# Patient Record
Sex: Male | Born: 1935
Health system: Southern US, Community
[De-identification: ages and names within clinical notes are randomized; demographics above are authoritative.]

## PROBLEM LIST (undated history)

## (undated) DIAGNOSIS — I8390 Asymptomatic varicose veins of unspecified lower extremity: Secondary | ICD-10-CM

## (undated) DIAGNOSIS — K6389 Other specified diseases of intestine: Secondary | ICD-10-CM

## (undated) DIAGNOSIS — I10 Essential (primary) hypertension: Secondary | ICD-10-CM

## (undated) DIAGNOSIS — I2584 Coronary atherosclerosis due to calcified coronary lesion: Secondary | ICD-10-CM

## (undated) DIAGNOSIS — C8590 Non-Hodgkin lymphoma, unspecified, unspecified site: Secondary | ICD-10-CM

## (undated) DIAGNOSIS — H9190 Unspecified hearing loss, unspecified ear: Secondary | ICD-10-CM

## (undated) DIAGNOSIS — E785 Hyperlipidemia, unspecified: Secondary | ICD-10-CM

## (undated) DIAGNOSIS — K219 Gastro-esophageal reflux disease without esophagitis: Secondary | ICD-10-CM

## (undated) DIAGNOSIS — I447 Left bundle-branch block, unspecified: Principal | ICD-10-CM

## (undated) DIAGNOSIS — I2510 Atherosclerotic heart disease of native coronary artery without angina pectoris: Secondary | ICD-10-CM

## (undated) DIAGNOSIS — J189 Pneumonia, unspecified organism: Secondary | ICD-10-CM

## (undated) HISTORY — DX: Hyperlipidemia, unspecified: E78.5

## (undated) HISTORY — DX: Non-Hodgkin lymphoma, unspecified, unspecified site: C85.90

## (undated) HISTORY — DX: Coronary atherosclerosis due to calcified coronary lesion: I25.84

## (undated) HISTORY — DX: Asymptomatic varicose veins of unspecified lower extremity: I83.90

## (undated) HISTORY — DX: Left bundle-branch block, unspecified: I44.7

## (undated) HISTORY — DX: Essential (primary) hypertension: I10

## (undated) HISTORY — DX: Atherosclerotic heart disease of native coronary artery without angina pectoris: I25.10

---

## 2006-08-16 HISTORY — PX: HERNIA REPAIR: SHX51

## 2006-12-06 ENCOUNTER — Observation Stay (HOSPITAL_COMMUNITY): Admission: EM | Admit: 2006-12-06 | Discharge: 2006-12-07 | Payer: Self-pay | Admitting: Emergency Medicine

## 2006-12-06 ENCOUNTER — Encounter (INDEPENDENT_AMBULATORY_CARE_PROVIDER_SITE_OTHER): Payer: Self-pay | Admitting: *Deleted

## 2007-03-14 ENCOUNTER — Ambulatory Visit (HOSPITAL_COMMUNITY): Admission: RE | Admit: 2007-03-14 | Discharge: 2007-03-15 | Payer: Self-pay | Admitting: General Surgery

## 2007-03-14 ENCOUNTER — Encounter (INDEPENDENT_AMBULATORY_CARE_PROVIDER_SITE_OTHER): Payer: Self-pay | Admitting: General Surgery

## 2010-12-29 NOTE — H&P (Signed)
NAME:  [name], [name] NO.:  [id number]   MEDICAL RECORD NO.:  [id number]          PATIENT TYPE:  OIB   LOCATION:  [place]                         FACILITY:  [facility]   PHYSICIAN:  Orson Ape. Weatherly, M.D.DATE OF BIRTH:  19-Dec-1931   DATE OF ADMISSION:  03/14/2007  DATE OF DISCHARGE:                              HISTORY & PHYSICAL   CHIEF COMPLAINT:  Large left inguinal hernia, I am sure it is a slider.  History of hypertension.   HISTORY:  Henry Nichols is a 77 year old Caucasian male, widowed, who  presented to the emergency room with a bowel obstruction secondary to a  giant right inguinal hernia on December 06, 2006.  The patient had a  football-sized hernia that was very tense and uncomfortable on the  right.  He has a similar size hernia that was not incarcerated on the  left, and I was able to reduce the hernia in the emergency room with IV  morphine and then took him to the operating room later that day and  repaired the right inguinal hernia, which had cecum, small bowel, etc,  all into a very large football-size hernia sac area.  The floor was  completely obstruction, of course.  He has had these, he said, for most  all of his life.  We recommended that he have the left side repaired  electively at a later time.   He has done satisfactorily.  He is back caring for himself.  He is  widowed.  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   His chronic medications are unchanged.  He is on atenolol plus, a half  tablet a day for mild blood pressure.  Crestor for cholesterol, and a  baby aspirin.   He has had no previous hospitalization with the exception of the right  inguinal hernia that was repaired.   FAMILY HISTORY:  Father is deceased.  His mother has had bypass surgery,  and she is still living.  He has one sister living.   ALLERGIES:  He denies any.   PAST MEDICAL HISTORY:  Please refer to the old chart.  Nothing has  changed.   PHYSICAL EXAMINATION:   VITAL SIGNS:  Unremarkable.  He is an elderly male who appears his stated age but in no acute  distress.  He appears adequately hydrated.  LUNGS:  Clear.  CARDIAC:  Normal sinus rhythm.  ABDOMEN:  He has a lax abdominal wall.  It is soft.  There is no  evidence of an umbilical hernia.  He has a well-healed right inguinal  hernia incision.  I do not feel any evidence of a right inguinal hernia.  Skin on the scrotum is still large on the right.  On the left is an  obviously football-sized hernia that is all down into the scrotum.  I  could not reduce it in the preop area.  He can manually reduce it if he  pushes on it too hard, but we will reduce it in the operating room.  RECTAL:  Not done today.   IMPRESSION:  1. Large left inguinal hernia, probably a slider.  2. Mild hypertension.   PLAN:  We will repair the left inguinal hernia with general anesthesia.  I am going to put a Foley catheter.  His potassium is 2.9.  He had a  bottle of mag citrate yesterday and cleansing out his colon.  This  possibly contributed to that.  He is not a potassium supplement.  He has  also got a very slightly elevated bilirubin.  His other liver tests are  normal, and he has not had any imaging of his liver.           ______________________________  Orson Ape. [name], M.D.     WJW/MEDQ  D:  03/14/2007  T:  03/14/2007  Job:  [id number]

## 2010-12-29 NOTE — Op Note (Signed)
NAME:  Henry Nichols, Henry Nichols NO.:  [id number]   MEDICAL RECORD NO.:  [id number]          PATIENT TYPE:  OIB   LOCATION:  [place]                         FACILITY:  [facility]   PHYSICIAN:  Orson Ape. Weatherly, M.D.DATE OF BIRTH:  1931/12/07   DATE OF PROCEDURE:  03/14/2007  DATE OF DISCHARGE:                               OPERATIVE REPORT   PREOPERATIVE DIAGNOSIS:  Large left inguinal hernia, probably a slider.   POSTOPERATIVE DIAGNOSIS:  Large left inguinal hernia, a slider.   OPERATION:  Repair, left inguinal hernia.   General anesthesia.   SURGEON:  Orson Ape. [name], M.D., assisted by the nurse.   HISTORY:  Henry Nichols is a 77 year old male that has had bilateral  inguinal hernias most of his life.  He presented to the emergency room  in April with an incarceration of the right inguinal hernia that was  reduced and then repaired electively.  I elected to wait on the left  inguinal hernia until an elective time and he is here now for that  planned procedure.  He has had no guaiac-positive stools.  I do not  think he has had a colonoscopy, and he had a bottle of magnesium citrate  yesterday in preparation for the hernia repair.   Preoperatively was given a gram of Ancef, and plan on placing a Foley.  He was taken back to the operating room, induction of general anesthesia  and endotracheal tube.  The hernia, I have tried to reduce it with him  just asleep but it still not reducible, and we prepped the area widely  with Betadine solution and put a Foley catheter in the bladder.  The  area where the incision we made was clipped and it was then prepped and  draped in a sterile manner.  I used about 10 mL of Marcaine with  adrenalin and anesthetized the ilioinguinal nerve initially and then  made the inguinal incision.  With the large mass it is somewhat hard to  identify the actual symphysis pubis and certainly cannot feel the  inguinal ligament.  Sharp dissection  down through the skin and  subcutaneous tissue, everything is all distorted with this large hernia.  There were two superficial veins that we identified, clamped, ligated  with fine Vicryl, and then trying to get around the cord structures, the  inguinal ligament was identified and the external oblique.  I opened  this.  Still you could reduce the area and I went and dissected on down,  identifying the hernia sac, and then opened it and then there was  probably 2 feet of colon down into the hernia.  This is a definite  slider.  I then could get a Penrose drain around the cord structures and  then first we kind of separated the colon and the posterior hernia sac  so I could flip that intra-abdominally and then extraperitonealized the  vascular structures to the colon.  This then gave [place] an internal ring  area that was not all that large, and this was repaired with a running 2-  0 Vicryl, of course, everything open.  Next I repaired the floor with a  running 2-0 Prolene, kind of modified Shouldice repair, and then  recreated the internal ring and then took the area back down, tying the  two ends together.  A piece of Prolene mesh shaped like a sail slit  laterally was then used to reinforce the floor.  The inferior portion  was sutured with a running 2-0 Vicryl.  The two tails were sutured  together and I made sure that they did not actually constrict the cord  structures since it is such a large area.  Most the cremaster muscle  fibers and all had been taken down and hemostasis was obtained with 4-0  Vicryl ties for this.  The testicle was definitely down deep in the  scrotum at the completion of surgery.  The superior limb of the Prolene  mesh was sutured down with interrupted 2-0 Prolenes.  We were up under  the external oblique and the mesh is lying flat, not excessively tight.  Next the external oblique was closed with a running 2-0 Vicryl.  The  ilioinguinal nerve had been placed with  the cord structures.  The  iliohypogastric nerve I never visualized.  The Scarpa fascia was closed  with interrupted 3-0 Vicryl and 4-0 Dexon  subcuticular and Benzoin and Steri-Strips on the skin.  A sterile  occlusive dressing was applied.  We will keep the Foley in place and,  hopefully, the patient will be able to be discharged in the a.m.  I may  remove his Foley this evening.  I may wait until the morning with how  much pain he is having postoperatively.           ______________________________  Orson Ape. [name], M.D.     WJW/MEDQ  D:  03/14/2007  T:  03/15/2007  Job:  [id number]   cc:   Azalia Bilis, M.D.  Fax: [phone]

## 2011-01-01 NOTE — H&P (Signed)
NAME:  Henry, Nichols NO.:  [id number]   MEDICAL RECORD NO.:  [id number]          PATIENT TYPE:  INP   LOCATION:  0098                         FACILITY:  [facility]   PHYSICIAN:  Orson Ape. Weatherly, M.D.DATE OF BIRTH:  [birth year]   DATE OF ADMISSION:  12/06/2006  DATE OF DISCHARGE:                              HISTORY & PHYSICAL   CHIEF COMPLAINT:  Severe pain in the right groin.   HISTORY:  Henry Nichols is a 77 year old widowed Caucasian male who  came to the emergency room today with severe pain of several hours'  duration in the right scrotum.  The patient has had giant bilateral  hernias for many, many years.  His family have requested that he have  these surgically corrected.  The patient said he has just had a fear of  hospitalization and has not proceeded.  He sees some medical doctor, he  did not give me his name, but said that his medical doctor does not know  anything about his hernias.  He is on Atenolol for high blood pressure  and he has an elevated cholesterol.  The patient is retired; I am not  sure what type of work he used to do, but in the emergency room, he had  a small football-sized hernia that was very tense and very painful in  the right groin and I was called.  We gave him IV morphine and I was  able with manual pressure on the scrotum to get the right inguinal  hernia reduced.  His pain greatly decreased afterwards and then the left  inguinal hernia that is not quite as large, but of similar size, the  patient could reduce that one.  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  He went home and tried to reduce  it, was not able to reduce it and that is when he was brought to the  emergency room by his son.  The patient says he has never been in the  hospital where he has never been in as a patient and of course, he was  never had any surgery.   After the hernia was reduced, we of course had started IV  fluid.  I sent  him over for chest x-ray and a flat and upright abdominal film.  There  was a dilated loop of small bowel, but no free air and the patient was  basically essentially pain-free after the hernia was reduced.  His white  count in the emergency room was 18,100 and I recommended that we proceed  on with a right inguinal herniorrhaphy as an urgent patient and the  patient is in agreement.   CHRONIC MEDICATIONS:  1. Atenolol plus one-half tablet in the morning.  2. Crestor.  3. Baby aspirin each day.   SOCIAL HISTORY:  The patient is widowed and his son was with him in the  emergency room.   PHYSICAL EXAM:  VITAL SIGNS:  When the patient first presented, he was  afebrile at 97.3, pulse of 70, respirations 20, blood pressure 149/79.  GENERAL:  He is an elderly male who appears older than his stated age  and was in severe pain when he first presented.  Afterwards, he was kind  of jovial and not complaining of any pain.  LUNGS:  Clear.  CARDIAC:  Normal sinus rhythm.  BREASTS:  Negative.  ABDOMEN:  He is kind of full, but has bowel sounds.  He is not acutely  tender in the upper abdomen.  RECTAL:  I did not do a rectal exam on him.  GU:  He denies difficulty voiding.  EXTREMITIES:  There is no pedal edema.  CNS:  Grossly physiologic.   ADMISSION IMPRESSION:  1. Giant bilateral inguinal hernias; the right one was incarcerated,      but we reduced it; the left one is large, but he could manually      reduce it.  2. The patient has a history of mild hypertension.   PLAN:  We will proceed with a right inguinal hernia repair under general  anesthesia this evening.  I have talked with his sister that if he were  to have any ischemic bowel, we may have to do a laparotomy, but the way  his clinical findings are, I think that is unlikely.  I would like to  wait and do the left inguinal hernia after probably about 6 weeks to  give the right side evidence to heal, since he has  got such big inguinal  hernias.           ______________________________  Orson Ape. [name], M.D.     WJW/MEDQ  D:  12/06/2006  T:  12/07/2006  Job:  [id number]

## 2011-01-01 NOTE — Op Note (Signed)
NAME:  Henry Nichols, Henry Nichols NO.:  [id number]   MEDICAL RECORD NO.:  [id number]          PATIENT TYPE:  INP   LOCATION:  0098                         FACILITY:  [facility]   PHYSICIAN:  Orson Ape. Weatherly, M.D.DATE OF BIRTH:  11-26-1931   DATE OF PROCEDURE:  12/06/2006  DATE OF DISCHARGE:                               OPERATIVE REPORT   PREOPERATIVE DIAGNOSIS:  Bilateral inguinal hernia.  The right one was  incarcerated and I just previously reduced it.   OPERATION:  Right inguinal herniorrhaphy with mesh reinforcement.   ANESTHESIA:  General anesthesia.   HISTORY:  Henry Nichols is a 77 year old male who has had bilateral  giant hernias many, many years.  He has never had any surgery or  hospitalization.  The hernia became incarcerated today and he came to  the emergency room with his son.  I was able to manually reduce it with  some difficulty after giving him intravenous morphine.  The patient is  able to reduce the left inguinal hernia that is nearly of equal size and  I have recommended we repair the right inguinal hernia at this time and  then do the left inguinal hernia had a later time, hopefully about six  weeks from now.   Preop he was given a gram of Ancef and taken to the operative suite.  Induction of general anesthesia endotracheal tube, later oral tube was  placed in the stomach but there was little contents and the tumor was  removed afterwards.  I did place a Foley catheter and he has got good  urine output.  The left and right inguinal areas were shaved or clipped  and also the midline and then he was prepped widely with Betadine  solution and draped in a sterile manner.  The inguinal incision was made  through the skin and subcutaneous tissue and a couple of veins were  identified and these were clamped, ligated with fine Vicryl and then the  hernia sac which had really destroyed the external ring because it is so  big was kind of separated so I  could get a Penrose around the complete  structures.  Next the cremaster was opened and the hernia sac was  separated from the cord structures.  The sac was opened, the small bowel  was reduced and then the sac which was probably 8 inches or longer was  completely separated right on up to the internal ring and high sac  ligation was performed under direct vision with two sutures of 2-0 silk.  The hernia sac removed and retracted up in the preperitoneal space  nicely and then the floor which was reinforced with a 0-0 Prolene  starting at symphysis pubis, recreating the internal ring going back  tying the two ends together.  Next a piece of Prolene mesh shaped like a  sail slit laterally was used to reinforce the floor scar with the  inguinal ligament suturing it with another 0-0 Prolene.  The two tails  were sutured together laterally.  The  ilioinguinal nerve had been  protected and brought out with the cord structures.  The superior flap  was sutured down with interrupted sutures of 0-0 Prolene and also some 2-  0 Vicryl and then the external oblique was closed, kind of reefing it up  laterally where was so stretched partially creating an external ring.  Scarpa's fascia was closed with interrupted 2-0 Vicryl, 3-0 Vicryl, and  a 4-0 Dexon used subcuticular, Benzoin and Steri-Strips on the skin.  The testicle was in its normal position.  The scrotum was still swollen  but a lot less and hopefully he will not get testicular swelling.  I  went to the opposite side and kind of reduce the hernia.  It prolapses  back out with him straining as he is being extubated, but hopefully this  will not become incarcerated and we can repair it electively probably in  4-6 weeks.  We will leave the Foley catheter  in and we will reassess in the morning whether we can remove the Foley  or whether if he is having any symptoms might leave the Foley another 24  hours.  The patient lives alone but I think he is  going to say with his  son, though I want to make sure that he is doing satisfactory before he  is released.           ______________________________  Orson Ape. [name], M.D.     WJW/MEDQ  D:  12/06/2006  T:  12/07/2006  Job:  [id number]

## 2011-05-31 LAB — COMPREHENSIVE METABOLIC PANEL
ALT: 23
AST: 29
Albumin: 2.9 — ABNORMAL LOW
Albumin: 3.9
Alkaline Phosphatase: 54
BUN: 14
BUN: 9
Calcium: 8.3 — ABNORMAL LOW
Chloride: 98
Creatinine, Ser: 1.19
Glucose, Bld: 158 — ABNORMAL HIGH
Potassium: 2.9 — ABNORMAL LOW
Sodium: 141
Total Bilirubin: 2.3 — ABNORMAL HIGH
Total Protein: 5.7 — ABNORMAL LOW

## 2011-05-31 LAB — DIFFERENTIAL
Basophils Relative: 1
Eosinophils Absolute: 0.7
Eosinophils Relative: 9 — ABNORMAL HIGH
Lymphs Abs: 3.4 — ABNORMAL HIGH
Monocytes Absolute: 0.6
Monocytes Relative: 7
Neutrophils Relative %: 40 — ABNORMAL LOW

## 2011-05-31 LAB — CBC
Hemoglobin: 13.3
RDW: 13.6

## 2011-12-02 DIAGNOSIS — L259 Unspecified contact dermatitis, unspecified cause: Secondary | ICD-10-CM | POA: Diagnosis not present

## 2011-12-02 DIAGNOSIS — Z125 Encounter for screening for malignant neoplasm of prostate: Secondary | ICD-10-CM | POA: Diagnosis not present

## 2011-12-02 DIAGNOSIS — J309 Allergic rhinitis, unspecified: Secondary | ICD-10-CM | POA: Diagnosis not present

## 2011-12-02 DIAGNOSIS — E785 Hyperlipidemia, unspecified: Secondary | ICD-10-CM | POA: Diagnosis not present

## 2011-12-02 DIAGNOSIS — I10 Essential (primary) hypertension: Secondary | ICD-10-CM | POA: Diagnosis not present

## 2012-06-01 DIAGNOSIS — I8310 Varicose veins of unspecified lower extremity with inflammation: Secondary | ICD-10-CM | POA: Diagnosis not present

## 2012-06-01 DIAGNOSIS — I10 Essential (primary) hypertension: Secondary | ICD-10-CM | POA: Diagnosis not present

## 2012-06-01 DIAGNOSIS — Z23 Encounter for immunization: Secondary | ICD-10-CM | POA: Diagnosis not present

## 2012-06-01 DIAGNOSIS — J309 Allergic rhinitis, unspecified: Secondary | ICD-10-CM | POA: Diagnosis not present

## 2012-06-01 DIAGNOSIS — E785 Hyperlipidemia, unspecified: Secondary | ICD-10-CM | POA: Diagnosis not present

## 2012-11-30 DIAGNOSIS — J309 Allergic rhinitis, unspecified: Secondary | ICD-10-CM | POA: Diagnosis not present

## 2012-11-30 DIAGNOSIS — I10 Essential (primary) hypertension: Secondary | ICD-10-CM | POA: Diagnosis not present

## 2012-11-30 DIAGNOSIS — E785 Hyperlipidemia, unspecified: Secondary | ICD-10-CM | POA: Diagnosis not present

## 2013-06-01 DIAGNOSIS — J309 Allergic rhinitis, unspecified: Secondary | ICD-10-CM | POA: Diagnosis not present

## 2013-06-01 DIAGNOSIS — Z23 Encounter for immunization: Secondary | ICD-10-CM | POA: Diagnosis not present

## 2013-06-01 DIAGNOSIS — I10 Essential (primary) hypertension: Secondary | ICD-10-CM | POA: Diagnosis not present

## 2013-06-01 DIAGNOSIS — J449 Chronic obstructive pulmonary disease, unspecified: Secondary | ICD-10-CM | POA: Diagnosis not present

## 2013-06-01 DIAGNOSIS — H547 Unspecified visual loss: Secondary | ICD-10-CM | POA: Diagnosis not present

## 2013-06-01 DIAGNOSIS — E785 Hyperlipidemia, unspecified: Secondary | ICD-10-CM | POA: Diagnosis not present

## 2013-11-30 DIAGNOSIS — Z23 Encounter for immunization: Secondary | ICD-10-CM | POA: Diagnosis not present

## 2013-11-30 DIAGNOSIS — I10 Essential (primary) hypertension: Secondary | ICD-10-CM | POA: Diagnosis not present

## 2013-11-30 DIAGNOSIS — J449 Chronic obstructive pulmonary disease, unspecified: Secondary | ICD-10-CM | POA: Diagnosis not present

## 2013-11-30 DIAGNOSIS — J309 Allergic rhinitis, unspecified: Secondary | ICD-10-CM | POA: Diagnosis not present

## 2013-11-30 DIAGNOSIS — E785 Hyperlipidemia, unspecified: Secondary | ICD-10-CM | POA: Diagnosis not present

## 2014-02-06 DIAGNOSIS — H18419 Arcus senilis, unspecified eye: Secondary | ICD-10-CM | POA: Diagnosis not present

## 2014-02-06 DIAGNOSIS — H25049 Posterior subcapsular polar age-related cataract, unspecified eye: Secondary | ICD-10-CM | POA: Diagnosis not present

## 2014-02-06 DIAGNOSIS — H2510 Age-related nuclear cataract, unspecified eye: Secondary | ICD-10-CM | POA: Diagnosis not present

## 2014-02-06 DIAGNOSIS — H25019 Cortical age-related cataract, unspecified eye: Secondary | ICD-10-CM | POA: Diagnosis not present

## 2014-03-05 DIAGNOSIS — H2510 Age-related nuclear cataract, unspecified eye: Secondary | ICD-10-CM | POA: Diagnosis not present

## 2014-03-05 DIAGNOSIS — H269 Unspecified cataract: Secondary | ICD-10-CM | POA: Diagnosis not present

## 2014-03-06 DIAGNOSIS — H2510 Age-related nuclear cataract, unspecified eye: Secondary | ICD-10-CM | POA: Diagnosis not present

## 2014-03-12 DIAGNOSIS — H2510 Age-related nuclear cataract, unspecified eye: Secondary | ICD-10-CM | POA: Diagnosis not present

## 2014-03-12 DIAGNOSIS — H269 Unspecified cataract: Secondary | ICD-10-CM | POA: Diagnosis not present

## 2014-05-31 DIAGNOSIS — J449 Chronic obstructive pulmonary disease, unspecified: Secondary | ICD-10-CM | POA: Diagnosis not present

## 2014-05-31 DIAGNOSIS — J309 Allergic rhinitis, unspecified: Secondary | ICD-10-CM | POA: Diagnosis not present

## 2014-05-31 DIAGNOSIS — Z23 Encounter for immunization: Secondary | ICD-10-CM | POA: Diagnosis not present

## 2014-05-31 DIAGNOSIS — E780 Pure hypercholesterolemia: Secondary | ICD-10-CM | POA: Diagnosis not present

## 2014-05-31 DIAGNOSIS — I10 Essential (primary) hypertension: Secondary | ICD-10-CM | POA: Diagnosis not present

## 2014-08-16 HISTORY — PX: EYE SURGERY: SHX253

## 2014-08-23 DIAGNOSIS — N390 Urinary tract infection, site not specified: Secondary | ICD-10-CM | POA: Diagnosis not present

## 2014-12-04 DIAGNOSIS — J309 Allergic rhinitis, unspecified: Secondary | ICD-10-CM | POA: Diagnosis not present

## 2014-12-04 DIAGNOSIS — I10 Essential (primary) hypertension: Secondary | ICD-10-CM | POA: Diagnosis not present

## 2014-12-04 DIAGNOSIS — J449 Chronic obstructive pulmonary disease, unspecified: Secondary | ICD-10-CM | POA: Diagnosis not present

## 2014-12-04 DIAGNOSIS — E780 Pure hypercholesterolemia: Secondary | ICD-10-CM | POA: Diagnosis not present

## 2015-06-04 DIAGNOSIS — H353132 Nonexudative age-related macular degeneration, bilateral, intermediate dry stage: Secondary | ICD-10-CM | POA: Diagnosis not present

## 2015-06-05 DIAGNOSIS — J309 Allergic rhinitis, unspecified: Secondary | ICD-10-CM | POA: Diagnosis not present

## 2015-06-05 DIAGNOSIS — I10 Essential (primary) hypertension: Secondary | ICD-10-CM | POA: Diagnosis not present

## 2015-06-05 DIAGNOSIS — Z23 Encounter for immunization: Secondary | ICD-10-CM | POA: Diagnosis not present

## 2015-06-05 DIAGNOSIS — J449 Chronic obstructive pulmonary disease, unspecified: Secondary | ICD-10-CM | POA: Diagnosis not present

## 2015-06-05 DIAGNOSIS — E786 Lipoprotein deficiency: Secondary | ICD-10-CM | POA: Diagnosis not present

## 2015-06-05 DIAGNOSIS — L309 Dermatitis, unspecified: Secondary | ICD-10-CM | POA: Diagnosis not present

## 2015-08-14 DIAGNOSIS — H353131 Nonexudative age-related macular degeneration, bilateral, early dry stage: Secondary | ICD-10-CM | POA: Diagnosis not present

## 2015-08-14 DIAGNOSIS — H26499 Other secondary cataract, unspecified eye: Secondary | ICD-10-CM | POA: Diagnosis not present

## 2015-09-29 DIAGNOSIS — H26491 Other secondary cataract, right eye: Secondary | ICD-10-CM | POA: Diagnosis not present

## 2015-10-02 ENCOUNTER — Encounter (INDEPENDENT_AMBULATORY_CARE_PROVIDER_SITE_OTHER): Payer: Medicare Other | Admitting: Ophthalmology

## 2015-10-02 DIAGNOSIS — H26492 Other secondary cataract, left eye: Secondary | ICD-10-CM

## 2015-10-02 DIAGNOSIS — H35033 Hypertensive retinopathy, bilateral: Secondary | ICD-10-CM | POA: Diagnosis not present

## 2015-10-02 DIAGNOSIS — H43813 Vitreous degeneration, bilateral: Secondary | ICD-10-CM | POA: Diagnosis not present

## 2015-10-02 DIAGNOSIS — I10 Essential (primary) hypertension: Secondary | ICD-10-CM

## 2015-10-02 DIAGNOSIS — H353132 Nonexudative age-related macular degeneration, bilateral, intermediate dry stage: Secondary | ICD-10-CM

## 2015-10-17 ENCOUNTER — Ambulatory Visit (INDEPENDENT_AMBULATORY_CARE_PROVIDER_SITE_OTHER): Payer: Medicare Other | Admitting: Ophthalmology

## 2015-10-17 DIAGNOSIS — H2702 Aphakia, left eye: Secondary | ICD-10-CM

## 2015-12-05 DIAGNOSIS — E7800 Pure hypercholesterolemia, unspecified: Secondary | ICD-10-CM | POA: Diagnosis not present

## 2015-12-05 DIAGNOSIS — I10 Essential (primary) hypertension: Secondary | ICD-10-CM | POA: Diagnosis not present

## 2015-12-05 DIAGNOSIS — L309 Dermatitis, unspecified: Secondary | ICD-10-CM | POA: Diagnosis not present

## 2015-12-05 DIAGNOSIS — J309 Allergic rhinitis, unspecified: Secondary | ICD-10-CM | POA: Diagnosis not present

## 2015-12-05 DIAGNOSIS — J449 Chronic obstructive pulmonary disease, unspecified: Secondary | ICD-10-CM | POA: Diagnosis not present

## 2015-12-11 DIAGNOSIS — E7800 Pure hypercholesterolemia, unspecified: Secondary | ICD-10-CM | POA: Diagnosis not present

## 2015-12-11 DIAGNOSIS — I10 Essential (primary) hypertension: Secondary | ICD-10-CM | POA: Diagnosis not present

## 2016-01-20 DIAGNOSIS — N183 Chronic kidney disease, stage 3 (moderate): Secondary | ICD-10-CM | POA: Diagnosis not present

## 2016-06-07 DIAGNOSIS — J309 Allergic rhinitis, unspecified: Secondary | ICD-10-CM | POA: Diagnosis not present

## 2016-06-07 DIAGNOSIS — L309 Dermatitis, unspecified: Secondary | ICD-10-CM | POA: Diagnosis not present

## 2016-06-07 DIAGNOSIS — E7800 Pure hypercholesterolemia, unspecified: Secondary | ICD-10-CM | POA: Diagnosis not present

## 2016-06-07 DIAGNOSIS — Z23 Encounter for immunization: Secondary | ICD-10-CM | POA: Diagnosis not present

## 2016-06-07 DIAGNOSIS — J449 Chronic obstructive pulmonary disease, unspecified: Secondary | ICD-10-CM | POA: Diagnosis not present

## 2016-06-07 DIAGNOSIS — N183 Chronic kidney disease, stage 3 (moderate): Secondary | ICD-10-CM | POA: Diagnosis not present

## 2016-06-07 DIAGNOSIS — I10 Essential (primary) hypertension: Secondary | ICD-10-CM | POA: Diagnosis not present

## 2016-10-19 ENCOUNTER — Ambulatory Visit (INDEPENDENT_AMBULATORY_CARE_PROVIDER_SITE_OTHER): Payer: Medicare Other | Admitting: Ophthalmology

## 2016-10-19 DIAGNOSIS — I10 Essential (primary) hypertension: Secondary | ICD-10-CM | POA: Diagnosis not present

## 2016-10-19 DIAGNOSIS — H35371 Puckering of macula, right eye: Secondary | ICD-10-CM

## 2016-10-19 DIAGNOSIS — H35033 Hypertensive retinopathy, bilateral: Secondary | ICD-10-CM | POA: Diagnosis not present

## 2016-10-19 DIAGNOSIS — H353131 Nonexudative age-related macular degeneration, bilateral, early dry stage: Secondary | ICD-10-CM | POA: Diagnosis not present

## 2016-10-19 DIAGNOSIS — H43813 Vitreous degeneration, bilateral: Secondary | ICD-10-CM | POA: Diagnosis not present

## 2016-12-06 DIAGNOSIS — I872 Venous insufficiency (chronic) (peripheral): Secondary | ICD-10-CM | POA: Diagnosis not present

## 2016-12-06 DIAGNOSIS — J309 Allergic rhinitis, unspecified: Secondary | ICD-10-CM | POA: Diagnosis not present

## 2016-12-06 DIAGNOSIS — I10 Essential (primary) hypertension: Secondary | ICD-10-CM | POA: Diagnosis not present

## 2016-12-06 DIAGNOSIS — R600 Localized edema: Secondary | ICD-10-CM | POA: Diagnosis not present

## 2016-12-06 DIAGNOSIS — J449 Chronic obstructive pulmonary disease, unspecified: Secondary | ICD-10-CM | POA: Diagnosis not present

## 2016-12-06 DIAGNOSIS — E7800 Pure hypercholesterolemia, unspecified: Secondary | ICD-10-CM | POA: Diagnosis not present

## 2017-03-14 DIAGNOSIS — N390 Urinary tract infection, site not specified: Secondary | ICD-10-CM | POA: Diagnosis not present

## 2017-03-14 DIAGNOSIS — K529 Noninfective gastroenteritis and colitis, unspecified: Secondary | ICD-10-CM | POA: Diagnosis not present

## 2017-03-14 DIAGNOSIS — R1013 Epigastric pain: Secondary | ICD-10-CM | POA: Diagnosis not present

## 2017-03-28 DIAGNOSIS — N289 Disorder of kidney and ureter, unspecified: Secondary | ICD-10-CM | POA: Diagnosis not present

## 2017-06-08 ENCOUNTER — Other Ambulatory Visit: Payer: Self-pay | Admitting: Family Medicine

## 2017-06-08 DIAGNOSIS — I872 Venous insufficiency (chronic) (peripheral): Secondary | ICD-10-CM | POA: Diagnosis not present

## 2017-06-08 DIAGNOSIS — E7800 Pure hypercholesterolemia, unspecified: Secondary | ICD-10-CM | POA: Diagnosis not present

## 2017-06-08 DIAGNOSIS — J449 Chronic obstructive pulmonary disease, unspecified: Secondary | ICD-10-CM | POA: Diagnosis not present

## 2017-06-08 DIAGNOSIS — J309 Allergic rhinitis, unspecified: Secondary | ICD-10-CM | POA: Diagnosis not present

## 2017-06-08 DIAGNOSIS — I10 Essential (primary) hypertension: Secondary | ICD-10-CM | POA: Diagnosis not present

## 2017-06-08 DIAGNOSIS — R600 Localized edema: Secondary | ICD-10-CM | POA: Diagnosis not present

## 2017-06-08 DIAGNOSIS — R1013 Epigastric pain: Secondary | ICD-10-CM | POA: Diagnosis not present

## 2017-06-08 DIAGNOSIS — Z1389 Encounter for screening for other disorder: Secondary | ICD-10-CM | POA: Diagnosis not present

## 2017-06-08 DIAGNOSIS — Z23 Encounter for immunization: Secondary | ICD-10-CM | POA: Diagnosis not present

## 2017-06-09 ENCOUNTER — Ambulatory Visit
Admission: RE | Admit: 2017-06-09 | Discharge: 2017-06-09 | Disposition: A | Discharge status: Home / Self Care | Payer: Medicare Other | Source: Ambulatory Visit | Attending: Family Medicine | Admitting: Family Medicine

## 2017-06-09 ENCOUNTER — Other Ambulatory Visit: Payer: Self-pay | Admitting: Family Medicine

## 2017-06-09 DIAGNOSIS — R1013 Epigastric pain: Secondary | ICD-10-CM

## 2017-06-09 DIAGNOSIS — K8020 Calculus of gallbladder without cholecystitis without obstruction: Secondary | ICD-10-CM | POA: Diagnosis not present

## 2017-06-09 HISTORY — DX: Essential (primary) hypertension: I10

## 2017-06-09 MED ORDER — IOPAMIDOL (ISOVUE-300) INJECTION 61%
100.0000 mL | Freq: Once | INTRAVENOUS | Status: AC | PRN
  Administered 2017-06-09: 100 mL via INTRAVENOUS

## 2017-06-13 ENCOUNTER — Other Ambulatory Visit: Payer: Self-pay | Admitting: Family Medicine

## 2017-06-13 DIAGNOSIS — R935 Abnormal findings on diagnostic imaging of other abdominal regions, including retroperitoneum: Secondary | ICD-10-CM

## 2017-06-14 ENCOUNTER — Ambulatory Visit
Admission: RE | Admit: 2017-06-14 | Discharge: 2017-06-14 | Disposition: A | Discharge status: Home / Self Care | Payer: Medicare Other | Source: Ambulatory Visit | Attending: Family Medicine | Admitting: Family Medicine

## 2017-06-14 DIAGNOSIS — R935 Abnormal findings on diagnostic imaging of other abdominal regions, including retroperitoneum: Secondary | ICD-10-CM

## 2017-06-14 DIAGNOSIS — R918 Other nonspecific abnormal finding of lung field: Secondary | ICD-10-CM | POA: Diagnosis not present

## 2017-06-14 MED ORDER — IOPAMIDOL (ISOVUE-300) INJECTION 61%
75.0000 mL | Freq: Once | INTRAVENOUS | Status: AC | PRN
  Administered 2017-06-14: 75 mL via INTRAVENOUS

## 2017-06-27 ENCOUNTER — Other Ambulatory Visit: Payer: Self-pay | Admitting: Surgery

## 2017-06-27 DIAGNOSIS — K6389 Other specified diseases of intestine: Secondary | ICD-10-CM | POA: Diagnosis not present

## 2017-06-28 NOTE — Pre-Procedure Instructions (Signed)
Henry Nichols  06/28/2017      [facility] - [address] AT [facility] OF [address] [place] [address] Phone: [phone] Fax: [phone]    Your procedure is scheduled on July 06, 2017.  Report to [facility] Admitting at 1030 AM.  Call this number if you have problems the morning of surgery:  [phone]   Remember:  Do not eat food or drink liquids after midnight.  Take these medicines the morning of surgery with A SIP OF WATER albuterol inhaler (bring inhaler with you), pantoprazole (protonix).    7 days prior to surgery STOP taking any Aspirin (unless otherwise instructed by your surgeon), Aleve, Naproxen, Ibuprofen, Motrin, Advil, Goody's, BC's, all herbal medications, fish oil, and all vitamins  Continue all other medications as instructed by your physician except follow the above medication instructions before surgery   Do not wear jewelry.  Do not wear lotions, powders, or colognes, or deodorant.   Men may shave face and neck.  Do not bring valuables to the hospital.  [facility] is not responsible for any belongings or valuables.  Contacts, dentures or bridgework may not be worn into surgery.  Leave your suitcase in the car.  After surgery it may be brought to your room.  For patients admitted to the hospital, discharge time will be determined by your treatment team.  Patients discharged the day of surgery will not be allowed to drive home.   Special instructions:   [place]- Preparing For Surgery  Before surgery, you can play an important role. Because skin is not sterile, your skin needs to be as free of germs as possible. You can reduce the number of germs on your skin by washing with CHG (chlorahexidine gluconate) Soap before surgery.  CHG is an antiseptic cleaner which kills germs and bonds with the skin to continue killing germs even after washing.  Please do not use  if you have an allergy to CHG or antibacterial soaps. If your skin becomes reddened/irritated stop using the CHG.  Do not shave (including legs and underarms) for at least 48 hours prior to first CHG shower. It is OK to shave your face.  Please follow these instructions carefully.   1. Shower the NIGHT BEFORE SURGERY and the MORNING OF SURGERY with CHG.   2. If you chose to wash your hair, wash your hair first as usual with your normal shampoo.  3. After you shampoo, rinse your hair and body thoroughly to remove the shampoo.  4. Use CHG as you would any other liquid soap. You can apply CHG directly to the skin and wash gently with a scrungie or a clean washcloth.   5. Apply the CHG Soap to your body ONLY FROM THE NECK DOWN.  Do not use on open wounds or open sores. Avoid contact with your eyes, ears, mouth and genitals (private parts). Wash Face and genitals (private parts)  with your normal soap.  6. Wash thoroughly, paying special attention to the area where your surgery will be performed.  7. Thoroughly rinse your body with warm water from the neck down.  8. DO NOT shower/wash with your normal soap after using and rinsing off the CHG Soap.  9. Pat yourself dry with a CLEAN TOWEL.  10. Wear CLEAN PAJAMAS to bed the night before surgery, wear comfortable clothes the morning of surgery  11. Place CLEAN SHEETS  on your bed the night of your first shower and DO NOT SLEEP WITH PETS.    Day of Surgery: Do not apply any deodorants/lotions. Please wear clean clothes to the hospital/surgery center.     Please read over the following fact sheets that you were given. Pain Booklet, Coughing and Deep Breathing and Surgical Site Infection Prevention

## 2017-06-29 ENCOUNTER — Encounter (HOSPITAL_COMMUNITY): Payer: Self-pay

## 2017-06-29 ENCOUNTER — Encounter (HOSPITAL_COMMUNITY)
Admission: RE | Admit: 2017-06-29 | Discharge: 2017-06-29 | Disposition: A | Discharge status: Home / Self Care | Payer: Medicare Other | Source: Ambulatory Visit | Attending: Surgery | Admitting: Surgery

## 2017-06-29 ENCOUNTER — Other Ambulatory Visit: Payer: Self-pay

## 2017-06-29 DIAGNOSIS — K639 Disease of intestine, unspecified: Secondary | ICD-10-CM | POA: Insufficient documentation

## 2017-06-29 DIAGNOSIS — I10 Essential (primary) hypertension: Secondary | ICD-10-CM | POA: Insufficient documentation

## 2017-06-29 DIAGNOSIS — I447 Left bundle-branch block, unspecified: Secondary | ICD-10-CM | POA: Diagnosis not present

## 2017-06-29 DIAGNOSIS — Z8701 Personal history of pneumonia (recurrent): Secondary | ICD-10-CM | POA: Diagnosis not present

## 2017-06-29 DIAGNOSIS — Z01812 Encounter for preprocedural laboratory examination: Secondary | ICD-10-CM | POA: Diagnosis not present

## 2017-06-29 DIAGNOSIS — K219 Gastro-esophageal reflux disease without esophagitis: Secondary | ICD-10-CM | POA: Insufficient documentation

## 2017-06-29 DIAGNOSIS — Z01810 Encounter for preprocedural cardiovascular examination: Secondary | ICD-10-CM | POA: Insufficient documentation

## 2017-06-29 HISTORY — DX: Gastro-esophageal reflux disease without esophagitis: K21.9

## 2017-06-29 HISTORY — DX: Pneumonia, unspecified organism: J18.9

## 2017-06-29 LAB — CBC
HCT: 34.7 % — ABNORMAL LOW (ref 39.0–52.0)
Hemoglobin: 11.4 g/dL — ABNORMAL LOW (ref 13.0–17.0)
MCH: 31.1 pg (ref 26.0–34.0)
MCHC: 32.9 g/dL (ref 30.0–36.0)
MCV: 94.6 fL (ref 78.0–100.0)
PLATELETS: 207 10*3/uL (ref 150–400)
RBC: 3.67 MIL/uL — ABNORMAL LOW (ref 4.22–5.81)
RDW: 14.2 % (ref 11.5–15.5)
WBC: 4.3 10*3/uL (ref 4.0–10.5)

## 2017-06-29 LAB — BASIC METABOLIC PANEL
Anion gap: 7 (ref 5–15)
BUN: 12 mg/dL (ref 6–20)
CALCIUM: 8.9 mg/dL (ref 8.9–10.3)
CO2: 28 mmol/L (ref 22–32)
CREATININE: 1.14 mg/dL (ref 0.61–1.24)
Chloride: 103 mmol/L (ref 101–111)
GFR, EST NON AFRICAN AMERICAN: 58 mL/min — AB (ref >60)
Glucose, Bld: 96 mg/dL (ref 65–99)
Potassium: 3.5 mmol/L (ref 3.5–5.1)
SODIUM: 138 mmol/L (ref 135–145)

## 2017-06-29 LAB — TYPE AND SCREEN
ABO/RH(D): A POS
ANTIBODY SCREEN: NEGATIVE

## 2017-06-29 LAB — ABO/RH: ABO/RH(D): A POS

## 2017-06-29 NOTE — Progress Notes (Addendum)
Office called re: need for bowel prep. Spoke with shermia/sylvia- inst given from short stay per office also asked about eras orders. Office to call if needed.antibiotics to be called to pharmacy by office.

## 2017-06-29 NOTE — Pre-Procedure Instructions (Addendum)
Henry Nichols  06/29/2017      [facility] [address], [address] AT [facility] OF [address] [place] [address] Phone: [phone] Fax: [phone]    Your procedure is scheduled on July 06, 2017.  Report to [facility] Admitting at 1030 AM.  Call this number if you have problems the morning of surgery:  [phone]   Remember:  Do not eat food or drink liquids after midnight.  Take these medicines the morning of surgery with A SIP OF WATER albuterol inhaler (bring inhaler with you), pantoprazole (protonix). Atenolol-hctz(tenoretic)   7 days prior to surgery 06/29/17 STOP taking any Aspirin (unless otherwise instructed by your surgeon), Aleve, Naproxen, Ibuprofen, Motrin, Advil, Goody's, BC's, all herbal medications, fish oil, and all vitamins  Continue all other medications as instructed by your physician except follow the above medication instructions before surgery   Do not wear jewelry.  Do not wear lotions, powders, or colognes, or deodorant.   Men may shave face and neck.  Do not bring valuables to the hospital.  [facility] is not responsible for any belongings or valuables.  Contacts, dentures or bridgework may not be worn into surgery.  Leave your suitcase in the car.  After surgery it may be brought to your room.  For patients admitted to the hospital, discharge time will be determined by your treatment team.  Patients discharged the day of surgery will not be allowed to drive home.   Special instructions:   [place]- Preparing For Surgery  Before surgery, you can play an important role. Because skin is not sterile, your skin needs to be as free of germs as possible. You can reduce the number of germs on your skin by washing with CHG (chlorahexidine gluconate) Soap before surgery.  CHG is an antiseptic cleaner which kills germs and bonds with the skin to continue killing germs even  after washing.  Please do not use if you have an allergy to CHG or antibacterial soaps. If your skin becomes reddened/irritated stop using the CHG.  Do not shave (including legs and underarms) for at least 48 hours prior to first CHG shower. It is OK to shave your face.  Please follow these instructions carefully.   1. Shower the NIGHT BEFORE SURGERY and the MORNING OF SURGERY with CHG.   2. If you chose to wash your hair, wash your hair first as usual with your normal shampoo.  3. After you shampoo, rinse your hair and body thoroughly to remove the shampoo.  4. Use CHG as you would any other liquid soap. You can apply CHG directly to the skin and wash gently with a scrungie or a clean washcloth.   5. Apply the CHG Soap to your body ONLY FROM THE NECK DOWN.  Do not use on open wounds or open sores. Avoid contact with your eyes, ears, mouth and genitals (private parts). Wash Face and genitals (private parts)  with your normal soap.  6. Wash thoroughly, paying special attention to the area where your surgery will be performed.  7. Thoroughly rinse your body with warm water from the neck down.  8. DO NOT shower/wash with your normal soap after using and rinsing off the CHG Soap.  9. Pat yourself dry with a CLEAN TOWEL.  10. Wear CLEAN PAJAMAS to bed the night before surgery, wear comfortable clothes the morning of surgery  11. Place CLEAN  SHEETS on your bed the night of your first shower and DO NOT SLEEP WITH PETS.    Day of Surgery: Do not apply any deodorants/lotions. Please wear clean clothes to the hospital/surgery center.     Please read over the following fact sheets that you were given. Pain Booklet, Coughing and Deep Breathing and Surgical Site Infection Prevention

## 2017-06-30 NOTE — Progress Notes (Signed)
Anesthesia Chart Review:  Pt is an 83 year old male scheduled for laparoscopic assisted small bowel resection on 07/06/2017 with Coralie Keens, MD  - PCP is [name], MD  PMH includes:  LBBB, HTN, GERD. Former smoker. BMI 20  Medications include: Albuterol, ASA 81 mg, atenolol-chlorthalidone, Protonix, potassium, simvastatin  BP (!) 118/53   Pulse (!) 51   Temp 36.6 C   Resp 20   Ht 5\' 10"  (1.778 m)   Wt 138 lb 4.8 oz (62.7 kg)   SpO2 100%   BMI 19.84 kg/m   Preoperative labs reviewed.    CT 06/14/17:  1. Bilateral pulmonary parenchymal opacities, peribronchovascular in distribution. Favor infection or (especially given the clinical history of vomiting) aspiration. Neoplastic processes are felt unlikely but cannot be entirely excluded. Especially if the patient is diagnosed with lymphoma, consider CT followup after therapy to confirm resolution. 2. No thoracic adenopathy. 3. Mild cardiomegaly. Coronary artery atherosclerosis. Aortic Atherosclerosis (ICD10-I70.0). 4. Development of small bowel dilatation, suspicious for obstruction at the site of distal small bowel thickening on prior exam. 5. Borderline ascending aortic aneurysm. Recommend annual imaging followup by CTA or MRA.  EKG 06/29/17: Sinus bradycardia (49 bpm). LBBB. noise artifact limits interpretation. Appears stable when compared to EKG 03/14/07  If no changes, I anticipate pt can proceed with surgery as scheduled.   Willeen Cass, FNP-BC [facility]/Anesthesiology Phone: [phone] 06/30/2017 6:09 PM

## 2017-07-05 NOTE — H&P (Signed)
Henry Nichols 06/27/2017 2:54 PM Location: [facility] Patient #: [id number] DOB: 1932-07-17 Undefined / Language: Cleophus Molt / Race: White Male   History of Present Illness Henry Canary A. [name] MD; 06/27/2017 3:10 PM) The patient is a 83 year old male who presents with abdominal pain. This gentleman is referred to me by Dr. Arta Silence and Dr. [name] for evaluation of a small bowel mass. The patient has been having intermittent epigastric abdominal pain with vomiting and constipation as well as weight loss over the past several months. He has undergone a CT scan showing a short segment of circumferential wall thickening which is concerning for a potential malignancy or lymphoma. There is an adjacent soft tissue mass concerning for metastatic adenopathy on the films. Today, he reports he is pain-free. He will become bloated and we'll throw up approximately every 3 days. He moved his bowels yesterday. He reports weight loss. He has no chest pain or shortness of breath.   Past Surgical History Henry Nichols, CMA; 06/27/2017 2:59 PM) Cataract Surgery  Bilateral. Laparoscopic Inguinal Hernia Surgery  Bilateral.  Allergies Henry Nichols, CMA; 06/27/2017 2:55 PM) No Known Drug Allergies 06/27/2017  Medication History Henry Nichols, CMA; 06/27/2017 2:56 PM) Atenolol-Chlorthalidone (100-25MG  Tablet, Oral) Active. Pantoprazole Sodium (40MG  Tablet DR, Oral) Active. Potassium Chloride Crys ER (20MEQ Tablet ER, Oral) Active. Simvastatin (20MG  Tablet, Oral) Active. PreserVision AREDS 2 (Oral) Active. Aspirin (81MG  Tablet, Oral) Active. Medications Reconciled  Social History Henry Nichols, CMA; 06/27/2017 2:59 PM) Caffeine use  Carbonated beverages. No alcohol use  No drug use  Tobacco use  Former smoker.  Family History Henry Nichols, CMA; 06/27/2017 2:59 PM) Alcohol Abuse  Daughter.  Other Problems Henry Nichols, CMA; 06/27/2017 2:59  PM) High blood pressure  Hypercholesterolemia  Inguinal Hernia     Review of Systems (Henry Nichols CMA; 06/27/2017 2:59 PM) General Present- Chills and Weight Loss. Not Present- Appetite Loss, Fatigue, Fever, Night Sweats and Weight Gain. Skin Present- Dryness. Not Present- Change in Wart/Mole, Hives, Jaundice, New Lesions, Non-Healing Wounds, Rash and Ulcer. HEENT Present- Hearing Loss, Seasonal Allergies and Wears glasses/contact lenses. Not Present- Earache, Hoarseness, Nose Bleed, Oral Ulcers, Ringing in the Ears, Sinus Pain, Sore Throat, Visual Disturbances and Yellow Eyes. Gastrointestinal Present- Change in Bowel Habits, Gets full quickly at meals, Nausea and Vomiting. Not Present- Abdominal Pain, Bloating, Bloody Stool, Chronic diarrhea, Constipation, Difficulty Swallowing, Excessive gas, Hemorrhoids, Indigestion and Rectal Pain. Male Genitourinary Present- Change in Urinary Stream, Frequency and Nocturia. Not Present- Blood in Urine, Impotence, Painful Urination, Urgency and Urine Leakage. Musculoskeletal Present- Swelling of Extremities. Not Present- Back Pain, Joint Pain, Joint Stiffness, Muscle Pain and Muscle Weakness. Neurological Present- Tingling. Not Present- Decreased Memory, Fainting, Headaches, Numbness, Seizures, Tremor, Trouble walking and Weakness. Psychiatric Not Present- Anxiety, Bipolar, Change in Sleep Pattern, Depression, Fearful and Frequent crying. Endocrine Not Present- Cold Intolerance, Excessive Hunger, Hair Changes, Heat Intolerance, Hot flashes and New Diabetes.  Vitals (Henry Nichols CMA; 06/27/2017 2:55 PM) 06/27/2017 2:55 PM Weight: 135.6 lb Height: 70in Body Surface Area: 1.77 m Body Mass Index: 19.46 kg/m  Temp.: 97.18F(Oral)  BP: 110/60 (Sitting, Left Arm, Standard)       Physical Exam (Henry Nichols A. [name] MD; 06/27/2017 3:10 PM) General Mental Status-Alert. General Appearance-Consistent with stated age. Hydration-Well  hydrated. Voice-Normal.  Head and Neck Head-normocephalic, atraumatic with no lesions or palpable masses. Trachea-midline. Thyroid Gland Characteristics - normal size and consistency.  Eye Eyeball - Bilateral-Extraocular movements intact. Sclera/Conjunctiva - Bilateral-No scleral icterus.  Chest  and Lung Exam Chest and lung exam reveals -quiet, even and easy respiratory effort with no use of accessory muscles and on auscultation, normal breath sounds, no adventitious sounds and normal vocal resonance. Inspection Chest Wall - Normal. Back - normal.  Breast - Did not examine.  Cardiovascular Cardiovascular examination reveals -normal heart sounds, regular rate and rhythm with no murmurs and normal pedal pulses bilaterally.  Abdomen Inspection Inspection of the abdomen reveals - No Hernias. Skin - Scar - no surgical scars. Palpation/Percussion Palpation and Percussion of the abdomen reveal - Soft, Non Tender, No Rebound tenderness, No Rigidity (guarding) and No hepatosplenomegaly. Auscultation Auscultation of the abdomen reveals - Bowel sounds normal.  Neurologic - Did not examine.  Musculoskeletal - Did not examine.  Lymphatic Head & Neck  General Head & Neck Lymphatics: Bilateral - Description - Normal. Axillary  General Axillary Region: Bilateral - Description - Normal. Tenderness - Non Tender. Femoral & Inguinal  Generalized Femoral & Inguinal Lymphatics: Bilateral - Description - Normal. Tenderness - Non Tender.    Assessment & Plan (Casidee Jann A. [name] MD; 06/27/2017 3:11 PM) SMALL BOWEL MASS (K63.89) Impression: I reviewed the images on the CT scan regarding the small bowel mass. There is definitely circumferential thickening and the findings are worrisome for malignancy. He is also having intermittent obstructive symptoms. Surgeries urgently recommended. I am recommending a laparoscopic assisted small bowel resection. I discussed the procedure with  him in detail. I assessed the risk of the surgery which includes but is not limited to bleeding, infection, injury to surrounding structures, anastomotic leak, the need for further procedures, cardiopulmonary issues, etc. He understands and agrees to proceed with surgery which is scheduled urgently

## 2017-07-06 ENCOUNTER — Other Ambulatory Visit: Payer: Self-pay

## 2017-07-06 ENCOUNTER — Inpatient Hospital Stay (HOSPITAL_COMMUNITY)
Admission: RE | Admit: 2017-07-06 | Discharge: 2017-07-09 | DRG: 822 | Disposition: A | Discharge status: Home / Self Care | Payer: Medicare Other | Attending: General Surgery | Admitting: General Surgery

## 2017-07-06 ENCOUNTER — Inpatient Hospital Stay (HOSPITAL_COMMUNITY): Payer: Medicare Other | Admitting: Emergency Medicine

## 2017-07-06 ENCOUNTER — Inpatient Hospital Stay (HOSPITAL_COMMUNITY): Payer: Medicare Other | Admitting: Certified Registered Nurse Anesthetist

## 2017-07-06 ENCOUNTER — Encounter (HOSPITAL_COMMUNITY)
Admission: RE | Disposition: A | Discharge status: Home / Self Care | Payer: Self-pay | Source: Home / Self Care | Attending: Surgery

## 2017-07-06 ENCOUNTER — Encounter (HOSPITAL_COMMUNITY): Payer: Self-pay | Admitting: Certified Registered Nurse Anesthetist

## 2017-07-06 DIAGNOSIS — R634 Abnormal weight loss: Secondary | ICD-10-CM | POA: Diagnosis present

## 2017-07-06 DIAGNOSIS — E7800 Pure hypercholesterolemia, unspecified: Secondary | ICD-10-CM | POA: Diagnosis present

## 2017-07-06 DIAGNOSIS — K5900 Constipation, unspecified: Secondary | ICD-10-CM | POA: Diagnosis present

## 2017-07-06 DIAGNOSIS — Z79899 Other long term (current) drug therapy: Secondary | ICD-10-CM | POA: Diagnosis not present

## 2017-07-06 DIAGNOSIS — C8519 Unspecified B-cell lymphoma, extranodal and solid organ sites: Secondary | ICD-10-CM | POA: Diagnosis present

## 2017-07-06 DIAGNOSIS — Z87891 Personal history of nicotine dependence: Secondary | ICD-10-CM | POA: Diagnosis not present

## 2017-07-06 DIAGNOSIS — I10 Essential (primary) hypertension: Secondary | ICD-10-CM | POA: Diagnosis present

## 2017-07-06 DIAGNOSIS — C8233 Follicular lymphoma grade IIIa, intra-abdominal lymph nodes: Secondary | ICD-10-CM | POA: Diagnosis not present

## 2017-07-06 DIAGNOSIS — K6389 Other specified diseases of intestine: Secondary | ICD-10-CM | POA: Diagnosis not present

## 2017-07-06 DIAGNOSIS — Z7982 Long term (current) use of aspirin: Secondary | ICD-10-CM | POA: Diagnosis not present

## 2017-07-06 DIAGNOSIS — R1013 Epigastric pain: Secondary | ICD-10-CM | POA: Diagnosis not present

## 2017-07-06 DIAGNOSIS — C8239 Follicular lymphoma grade IIIa, extranodal and solid organ sites: Secondary | ICD-10-CM | POA: Diagnosis not present

## 2017-07-06 HISTORY — DX: Other specified diseases of intestine: K63.89

## 2017-07-06 HISTORY — DX: Unspecified hearing loss, unspecified ear: H91.90

## 2017-07-06 HISTORY — PX: LAPAROSCOPIC SMALL BOWEL RESECTION: SUR793

## 2017-07-06 HISTORY — PX: LAPAROSCOPIC SMALL BOWEL RESECTION: SHX5929

## 2017-07-06 SURGERY — EXCISION, SMALL INTESTINE, LAPAROSCOPIC
Anesthesia: General | Site: Abdomen

## 2017-07-06 MED ORDER — PANTOPRAZOLE SODIUM 40 MG PO TBEC
40.0000 mg | DELAYED_RELEASE_TABLET | Freq: Every day | ORAL | Status: DC
Start: 2017-07-07 — End: 2017-07-09
  Administered 2017-07-07 – 2017-07-09 (×3): 40 mg via ORAL
  Filled 2017-07-06 (×3): qty 1

## 2017-07-06 MED ORDER — GLYCOPYRROLATE 0.2 MG/ML IJ SOLN
INTRAMUSCULAR | Status: DC | PRN
  Administered 2017-07-06 (×2): 0.2 mg via INTRAVENOUS

## 2017-07-06 MED ORDER — SUGAMMADEX SODIUM 200 MG/2ML IV SOLN
INTRAVENOUS | Status: DC | PRN
  Administered 2017-07-06: 150 mg via INTRAVENOUS

## 2017-07-06 MED ORDER — ROCURONIUM BROMIDE 10 MG/ML (PF) SYRINGE
PREFILLED_SYRINGE | INTRAVENOUS | Status: AC
  Filled 2017-07-06: qty 5

## 2017-07-06 MED ORDER — POTASSIUM CHLORIDE IN NACL 20-0.9 MEQ/L-% IV SOLN
INTRAVENOUS | Status: DC
  Administered 2017-07-06 – 2017-07-07 (×3): via INTRAVENOUS
  Filled 2017-07-06 (×5): qty 1000

## 2017-07-06 MED ORDER — ENOXAPARIN SODIUM 40 MG/0.4ML ~~LOC~~ SOLN
40.0000 mg | SUBCUTANEOUS | Status: DC
  Administered 2017-07-07 – 2017-07-09 (×3): 40 mg via SUBCUTANEOUS
  Filled 2017-07-06 (×3): qty 0.4

## 2017-07-06 MED ORDER — ONDANSETRON 4 MG PO TBDP: 4.0000 mg | ORAL_TABLET | Freq: Four times a day (QID) | ORAL | Status: DC | PRN

## 2017-07-06 MED ORDER — CHLORHEXIDINE GLUCONATE CLOTH 2 % EX PADS: 6.0000 | MEDICATED_PAD | Freq: Once | CUTANEOUS | Status: DC

## 2017-07-06 MED ORDER — LACTATED RINGERS IV SOLN
INTRAVENOUS | Status: DC
  Administered 2017-07-06 (×2): via INTRAVENOUS

## 2017-07-06 MED ORDER — DEXAMETHASONE SODIUM PHOSPHATE 10 MG/ML IJ SOLN
INTRAMUSCULAR | Status: DC | PRN
Start: 2017-07-06 — End: 2017-07-06
  Administered 2017-07-06: 10 mg via INTRAVENOUS

## 2017-07-06 MED ORDER — ATENOLOL-CHLORTHALIDONE 100-25 MG PO TABS: 0.5000 | ORAL_TABLET | Freq: Every day | ORAL | Status: DC

## 2017-07-06 MED ORDER — FENTANYL CITRATE (PF) 100 MCG/2ML IJ SOLN: 25.0000 ug | INTRAMUSCULAR | Status: DC | PRN

## 2017-07-06 MED ORDER — ALVIMOPAN 12 MG PO CAPS
12.0000 mg | ORAL_CAPSULE | Freq: Two times a day (BID) | ORAL | Status: DC
  Administered 2017-07-07: 12 mg via ORAL
  Filled 2017-07-06: qty 1

## 2017-07-06 MED ORDER — PHENYLEPHRINE 40 MCG/ML (10ML) SYRINGE FOR IV PUSH (FOR BLOOD PRESSURE SUPPORT)
PREFILLED_SYRINGE | INTRAVENOUS | Status: AC
  Filled 2017-07-06: qty 10

## 2017-07-06 MED ORDER — CHLORTHALIDONE 25 MG PO TABS
12.5000 mg | ORAL_TABLET | Freq: Every day | ORAL | Status: DC
  Administered 2017-07-07 – 2017-07-09 (×3): 12.5 mg via ORAL
  Filled 2017-07-06 (×3): qty 0.5

## 2017-07-06 MED ORDER — DIPHENHYDRAMINE HCL 50 MG/ML IJ SOLN: 12.5000 mg | Freq: Four times a day (QID) | INTRAMUSCULAR | Status: DC | PRN

## 2017-07-06 MED ORDER — ONDANSETRON HCL 4 MG/2ML IJ SOLN: 4.0000 mg | Freq: Four times a day (QID) | INTRAMUSCULAR | Status: DC | PRN

## 2017-07-06 MED ORDER — ALVIMOPAN 12 MG PO CAPS
12.0000 mg | ORAL_CAPSULE | ORAL | Status: AC
  Administered 2017-07-06: 12 mg via ORAL
  Filled 2017-07-06: qty 1

## 2017-07-06 MED ORDER — PROPOFOL 10 MG/ML IV BOLUS
INTRAVENOUS | Status: DC | PRN
  Administered 2017-07-06: 130 mg via INTRAVENOUS

## 2017-07-06 MED ORDER — ROCURONIUM BROMIDE 100 MG/10ML IV SOLN
INTRAVENOUS | Status: DC | PRN
  Administered 2017-07-06: 50 mg via INTRAVENOUS

## 2017-07-06 MED ORDER — ONDANSETRON HCL 4 MG/2ML IJ SOLN
INTRAMUSCULAR | Status: DC | PRN
  Administered 2017-07-06: 4 mg via INTRAVENOUS

## 2017-07-06 MED ORDER — LIDOCAINE HCL (CARDIAC) 20 MG/ML IV SOLN
INTRAVENOUS | Status: DC | PRN
  Administered 2017-07-06: 100 mg via INTRAVENOUS

## 2017-07-06 MED ORDER — BUPIVACAINE-EPINEPHRINE (PF) 0.25% -1:200000 IJ SOLN
INTRAMUSCULAR | Status: AC
  Filled 2017-07-06: qty 30

## 2017-07-06 MED ORDER — 0.9 % SODIUM CHLORIDE (POUR BTL) OPTIME
TOPICAL | Status: DC | PRN
  Administered 2017-07-06: 1000 mL

## 2017-07-06 MED ORDER — SUGAMMADEX SODIUM 200 MG/2ML IV SOLN
INTRAVENOUS | Status: AC
  Filled 2017-07-06: qty 2

## 2017-07-06 MED ORDER — ATENOLOL 50 MG PO TABS
50.0000 mg | ORAL_TABLET | Freq: Every day | ORAL | Status: DC
  Administered 2017-07-07 – 2017-07-09 (×3): 50 mg via ORAL
  Filled 2017-07-06 (×3): qty 1

## 2017-07-06 MED ORDER — DEXAMETHASONE SODIUM PHOSPHATE 10 MG/ML IJ SOLN
INTRAMUSCULAR | Status: AC
  Filled 2017-07-06: qty 1

## 2017-07-06 MED ORDER — LIDOCAINE 2% (20 MG/ML) 5 ML SYRINGE
INTRAMUSCULAR | Status: AC
  Filled 2017-07-06: qty 5

## 2017-07-06 MED ORDER — DIPHENHYDRAMINE HCL 12.5 MG/5ML PO ELIX: 12.5000 mg | ORAL_SOLUTION | Freq: Four times a day (QID) | ORAL | Status: DC | PRN

## 2017-07-06 MED ORDER — FENTANYL CITRATE (PF) 250 MCG/5ML IJ SOLN
INTRAMUSCULAR | Status: AC
  Filled 2017-07-06: qty 5

## 2017-07-06 MED ORDER — ALBUTEROL SULFATE (2.5 MG/3ML) 0.083% IN NEBU: 2.5000 mg | INHALATION_SOLUTION | Freq: Four times a day (QID) | RESPIRATORY_TRACT | Status: DC | PRN

## 2017-07-06 MED ORDER — BUPIVACAINE-EPINEPHRINE 0.25% -1:200000 IJ SOLN
INTRAMUSCULAR | Status: DC | PRN
  Administered 2017-07-06: 20 mL

## 2017-07-06 MED ORDER — CEFOTETAN DISODIUM-DEXTROSE 2-2.08 GM-%(50ML) IV SOLR
2.0000 g | INTRAVENOUS | Status: AC
  Administered 2017-07-06: 2 g via INTRAVENOUS
  Filled 2017-07-06: qty 50

## 2017-07-06 MED ORDER — FENTANYL CITRATE (PF) 100 MCG/2ML IJ SOLN
INTRAMUSCULAR | Status: DC | PRN
Start: 2017-07-06 — End: 2017-07-06
  Administered 2017-07-06: 50 ug via INTRAVENOUS
  Administered 2017-07-06: 100 ug via INTRAVENOUS
  Administered 2017-07-06: 50 ug via INTRAVENOUS

## 2017-07-06 MED ORDER — MORPHINE SULFATE (PF) 4 MG/ML IV SOLN
1.0000 mg | INTRAVENOUS | Status: DC | PRN
  Administered 2017-07-06: 1 mg via INTRAVENOUS
  Administered 2017-07-07: 4 mg via INTRAVENOUS
  Administered 2017-07-07: 2 mg via INTRAVENOUS
  Administered 2017-07-07: 1 mg via INTRAVENOUS
  Administered 2017-07-07: 4 mg via INTRAVENOUS
  Administered 2017-07-07 – 2017-07-08 (×2): 1 mg via INTRAVENOUS
  Filled 2017-07-06 (×7): qty 1

## 2017-07-06 MED ORDER — ONDANSETRON HCL 4 MG/2ML IJ SOLN
INTRAMUSCULAR | Status: AC
  Filled 2017-07-06: qty 2

## 2017-07-06 SURGICAL SUPPLY — 80 items
ADH SKN CLS APL DERMABOND .7 (GAUZE/BANDAGES/DRESSINGS) ×2
APPLIER CLIP 5 13 M/L LIGAMAX5 (MISCELLANEOUS)
APPLIER CLIP ROT 10 11.4 M/L (STAPLE)
APR CLP MED LRG 11.4X10 (STAPLE)
APR CLP MED LRG 5 ANG JAW (MISCELLANEOUS)
BLADE CLIPPER SURG (BLADE) ×2 IMPLANT
CANISTER SUCT 3000ML PPV (MISCELLANEOUS) ×3 IMPLANT
CELLS DAT CNTRL 66122 CELL SVR (MISCELLANEOUS) ×1 IMPLANT
CHLORAPREP W/TINT 26ML (MISCELLANEOUS) ×3 IMPLANT
CLIP APPLIE 5 13 M/L LIGAMAX5 (MISCELLANEOUS) IMPLANT
CLIP APPLIE ROT 10 11.4 M/L (STAPLE) IMPLANT
COVER MAYO STAND STRL (DRAPES) ×2 IMPLANT
COVER SURGICAL LIGHT HANDLE (MISCELLANEOUS) ×4 IMPLANT
DERMABOND ADVANCED (GAUZE/BANDAGES/DRESSINGS) ×4
DERMABOND ADVANCED .7 DNX12 (GAUZE/BANDAGES/DRESSINGS) IMPLANT
DRAPE HALF SHEET 40X57 (DRAPES) ×1 IMPLANT
DRAPE UTILITY XL STRL (DRAPES) ×5 IMPLANT
DRAPE WARM FLUID 44X44 (DRAPE) ×1 IMPLANT
DRSG OPSITE POSTOP 4X10 (GAUZE/BANDAGES/DRESSINGS) IMPLANT
DRSG OPSITE POSTOP 4X8 (GAUZE/BANDAGES/DRESSINGS) IMPLANT
ELECT BLADE 6.5 EXT (BLADE) ×1 IMPLANT
ELECT CAUTERY BLADE 6.4 (BLADE) ×6 IMPLANT
ELECT REM PT RETURN 9FT ADLT (ELECTROSURGICAL) ×3
ELECTRODE REM PT RTRN 9FT ADLT (ELECTROSURGICAL) ×1 IMPLANT
GEL ULTRASOUND 20GR AQUASONIC (MISCELLANEOUS) IMPLANT
GLOVE SURG SIGNA 7.5 PF LTX (GLOVE) ×4 IMPLANT
GOWN STRL REUS W/ TWL LRG LVL3 (GOWN DISPOSABLE) ×6 IMPLANT
GOWN STRL REUS W/ TWL XL LVL3 (GOWN DISPOSABLE) ×2 IMPLANT
GOWN STRL REUS W/TWL LRG LVL3 (GOWN DISPOSABLE) ×9
GOWN STRL REUS W/TWL XL LVL3 (GOWN DISPOSABLE) ×6
KIT BASIN OR (CUSTOM PROCEDURE TRAY) ×3 IMPLANT
LEGGING LITHOTOMY PAIR STRL (DRAPES) ×1 IMPLANT
LIGASURE IMPACT 36 18CM CVD LR (INSTRUMENTS) ×2 IMPLANT
NS IRRIG 1000ML POUR BTL (IV SOLUTION) ×4 IMPLANT
PAD ARMBOARD 7.5X6 YLW CONV (MISCELLANEOUS) ×6 IMPLANT
PENCIL BUTTON HOLSTER BLD 10FT (ELECTRODE) ×4 IMPLANT
RELOAD PROXIMATE 75MM BLUE (ENDOMECHANICALS) ×6 IMPLANT
RELOAD STAPLE 75 3.8 BLU REG (ENDOMECHANICALS) IMPLANT
RETRACTOR WND ALEXIS 18 MED (MISCELLANEOUS) IMPLANT
RTRCTR WOUND ALEXIS 18CM MED (MISCELLANEOUS) ×3
RTRCTR WOUND ALEXIS 18CM SML (INSTRUMENTS) ×3
SAVER CELL AAL HAEMONETICS (INSTRUMENTS) IMPLANT
SCISSORS LAP 5X35 DISP (ENDOMECHANICALS) ×3 IMPLANT
SET IRRIG TUBING LAPAROSCOPIC (IRRIGATION / IRRIGATOR) IMPLANT
SHEARS HARMONIC ACE PLUS 36CM (ENDOMECHANICALS) ×1 IMPLANT
SLEEVE ENDOPATH XCEL 5M (ENDOMECHANICALS) ×3 IMPLANT
SPECIMEN JAR LARGE (MISCELLANEOUS) ×3 IMPLANT
SPONGE LAP 18X18 X RAY DECT (DISPOSABLE) ×2 IMPLANT
STAPLER GUN LINEAR PROX 60 (STAPLE) ×2 IMPLANT
STAPLER PROXIMATE 75MM BLUE (STAPLE) ×2 IMPLANT
STAPLER VISISTAT 35W (STAPLE) ×3 IMPLANT
SUCTION POOLE TIP (SUCTIONS) ×3 IMPLANT
SURGILUBE 2OZ TUBE FLIPTOP (MISCELLANEOUS) ×1 IMPLANT
SUT MNCRL AB 4-0 PS2 18 (SUTURE) ×2 IMPLANT
SUT PDS AB 1 CTX 36 (SUTURE) ×4 IMPLANT
SUT PDS AB 1 TP1 96 (SUTURE) ×6 IMPLANT
SUT PROLENE 2 0 CT2 30 (SUTURE) IMPLANT
SUT PROLENE 2 0 KS (SUTURE) IMPLANT
SUT SILK 2 0 SH CR/8 (SUTURE) ×3 IMPLANT
SUT SILK 2 0 TIES 10X30 (SUTURE) ×3 IMPLANT
SUT SILK 3 0 SH CR/8 (SUTURE) ×3 IMPLANT
SUT SILK 3 0 TIES 10X30 (SUTURE) ×3 IMPLANT
SUT VIC AB 3-0 SH 27 (SUTURE) ×3
SUT VIC AB 3-0 SH 27XBRD (SUTURE) IMPLANT
SYR BULB IRRIGATION 50ML (SYRINGE) ×1 IMPLANT
SYS LAPSCP GELPORT 120MM (MISCELLANEOUS)
SYSTEM LAPSCP GELPORT 120MM (MISCELLANEOUS) IMPLANT
TOWEL OR 17X26 10 PK STRL BLUE (TOWEL DISPOSABLE) ×4 IMPLANT
TRAY FOLEY W/METER SILVER 16FR (SET/KITS/TRAYS/PACK) ×3 IMPLANT
TRAY LAPAROSCOPIC MC (CUSTOM PROCEDURE TRAY) ×3 IMPLANT
TRAY PROCTOSCOPIC FIBER OPTIC (SET/KITS/TRAYS/PACK) ×1 IMPLANT
TROCAR XCEL 12X100 BLDLESS (ENDOMECHANICALS) IMPLANT
TROCAR XCEL BLUNT TIP 100MML (ENDOMECHANICALS) ×2 IMPLANT
TROCAR XCEL NON-BLD 11X100MML (ENDOMECHANICALS) IMPLANT
TROCAR XCEL NON-BLD 5MMX100MML (ENDOMECHANICALS) ×3 IMPLANT
TUBE CONNECTING 12'X1/4 (SUCTIONS) ×1
TUBE CONNECTING 12X1/4 (SUCTIONS) ×3 IMPLANT
TUBING INSUF HEATED (TUBING) ×1 IMPLANT
TUBING INSUFFLATION (TUBING) ×3 IMPLANT
YANKAUER SUCT BULB TIP NO VENT (SUCTIONS) ×6 IMPLANT

## 2017-07-06 NOTE — Interval H&P Note (Signed)
History and Physical Interval Note:no change in H and P  07/06/2017 11:08 AM  [name]  has presented today for surgery, with the diagnosis of SMALL BOWEL MASS  The various methods of treatment have been discussed with the patient and family. After consideration of risks, benefits and other options for treatment, the patient has consented to  Procedure(s): LAPAROSCOPIC ASSISTED SMALL BOWEL RESECTION (N/Nichols) as Nichols surgical intervention .  The patient's history has been reviewed, patient examined, no change in status, stable for surgery.  I have reviewed the patient's chart and labs.  Questions were answered to the patient's satisfaction.     Henry Nichols

## 2017-07-06 NOTE — Anesthesia Procedure Notes (Signed)
Procedure Name: Intubation Date/Time: 07/06/2017 12:36 PM Performed by: Candis Shine, CRNA Pre-anesthesia Checklist: Patient identified, Emergency Drugs available, Suction available and Patient being monitored Patient Re-evaluated:Patient Re-evaluated prior to induction Oxygen Delivery Method: Circle System Utilized Preoxygenation: Pre-oxygenation with 100% oxygen Induction Type: IV induction Ventilation: Mask ventilation without difficulty and Oral airway inserted - appropriate to patient size Laryngoscope Size: Mac and 3 Grade View: Grade I Tube type: Oral Tube size: 7.5 mm Number of attempts: 1 Airway Equipment and Method: Stylet and Oral airway Placement Confirmation: ETT inserted through vocal cords under direct vision,  positive ETCO2 and breath sounds checked- equal and bilateral Secured at: 22 cm Tube secured with: Tape Dental Injury: Teeth and Oropharynx as per pre-operative assessment

## 2017-07-06 NOTE — Anesthesia Preprocedure Evaluation (Addendum)
Anesthesia Evaluation  Patient identified by MRN, date of birth, ID band Patient awake  General Assessment Comment:Case discussed with Dr. Skeet Latch who stated if patient has no chest pain and no SOB ok to proceed. After reconsult patient in preop room with Dr. [name] present, patient didt confirm again  no chest pain or SOB and asymptomatic. Informed patient of above and need to follow with cardiac service as outpatient as per Dr. Oval Linsey.  Reviewed: Allergy & Precautions, NPO status , Patient's Chart, lab work & pertinent test results  History of Anesthesia Complications (+) Emergence Delirium  Airway Mallampati: II  TM Distance: >3 FB     Dental   Pulmonary pneumonia, former smoker,    breath sounds clear to auscultation       Cardiovascular hypertension,  Rhythm:Regular Rate:Bradycardia     Neuro/Psych    GI/Hepatic Neg liver ROS, GERD  ,  Endo/Other  negative endocrine ROS  Renal/GU negative Renal ROS     Musculoskeletal   Abdominal   Peds  Hematology   Anesthesia Other Findings   Reproductive/Obstetrics                           Anesthesia Physical Anesthesia Plan  ASA: III  Anesthesia Plan: General   Post-op Pain Management:    Induction: Intravenous  PONV Risk Score and Plan: 2 and Treatment may vary due to age or medical condition  Airway Management Planned: Oral ETT  Additional Equipment:   Intra-op Plan:   Post-operative Plan: Possible Post-op intubation/ventilation  Informed Consent: I have reviewed the patients History and Physical, chart, labs and discussed the procedure including the risks, benefits and alternatives for the proposed anesthesia with the patient or authorized representative who has indicated his/her understanding and acceptance.   Dental advisory given  Plan Discussed with: CRNA, Anesthesiologist and Surgeon  Anesthesia Plan Comments:         Anesthesia Quick Evaluation

## 2017-07-06 NOTE — Anesthesia Postprocedure Evaluation (Signed)
Anesthesia Post Note  Patient: Henry Nichols  Procedure(s) Performed: LAPAROSCOPIC ASSISTED SMALL BOWEL RESECTION (N/A Abdomen)     Patient location during evaluation: PACU Anesthesia Type: General Level of consciousness: awake Pain management: pain level controlled Vital Signs Assessment: post-procedure vital signs reviewed and stable Respiratory status: spontaneous breathing Cardiovascular status: stable Anesthetic complications: no    Last Vitals:  Vitals:   07/06/17 1054 07/06/17 1409  BP: (!) 146/76 120/67  Pulse: (!) 55   Resp: 18 15  Temp: 36.5 C (!) 36.3 C  SpO2: 100% 100%    Last Pain:  Vitals:   07/06/17 1409  TempSrc:   PainSc: Asleep                 Leannah Guse

## 2017-07-06 NOTE — Op Note (Signed)
LAPAROSCOPIC ASSISTED SMALL BOWEL RESECTION  Procedure Note  Henry Nichols 07/06/2017   Pre-op Diagnosis: SMALL BOWEL MASS     Post-op Diagnosis: same  Procedure(s): LAPAROSCOPIC ASSISTED SMALL BOWEL RESECTION  Surgeon(s): Coralie Keens, MD  Anesthesia: General  Staff:  Circulator: Paulette Blanch, RN Scrub Person: Courtney Paris Circulator Assistant: Prewett, Otila Back, RN RN First Assistant: Quincy Carnes, RN  Estimated Blood Loss: Minimal               Specimens: sent to path  Indications: This is an 83 year old gentleman who presented with weight loss and intermittent nausea and vomiting.  He underwent a CT scan showing him to have circumferential small bowel thickening in the distal small bowel with an adjacent 2 cm mass.  This was worrisome for a potential malignancy with a partial small bowel obstruction.  The decision was made to proceed to the operating room  Findings: The patient was found to have what appears to be an intraluminal mass in the distal small bowel which was approximately 4-5 cm in length.  There was adjacent adenopathy.  No other intra-abdominal pathology was identified.  Procedure: The patient was brought to the operating room and identified as the correct patient.  He was placed supine on the operating table and general anesthesia was induced.  His abdomen was then prepped and draped in the usual sterile fashion.  I made a small vertical incision just below the umbilicus with a scalpel.  I carried this down to the fascia which was then opened with a scalpel as well.  A hemostat was then used to pass into the peritoneal cavity under direct vision.  A 0 Vicryl pursestring suture was then placed around the fascial opening.  The [facility] port was placed in the opening and insufflation of the abdomen was begun.  I placed a 5 mm trocar in the patient's left lower quadrant under direct vision.  I then placed the patient in the Trendelenburg  position and evaluate the small bowel.  The small bowel mass was readily identified in the distal small bowel.  There was a large lymph node in the mesentery visible adjacent to the mass.  The mass was very mobile.  The patient's pelvis, diaphragm, and liver as well as omentum appeared free of any disease.  At this point, the decision was made to proceed with a small bowel resection.  I removed the Hassan trocar at the umbilicus and made the incision larger.  The abdomen was then deflated.  I then placed a wound protector into the incision and was able to eviscerate the small bowel that contain the mass.  There was thickening in the mesentery as well as several enlarged lymph nodes.  I transected the small bowel proximal and distal to the mass with a GIA-75 stapler.  I then took down the mesentery with the LigaSure cautery device.  The mass and small bowel were then sent to pathology for evaluation.  I had to control several areas in the mesentery that were oozing with 3-0 silk sutures.  I then reapproximated the small bowel in a side-to-side fashion with interrupted silk sutures.  I created 2 enterotomies with the cautery and the performed a side-to-side small bowel anastomosis with the GIA-75 stapler.  The opening was then closed with a TA 60 stapler.  I then reinforced the staple line with interrupted 3-0 silk sutures.  I closed the mesenteric defect with silk sutures as well.  A  pink, well-perfused, and widely patent anastomosis appeared to be achieved.  I then placed this back into the abdominal cavity.  We irrigated the abdomen with saline.  Hemostasis appeared to be achieved.  I then closed the patient's midline fascia with a #1 PDS suture.  The fascia was then irrigated and anesthetized with Marcaine.  The subcutaneous tissue at the midline incision a trocar site was then closed with 3-0 Vicryl sutures and running 4-0 Monocryl sutures.  Dermabond was then applied.  The patient tolerated procedure well.  All  the counts were correct at the end of the procedure.  The patient was then extubated in the operating room and taken in a stable condition to the recovery room.          [name] A   Date: 07/06/2017  Time: 1:39 PM

## 2017-07-06 NOTE — Transfer of Care (Signed)
Immediate Anesthesia Transfer of Care Note  Patient: Henry Nichols  Procedure(s) Performed: LAPAROSCOPIC ASSISTED SMALL BOWEL RESECTION (N/A Abdomen)  Patient Location: PACU  Anesthesia Type:General  Level of Consciousness: awake, alert  and oriented  Airway & Oxygen Therapy: Patient Spontanous Breathing and Patient connected to nasal cannula oxygen  Post-op Assessment: Report given to RN and Post -op Vital signs reviewed and stable  Post vital signs: Reviewed and stable  Last Vitals:  Vitals:   07/06/17 1054 07/06/17 1409  BP: (!) 146/76 120/67  Pulse: (!) 55   Resp: 18 15  Temp: 36.5 C (!) 36.3 C  SpO2: 100% 100%    Last Pain:  Vitals:   07/06/17 1409  TempSrc:   PainSc: Asleep         Complications: No apparent anesthesia complications

## 2017-07-07 ENCOUNTER — Encounter (HOSPITAL_COMMUNITY): Payer: Self-pay | Admitting: Surgery

## 2017-07-07 LAB — BASIC METABOLIC PANEL
Anion gap: 7 (ref 5–15)
BUN: 9 mg/dL (ref 6–20)
CHLORIDE: 104 mmol/L (ref 101–111)
CO2: 27 mmol/L (ref 22–32)
CREATININE: 1.04 mg/dL (ref 0.61–1.24)
Calcium: 8.5 mg/dL — ABNORMAL LOW (ref 8.9–10.3)
GFR calc non Af Amer: >60 mL/min (ref >60)
Glucose, Bld: 126 mg/dL — ABNORMAL HIGH (ref 65–99)
POTASSIUM: 3.8 mmol/L (ref 3.5–5.1)
SODIUM: 138 mmol/L (ref 135–145)

## 2017-07-07 LAB — CBC
HEMATOCRIT: 32.3 % — AB (ref 39.0–52.0)
Hemoglobin: 10.7 g/dL — ABNORMAL LOW (ref 13.0–17.0)
MCH: 30.7 pg (ref 26.0–34.0)
MCHC: 33.1 g/dL (ref 30.0–36.0)
MCV: 92.8 fL (ref 78.0–100.0)
Platelets: 212 10*3/uL (ref 150–400)
RBC: 3.48 MIL/uL — AB (ref 4.22–5.81)
RDW: 13.9 % (ref 11.5–15.5)
WBC: 8.3 10*3/uL (ref 4.0–10.5)

## 2017-07-07 NOTE — Progress Notes (Signed)
Foley catheter removed.  10 cc of normal saline removed from foley balloon. Pt. Tolerated well.  300 cc of tea colored empty from foley bag.  Urinal left at patient's bedside.  I have informed patient to make staff aware when he voids.  I have also instructed patient on how to use IS.  He verbalizes understanding.  He was able to reach 1500 on IS.  Pain medication also given to patient for surgical pain discomfort.

## 2017-07-07 NOTE — Progress Notes (Signed)
Patient notified me he had a bowel movement and was wondering if he could eat. I called on call MD Donne Hazel he said we could put him on clear liquids. Will continue to monitor.

## 2017-07-07 NOTE — Progress Notes (Signed)
1 Day Post-Op    CC: Small bowel mass  Subjective: Patient is up walking some, no flatus.  Abdominal wound looks good.  He is getting some ice chips clears.  No real complaints.  Objective: Vital signs in last 24 hours: Temp:  [97.4 F (36.3 C)-98.7 F (37.1 C)] 98.7 F (37.1 C) (11/22 0358) Pulse Rate:  [55-68] 60 (11/22 0822) Resp:  [13-19] 19 (11/22 0358) BP: (95-146)/(53-85) 104/56 (11/22 0822) SpO2:  [95 %-100 %] 99 % (11/22 0358) Weight:  [62.7 kg (138 lb 4.8 oz)-64.4 kg (141 lb 15.6 oz)] 64.4 kg (141 lb 15.6 oz) (11/21 1633)    N.p.o. IV 2185 Urine 1750 No stool. Afebrile BP was down some in the evening. Labs this a.m. are all essentially normal.  Hemoglobin and hematocrit are stable. K+ 3.8. Intake/Output from previous day: 11/21 0701 - 11/22 0700 In: 2185 [I.V.:2185] Out: [place] [Urine:1750; Blood:20] Intake/Output this shift: No intake/output data recorded.  General appearance: alert, cooperative and no distress  Abdomen: Soft, few bowel sounds, no distention incision site looks good.  Lab Results:  Recent Labs    07/07/17 0254  WBC 8.3  HGB 10.7*  HCT 32.3*  PLT 212    BMET Recent Labs    07/07/17 0254  NA 138  K 3.8  CL 104  CO2 27  GLUCOSE 126*  BUN 9  CREATININE 1.04  CALCIUM 8.5*   PT/INR No results for input(s): LABPROT, INR in the last 72 hours.  No results for input(s): AST, ALT, ALKPHOS, BILITOT, PROT, ALBUMIN in the last 168 hours.   Lipase  No results found for: LIPASE   Medications: . alvimopan  12 mg Oral BID  . atenolol  50 mg Oral Daily   And  . chlorthalidone  12.5 mg Oral Daily  . enoxaparin (LOVENOX) injection  40 mg Subcutaneous Q24H  . pantoprazole  40 mg Oral Daily   . 0.9 % NaCl with KCl 20 mEq / L 75 mL/hr at 07/07/17 4782   Anti-infectives (From admission, onward)   Start     Dose/Rate Route Frequency Ordered Stop   07/06/17 1042  cefoTEtan in Dextrose 5% (CEFOTAN) IVPB 2 g     2 g Intravenous On call to  O.R. 07/06/17 1042 07/06/17 1240      Assessment/Plan Small bowel mass. S/p laparoscopic assisted small bowel resection, 07/06/17, DR. Coralie Keens  POD 1  High blood pressure  Hypercholesterolemia   FEN: IV fluids/n.p.o. ID: Cefotetan preop DVT: Lovenox Foley: removed Follow-up: Dr. [name]  Plan: Henry Nichols of clears continue to mobilize.  Advance diet as bowel function returned.  LOS: 1 day    Henry Nichols 07/07/2017 [phone]

## 2017-07-07 NOTE — Plan of Care (Signed)
  Clinical Measurements: Ability to maintain clinical measurements within normal limits will improve 07/07/2017 0239 - Progressing by Irish Lack, RN   Pain Managment: General experience of comfort will improve 07/07/2017 0239 - Progressing by Irish Lack, RN   Safety: Ability to remain free from injury will improve 07/07/2017 0239 - Progressing by Irish Lack, RN   Skin Integrity: Risk for impaired skin integrity will decrease 07/07/2017 0239 - Progressing by Irish Lack, RN

## 2017-07-08 NOTE — Progress Notes (Signed)
2 Days Post-Op    CC: Small bowel mass.  Subjective: He looks good this a.m. and is tolerating a clear liquid diet.  Port site looks fine.  Had 2 bowel movements.  Objective: Vital signs in last 24 hours: Temp:  [97.6 F (36.4 C)-97.8 F (36.6 C)] 97.7 F (36.5 C) (11/23 0454) Pulse Rate:  [47-63] 47 (11/23 0454) Resp:  [17-18] 17 (11/23 0454) BP: (101-139)/(54-71) 124/60 (11/23 1001) SpO2:  [96 %-100 %] 98 % (11/23 0454)  NPO 1725 IV 500 urine BM x 2 Afebrile, VSS  Intake/Output from previous day: 11/22 0701 - 11/23 0700 In: 1725 [I.V.:1725] Out: 500 [Urine:500] Intake/Output this shift: No intake/output data recorded.  General appearance: alert, cooperative and no distress Resp: clear to auscultation bilaterally GI: Soft, sore, is not taking much for pain.  Incision looks good few bowel sounds positive.  Tolerating clear liquids  Lab Results:  Recent Labs    07/07/17 0254  WBC 8.3  HGB 10.7*  HCT 32.3*  PLT 212    BMET Recent Labs    07/07/17 0254  NA 138  K 3.8  CL 104  CO2 27  GLUCOSE 126*  BUN 9  CREATININE 1.04  CALCIUM 8.5*   PT/INR No results for input(s): LABPROT, INR in the last 72 hours.  No results for input(s): AST, ALT, ALKPHOS, BILITOT, PROT, ALBUMIN in the last 168 hours.   Lipase  No results found for: LIPASE   Medications: . atenolol  50 mg Oral Daily   And  . chlorthalidone  12.5 mg Oral Daily  . enoxaparin (LOVENOX) injection  40 mg Subcutaneous Q24H  . pantoprazole  40 mg Oral Daily    Assessment/Plan Small bowel mass. S/p laparoscopic assisted small bowel resection, 07/06/17, DR. Coralie Keens  POD 2  High blood pressure  Hypercholesterolemia   FEN: IV fluids/clears ID: Cefotetan preop DVT: Lovenox Foley: removed Follow-up: Dr. [name]   Plan: Advance his diet.  At this rate he may be ready for discharge in the a.m.     LOS: 2 days    Domini Vandehei 07/08/2017 [phone]

## 2017-07-09 MED ORDER — ACETAMINOPHEN 325 MG PO TABS: 650.0000 mg | ORAL_TABLET | Freq: Four times a day (QID) | ORAL | Status: DC | PRN

## 2017-07-09 NOTE — Discharge Summary (Signed)
[facility] Discharge Summary   Patient ID: Henry Nichols MRN: [id number] DOB/AGE: 81/18/1933 81 y.o.  Admit date: 07/06/2017 Discharge date: 07/09/2017  Discharge Diagnosis Patient Active Problem List   Diagnosis Date Noted  . Small bowel mass 07/06/2017   Imaging: No results found.  Procedures Dr. Coralie Keens 06/27/17 - LAPAROSCOPIC ASSISTED SMALL BOWEL RESECTION  Hospital Course:  The patient is a 83 year old male who presented with abdominal pain. He was referred to Dr. [name] by Dr. Arta Silence and Dr. [name] for evaluation of a small bowel mass. The patient has been having intermittent epigastric abdominal pain with vomiting and constipation as well as weight loss over the past several months. He has undergone a CT scan showing a short segment of circumferential wall thickening which is concerning for a potential malignancy or lymphoma. There is an adjacent soft tissue mass concerning for metastatic adenopathy on the films. He was admitted to the hospital for the above procedure, which he tolerated well. Pathology is pending. On POD#3 the patients vitals were stable, mobilizing, having bowel function, urinating without hesitancy, pain controlled, and stable for discharge. He will follow up with Dr. [name].   Physical Exam: General:  Alert, NAD, pleasant, comfortable Abd:  Soft, ND, Non-tender, midline incision C/D/I, +BS  Allergies as of 07/09/2017   No Known Allergies     Medication List    TAKE these medications   acetaminophen 325 MG tablet Commonly known as:  TYLENOL Take 2 tablets (650 mg total) by mouth every 6 (six) hours as needed for mild pain or moderate pain.   albuterol 108 (90 Base) MCG/ACT inhaler Commonly known as:  PROVENTIL HFA;VENTOLIN HFA Inhale 1 puff every 6 (six) hours as needed into the lungs for wheezing or shortness of breath.   aspirin EC 81 MG tablet Take 81 mg daily by mouth.    atenolol-chlorthalidone 100-25 MG tablet Commonly known as:  TENORETIC Take 0.5 tablets daily by mouth.   ibuprofen 200 MG tablet Commonly known as:  ADVIL,MOTRIN Take 200 mg every 6 (six) hours as needed by mouth for headache or moderate pain.   pantoprazole 40 MG tablet Commonly known as:  PROTONIX Take 40 mg daily by mouth.   potassium chloride SA 20 MEQ tablet Commonly known as:  K-DUR,KLOR-CON Take 40 mEq daily by mouth.   PRESERVISION AREDS 2 PO Take 1 capsule 2 (two) times daily by mouth.   simvastatin 20 MG tablet Commonly known as:  ZOCOR Take 20 mg every evening by mouth.   triamcinolone cream 0.1 % Commonly known as:  KENALOG Apply 1 application daily as needed topically (for dry skin).        Follow-up Information    Skeet Latch, MD Follow up on 08/03/2017.   Specialty:  Cardiology Why:  9:40 am arrival for a 10 am appt. LBBB and bradycardia Contact information: [address] [phone]        Coralie Keens, MD. Schedule an appointment as soon as possible for a visit in 3 week(s).   Specialty:  General Surgery Why:  for post-operative follow up.  Contact information: [place] [address] [phone]           Signed: Obie Dredge, [facility] 07/09/2017, 10:12 AM Pager: [phone] Consults: [phone] Mon-Fri 7:00 am-4:30 pm Sat-Sun 7:00 am-11:30 am

## 2017-07-09 NOTE — Discharge Instructions (Signed)
[facility]      [facility] [phone]  OPEN ABDOMINAL SURGERY: POST OP INSTRUCTIONS  Always review your discharge instruction sheet given to you by the facility where your surgery was performed.  IF YOU HAVE DISABILITY OR FAMILY LEAVE FORMS, YOU MUST BRING THEM TO THE OFFICE FOR PROCESSING.  PLEASE DO NOT GIVE THEM TO YOUR DOCTOR.  1. A prescription for pain medication may be given to you upon discharge.  Take your pain medication as prescribed, if needed.  If narcotic pain medicine is not needed, then you may take acetaminophen (Tylenol) or ibuprofen (Advil) as needed. 2. Take your usually prescribed medications unless otherwise directed. 3. If you need a refill on your pain medication, please contact your pharmacy. They will contact our office to request authorization.  Prescriptions will not be filled after 5pm or on week-ends. 4. You should follow a light diet the first few days after arrival home, such as soup and crackers, pudding, etc.unless your doctor has advised otherwise. A high-fiber, low fat diet can be resumed as tolerated.   Be sure to include lots of fluids daily. Most patients will experience some swelling and bruising on the chest and neck area.  Ice packs will help.  Swelling and bruising can take several days to resolve 5. Most patients will experience some swelling and bruising in the area of the incision. Ice pack will help. Swelling and bruising can take several days to resolve..  6. It is common to experience some constipation if taking pain medication after surgery.  Increasing fluid intake and taking a stool softener will usually help or prevent this problem from occurring.  A mild laxative (Milk of Magnesia or Miralax) should be taken according to package directions if there are no bowel movements after 48 hours. 7.  You may have steri-strips (small skin tapes) in place directly over the incision.  These strips should be left on the skin for 7-10 days.  If your  surgeon used skin glue on the incision, you may shower in 24 hours.  The glue will flake off over the next 2-3 weeks.  Any sutures or staples will be removed at the office during your follow-up visit. You may find that a light gauze bandage over your incision may keep your staples from being rubbed or pulled. You may shower and replace the bandage daily. 8. ACTIVITIES:  You may resume regular (light) daily activities beginning the next day--such as daily self-care, walking, climbing stairs--gradually increasing activities as tolerated.  You may have sexual intercourse when it is comfortable.  Refrain from any heavy lifting or straining until approved by your doctor. a. You may drive when you no longer are taking prescription pain medication, you can comfortably wear a seatbelt, and you can safely maneuver your car and apply brakes b. Return to Work: ___________________________________ 9. You should see your doctor in the office for a follow-up appointment approximately two weeks after your surgery.  Make sure that you call for this appointment within a day or two after you arrive home to insure a convenient appointment time. OTHER INSTRUCTIONS:  _____________________________________________________________ _____________________________________________________________  WHEN TO CALL YOUR DOCTOR: 1. Fever over 101.0 2. Inability to urinate 3. Nausea and/or vomiting 4. Extreme swelling or bruising 5. Continued bleeding from incision. 6. Increased pain, redness, or drainage from the incision. 7. Difficulty swallowing or breathing 8. Muscle cramping or spasms. 9. Numbness or tingling in hands or feet or around lips.  The clinic staff is available to   answer your questions during regular business hours.  Please don't hesitate to call and ask to speak to one of the nurses if you have concerns.  For further questions, please visit [email or web address]   

## 2017-07-09 NOTE — Progress Notes (Signed)
Discharge instructions gone over with patient. Home medications reviewed. No prescription needed. Follow up appointments are made. Diet, activity, reasons to call the doctor, and incisional care discussed. Signs and symptoms of infection gone over. Patient verbalized understanding of instructions.

## 2017-07-13 ENCOUNTER — Encounter: Payer: Self-pay | Admitting: Hematology

## 2017-07-13 ENCOUNTER — Telehealth: Payer: Self-pay | Admitting: Hematology

## 2017-07-13 NOTE — Telephone Encounter (Signed)
Appt has been scheduled for the pt to see Dr. Irene Limbo on 12/6 at 1pm. Pt aware to arrive 30 minutes early. Letter mailed to the pt.

## 2017-07-21 ENCOUNTER — Encounter: Payer: Self-pay | Admitting: Hematology

## 2017-07-21 ENCOUNTER — Telehealth: Payer: Self-pay | Admitting: Hematology

## 2017-07-21 ENCOUNTER — Ambulatory Visit (HOSPITAL_BASED_OUTPATIENT_CLINIC_OR_DEPARTMENT_OTHER): Payer: Medicare Other | Admitting: Hematology

## 2017-07-21 ENCOUNTER — Ambulatory Visit (HOSPITAL_BASED_OUTPATIENT_CLINIC_OR_DEPARTMENT_OTHER): Payer: Medicare Other

## 2017-07-21 VITALS — BP 126/58 | HR 52 | Temp 98.1°F | Resp 18 | Ht 70.0 in | Wt 140.7 lb

## 2017-07-21 DIAGNOSIS — C8599 Non-Hodgkin lymphoma, unspecified, extranodal and solid organ sites: Secondary | ICD-10-CM | POA: Diagnosis not present

## 2017-07-21 DIAGNOSIS — R2241 Localized swelling, mass and lump, right lower limb: Secondary | ICD-10-CM

## 2017-07-21 DIAGNOSIS — C8293 Follicular lymphoma, unspecified, intra-abdominal lymph nodes: Secondary | ICD-10-CM | POA: Diagnosis not present

## 2017-07-21 DIAGNOSIS — M7989 Other specified soft tissue disorders: Secondary | ICD-10-CM

## 2017-07-21 LAB — CBC & DIFF AND RETIC
BASO%: 1.4 % (ref 0.0–2.0)
BASOS ABS: 0.1 10*3/uL (ref 0.0–0.1)
EOS ABS: 0.5 10*3/uL (ref 0.0–0.5)
EOS%: 9.7 % — ABNORMAL HIGH (ref 0.0–7.0)
HEMATOCRIT: 34.1 % — AB (ref 38.4–49.9)
HEMOGLOBIN: 10.8 g/dL — AB (ref 13.0–17.1)
Immature Retic Fract: 5.9 % (ref 3.00–10.60)
LYMPH%: 23.7 % (ref 14.0–49.0)
MCH: 31.1 pg (ref 27.2–33.4)
MCHC: 31.7 g/dL — ABNORMAL LOW (ref 32.0–36.0)
MCV: 98.3 fL — AB (ref 79.3–98.0)
MONO#: 0.5 10*3/uL (ref 0.1–0.9)
MONO%: 10.1 % (ref 0.0–14.0)
NEUT#: 2.8 10*3/uL (ref 1.5–6.5)
NEUT%: 55.1 % (ref 39.0–75.0)
Platelets: 277 10*3/uL (ref 140–400)
RBC: 3.47 10*6/uL — ABNORMAL LOW (ref 4.20–5.82)
RDW: 15 % — ABNORMAL HIGH (ref 11.0–14.6)
Retic %: 1.91 % — ABNORMAL HIGH (ref 0.80–1.80)
Retic Ct Abs: 66.28 10*3/uL (ref 34.80–93.90)
WBC: 5.1 10*3/uL (ref 4.0–10.3)
lymph#: 1.2 10*3/uL (ref 0.9–3.3)

## 2017-07-21 LAB — COMPREHENSIVE METABOLIC PANEL
ALBUMIN: 3.3 g/dL — AB (ref 3.5–5.0)
ALT: 9 U/L (ref 0–55)
AST: 15 U/L (ref 5–34)
Alkaline Phosphatase: 65 U/L (ref 40–150)
Anion Gap: 9 mEq/L (ref 3–11)
BUN: 14.8 mg/dL (ref 7.0–26.0)
CO2: 31 meq/L — AB (ref 22–29)
Calcium: 9.1 mg/dL (ref 8.4–10.4)
Chloride: 103 mEq/L (ref 98–109)
Creatinine: 1.4 mg/dL — ABNORMAL HIGH (ref 0.7–1.3)
EGFR: 47 mL/min/{1.73_m2} — AB (ref >60)
GLUCOSE: 85 mg/dL (ref 70–140)
POTASSIUM: 4.5 meq/L (ref 3.5–5.1)
Sodium: 142 mEq/L (ref 136–145)
TOTAL PROTEIN: 6.8 g/dL (ref 6.4–8.3)
Total Bilirubin: 1.09 mg/dL (ref 0.20–1.20)

## 2017-07-21 LAB — LACTATE DEHYDROGENASE: LDH: 146 U/L (ref 125–245)

## 2017-07-21 NOTE — Progress Notes (Signed)
HEMATOLOGY/ONCOLOGY CONSULTATION NOTE  Date of Service: 07/21/2017  Patient Care Team: [name], MD as PCP - General (Family Medicine)  CHIEF COMPLAINTS/PURPOSE OF CONSULTATION:  Newly diagnosed small bowel follicular lymphoma  HISTORY OF PRESENTING ILLNESS:   Henry Nichols is a wonderful 83 y.o. male who has been referred to [place] by Dr [name], MD for evaluation and management of a small bowel follicular lypmhoma.   He presented to ED for abdominal pain and had a CT chest/abd/pelvis on 06/09/2017 with results showing: IMPRESSION: 1. Bilateral pulmonary parenchymal opacities, peribronchovascular in distribution. Favor infection or (especially given the clinical history of vomiting) aspiration. Neoplastic processes are felt unlikely but cannot be entirely excluded. Especially if the patient is diagnosed with lymphoma, consider CT followup after therapy to confirm resolution. 2. No thoracic adenopathy. 3. Mild cardiomegaly. Coronary artery atherosclerosis. Aortic Atherosclerosis (ICD10-I70.0). 4. Development of small bowel dilatation, suspicious for obstruction at the site of distal small bowel thickening on prior exam. 5. Borderline ascending aortic aneurysm. Recommend annual imaging followup by CTA or MRA. This recommendation follows 2010 ACCF/AHA/AATS/ACR/ASA/SCA/SCAI/SIR/STS/SVM Guidelines for the Diagnosis and Management of Patients with Thoracic Aortic Disease. Circulation. 2010; 121: Y195-K932. 6. Nonspecific left upper lobe pulmonary nodule.   He is accompanied by his son today. He states he is doing well overall. He reports noticing a difficulty swallowing and nausea/vomiting after eating. He saw his PCP for this and had a CT scan. He was then referred to Dr. [name] and underwent a laparoscopic small bowel resection with resoltion of symptoms.  He states he wasn't losing much weight before these symptoms began and lost 15 pounds after.   Pathology showed follicular  lymphoma   On review of systems, pt reports vomiting, constipation, weight loss, swelling and skin rash to the right ankle, and denies SOB, cough, bloody stools, diarrhea, fever, chills, night sweats and any other accompanying symptoms.  MEDICAL HISTORY:  Past Medical History:  Diagnosis Date  . GERD (gastroesophageal reflux disease)   . HOH (hard of hearing)   . Hypertension   . Pneumonia    hx  . Small bowel mass     SURGICAL HISTORY: Past Surgical History:  Procedure Laterality Date  . EYE SURGERY Bilateral 2016   cataracts  . HERNIA REPAIR Bilateral 2008   inguinal  . LAPAROSCOPIC SMALL BOWEL RESECTION  07/06/2017  . LAPAROSCOPIC SMALL BOWEL RESECTION N/A 07/06/2017   Procedure: LAPAROSCOPIC ASSISTED SMALL BOWEL RESECTION;  Surgeon: Coralie Keens, MD;  Location: [facility];  Service: General;  Laterality: N/A;    SOCIAL HISTORY: Social History   Socioeconomic History  . Marital status: Widowed    Spouse name: Not on file  . Number of children: Not on file  . Years of education: Not on file  . Highest education level: Not on file  Social Needs  . Financial resource strain: Not on file  . Food insecurity - worry: Not on file  . Food insecurity - inability: Not on file  . Transportation needs - medical: Not on file  . Transportation needs - non-medical: Not on file  Occupational History  . Not on file  Tobacco Use  . Smoking status: Former Smoker    Types: Cigars    Last attempt to quit: 06/29/1984    Years since quitting: 33.0  . Smokeless tobacco: Never Used  Substance and Sexual Activity  . Alcohol use: No    Frequency: Never  . Drug use: No  . Sexual activity: Not on  file  Other Topics Concern  . Not on file  Social History Narrative  . Not on file    FAMILY HISTORY: History reviewed. No pertinent family history.  ALLERGIES:  has No Known Allergies.  MEDICATIONS:  Current Outpatient Medications  Medication Sig Dispense Refill  . acetaminophen  (TYLENOL) 325 MG tablet Take 2 tablets (650 mg total) by mouth every 6 (six) hours as needed for mild pain or moderate pain.    Marland Kitchen albuterol (PROVENTIL HFA;VENTOLIN HFA) 108 (90 Base) MCG/ACT inhaler Inhale 1 puff every 6 (six) hours as needed into the lungs for wheezing or shortness of breath.    Marland Kitchen aspirin EC 81 MG tablet Take 81 mg daily by mouth.    Marland Kitchen atenolol-chlorthalidone (TENORETIC) 100-25 MG tablet Take 0.5 tablets daily by mouth.  1  . Multiple Vitamins-Minerals (PRESERVISION AREDS 2 PO) Take 1 capsule 2 (two) times daily by mouth.    . pantoprazole (PROTONIX) 40 MG tablet Take 40 mg daily by mouth.  3  . potassium chloride SA (K-DUR,KLOR-CON) 20 MEQ tablet Take 40 mEq daily by mouth.    . simvastatin (ZOCOR) 20 MG tablet Take 20 mg every evening by mouth.  1  . triamcinolone cream (KENALOG) 0.1 % Apply 1 application daily as needed topically (for dry skin).     No current facility-administered medications for this visit.     REVIEW OF SYSTEMS:    10 Point review of Systems was done is negative except as noted above.  PHYSICAL EXAMINATION: ECOG PERFORMANCE STATUS: 1 - Symptomatic but completely ambulatory  . Vitals:   07/21/17 1324  BP: (!) 126/58  Pulse: (!) 52  Resp: 18  Temp: 98.1 F (36.7 C)  SpO2: 100%   Filed Weights   07/21/17 1324  Weight: 140 lb 11.2 oz (63.8 kg)   .Body mass index is 20.19 kg/m.  GENERAL:alert, in no acute distress and comfortable SKIN: no acute rashes, no significant lesions EYES: conjunctiva are pink and non-injected, sclera anicteric OROPHARYNX: MMM, no exudates, no oropharyngeal erythema or ulceration NECK: supple, no JVD LYMPH:  no palpable lymphadenopathy in the cervical, axillary or inguinal regions LUNGS: clear to auscultation b/l with normal respiratory effort HEART: regular rate & rhythm ABDOMEN:  normoactive bowel sounds , non tender, not distended. Extremity: no pedal edema PSYCH: alert & oriented x 3 with fluent  speech NEURO: no focal motor/sensory deficits  LABORATORY DATA:  I have reviewed the data as listed  . CBC Latest Ref Rng & Units 07/21/2017 07/07/2017 06/29/2017  WBC 4.0 - 10.3 10e3/uL 5.1 8.3 4.3  Hemoglobin 13.0 - 17.1 g/dL 10.8(L) 10.7(L) 11.4(L)  Hematocrit 38.4 - 49.9 % 34.1(L) 32.3(L) 34.7(L)  Platelets 140 - 400 10e3/uL 277 212 207    . CMP Latest Ref Rng & Units 07/21/2017 07/07/2017 06/29/2017  Glucose 70 - 140 mg/dl 85 126(H) 96  BUN 7.0 - 26.0 mg/dL 14.8 9 12   Creatinine 0.7 - 1.3 mg/dL 1.4(H) 1.04 1.14  Sodium 136 - 145 mEq/L 142 138 138  Potassium 3.5 - 5.1 mEq/L 4.5 3.8 3.5  Chloride 101 - 111 mmol/L - 104 103  CO2 22 - 29 mEq/L 31(H) 27 28  Calcium 8.4 - 10.4 mg/dL 9.1 8.5(L) 8.9  Total Protein 6.4 - 8.3 g/dL 6.8 - -  Total Bilirubin 0.20 - 1.20 mg/dL 1.09 - -  Alkaline Phos 40 - 150 U/L 65 - -  AST 5 - 34 U/L 15 - -  ALT 0 - 55 U/L 9 - -  CT CHEST ABD 06/14/2017 IMPRESSION: 1. Bilateral pulmonary parenchymal opacities, peribronchovascular in distribution. Favor infection or (especially given the clinical history of vomiting) aspiration. Neoplastic processes are felt unlikely but cannot be entirely excluded. Especially if the patient is diagnosed with lymphoma, consider CT followup after therapy to confirm resolution. 2. No thoracic adenopathy. 3. Mild cardiomegaly. Coronary artery atherosclerosis. Aortic Atherosclerosis (ICD10-I70.0). 4. Development of small bowel dilatation, suspicious for obstruction at the site of distal small bowel thickening on prior exam. 5. Borderline ascending aortic aneurysm. Recommend annual imaging followup by CTA or MRA. This recommendation follows 2010 ACCF/AHA/AATS/ACR/ASA/SCA/SCAI/SIR/STS/SVM Guidelines for the Diagnosis and Management of Patients with Thoracic Aortic Disease. Circulation. 2010; 121: F292-K462. 6. Nonspecific left upper lobe pulmonary nodule.  RADIOGRAPHIC STUDIES: I have personally reviewed the  radiological images as listed and agreed with the findings in the report. No results found.   ASSESSMENT & PLAN:  Henry Nichols is a wonderful 82 y.o. male with   #1 Small-bowel follicular Lymphoma Unknown staging at this time. No type B symptoms Plan  -Discussed labs results, pathology results, nature hoistory of the disease and testing for CT results with pt and his son. -Time was spent explaining diagnosis of Grade 3A Follicular Non-hodgkin's Lymphoma and answering all questions -Explained the different criteria for each treatment and all treatment options -Will obtain PET scan to check metastasis, stage and determine treatment in 4-6 weeks for inflammation to resolve -Decision to treat is also based on condition of pt overall health and ability to tolerate treatment.  -Baseline labs today  -Explained if this was extensive and causing symptoms, plan will be to treat through IV chemo-immunotherapy -If plan is not to treat and pt has persisting symptoms, would recommend having GI work-up with EGD +/- Coloscopy+/_ Capsule endoscopy to evaluate for other GI involvement.  #2 swelling to lower right leg  -Plan to obtain US ext rt venous to rule out clot -Likely venous stasis  -Recommend compression socks   PLAN Labs today PET/CT in 4 weeks [place] ext rt venous in 4 weeks  RTC with Dr Irene Limbo in 5 weeks with labs  All of the patients questions were answered with apparent satisfaction. The patient knows to call the clinic with any problems, questions or concerns.  I spent 45 minutes counseling the patient face to face. The total time spent in the appointment was 60 minutes and more than 50% was on counseling and direct patient cares.    Sullivan Lone MD MS AAHIVMS [facility] Hematology/Oncology Physician [facility]  (Office):       [phone] (Work cell):  [phone] (Fax):           [phone]  07/21/2017 1:59 PM  This document serves as a record of services  personally performed by Sullivan Lone, MD. It was created on his behalf by Alean Rinne, a trained medical scribe. The creation of this record is based on the scribe's personal observations and the provider's statements to them.   .I have reviewed the above documentation for accuracy and completeness, and I agree with the above. Brunetta Genera MD MS

## 2017-07-21 NOTE — Telephone Encounter (Signed)
Scheduled appt per 12/6 los - Gave patient AVS and calender per los 

## 2017-07-22 LAB — HEPATITIS C ANTIBODY

## 2017-07-22 LAB — HEPATITIS B CORE ANTIBODY, TOTAL: Hep B Core Ab, Tot: NEGATIVE

## 2017-07-22 LAB — HEPATITIS B SURFACE ANTIGEN: HBsAg Screen: NEGATIVE

## 2017-08-03 ENCOUNTER — Ambulatory Visit (INDEPENDENT_AMBULATORY_CARE_PROVIDER_SITE_OTHER): Payer: Medicare Other | Admitting: Cardiovascular Disease

## 2017-08-03 ENCOUNTER — Encounter: Payer: Self-pay | Admitting: Cardiovascular Disease

## 2017-08-03 VITALS — BP 140/65 | HR 52 | Ht 70.0 in | Wt 145.0 lb

## 2017-08-03 DIAGNOSIS — R609 Edema, unspecified: Secondary | ICD-10-CM

## 2017-08-03 DIAGNOSIS — E7800 Pure hypercholesterolemia, unspecified: Secondary | ICD-10-CM

## 2017-08-03 DIAGNOSIS — I447 Left bundle-branch block, unspecified: Secondary | ICD-10-CM | POA: Diagnosis not present

## 2017-08-03 DIAGNOSIS — I8390 Asymptomatic varicose veins of unspecified lower extremity: Secondary | ICD-10-CM | POA: Insufficient documentation

## 2017-08-03 DIAGNOSIS — I517 Cardiomegaly: Secondary | ICD-10-CM | POA: Diagnosis not present

## 2017-08-03 DIAGNOSIS — I83811 Varicose veins of right lower extremities with pain: Secondary | ICD-10-CM | POA: Diagnosis not present

## 2017-08-03 DIAGNOSIS — I2584 Coronary atherosclerosis due to calcified coronary lesion: Secondary | ICD-10-CM

## 2017-08-03 DIAGNOSIS — C8293 Follicular lymphoma, unspecified, intra-abdominal lymph nodes: Secondary | ICD-10-CM

## 2017-08-03 DIAGNOSIS — C8590 Non-Hodgkin lymphoma, unspecified, unspecified site: Secondary | ICD-10-CM

## 2017-08-03 DIAGNOSIS — Z5181 Encounter for therapeutic drug level monitoring: Secondary | ICD-10-CM

## 2017-08-03 DIAGNOSIS — I2510 Atherosclerotic heart disease of native coronary artery without angina pectoris: Secondary | ICD-10-CM | POA: Diagnosis not present

## 2017-08-03 DIAGNOSIS — E785 Hyperlipidemia, unspecified: Secondary | ICD-10-CM | POA: Diagnosis not present

## 2017-08-03 DIAGNOSIS — I10 Essential (primary) hypertension: Secondary | ICD-10-CM | POA: Diagnosis not present

## 2017-08-03 HISTORY — DX: Non-Hodgkin lymphoma, unspecified, unspecified site: C85.90

## 2017-08-03 HISTORY — DX: Essential (primary) hypertension: I10

## 2017-08-03 HISTORY — DX: Left bundle-branch block, unspecified: I44.7

## 2017-08-03 HISTORY — DX: Asymptomatic varicose veins of unspecified lower extremity: I83.90

## 2017-08-03 HISTORY — DX: Hyperlipidemia, unspecified: E78.5

## 2017-08-03 HISTORY — DX: Atherosclerotic heart disease of native coronary artery without angina pectoris: I25.10

## 2017-08-03 MED ORDER — SIMVASTATIN 40 MG PO TABS: 40.0000 mg | ORAL_TABLET | Freq: Every evening | ORAL | 5 refills | Status: DC

## 2017-08-03 NOTE — Patient Instructions (Addendum)
Medication Instructions:  INCREASE YOUR SIMVASTATIN TO 40 MG DAILY   Labwork: FASTING LP/CMET IN  6 WEEKS   Testing/Procedures: Your physician has requested that you have an echocardiogram. Echocardiography is a painless test that uses sound waves to create images of your heart. It provides your doctor with information about the size and shape of your heart and how well your heart's chambers and valves are working. This procedure takes approximately one hour. There are no restrictions for this procedure. [facility] [place] [address]   Follow-Up: Your physician wants you to follow-up in: [place] will receive a reminder letter in the mail two months in advance. If you don't receive a letter, please call our office to schedule the follow-up appointment.  CONSIDER GOING TO SEE VEIN AND VASCULAR FOR YOUR VARICOSE VEINS   If you need a refill on your cardiac medications before your next appointment, please call your pharmacy.  Echocardiogram An echocardiogram, or echocardiography, uses sound waves (ultrasound) to produce an image of your heart. The echocardiogram is simple, painless, obtained within a short period of time, and offers valuable information to your health care provider. The images from an echocardiogram can provide information such as:  Evidence of coronary artery disease (CAD).  Heart size.  Heart muscle function.  Heart valve function.  Aneurysm detection.  Evidence of a past heart attack.  Fluid buildup around the heart.  Heart muscle thickening.  Assess heart valve function.  Tell a health care provider about:  Any allergies you have.  All medicines you are taking, including vitamins, herbs, eye drops, creams, and over-the-counter medicines.  Any problems you or family members have had with anesthetic medicines.  Any blood disorders you have.  Any surgeries you have had.  Any medical conditions you have.  Whether you are pregnant or  may be pregnant. What happens before the procedure? No special preparation is needed. Eat and drink normally. What happens during the procedure?  In order to produce an image of your heart, gel will be applied to your chest and a wand-like tool (transducer) will be moved over your chest. The gel will help transmit the sound waves from the transducer. The sound waves will harmlessly bounce off your heart to allow the heart images to be captured in real-time motion. These images will then be recorded.  You may need an IV to receive a medicine that improves the quality of the pictures. What happens after the procedure? You may return to your normal schedule including diet, activities, and medicines, unless your health care provider tells you otherwise. This information is not intended to replace advice given to you by your health care provider. Make sure you discuss any questions you have with your health care provider. Document Released: 07/30/2000 Document Revised: 03/20/2016 Document Reviewed: 04/09/2013 Elsevier Interactive Patient Education  2017 [employer].

## 2017-08-03 NOTE — Progress Notes (Signed)
Cardiology Office Note   Date:  08/03/2017   ID:  Henry Nichols, DOB 1931-11-22, MRN [id number]  PCP:  [name], MD  Cardiologist:   Skeet Latch, MD   No chief complaint on file.    History of Present Illness: Henry Nichols is a 83 y.o. male with hypertension, hyperlipidemia, LBBB, mild COPD, newly diagnosed non-Hodgkin's lymphoma, and GERD who is being seen today for the evaluation of  at the request of Dr. Kathee Polite.  Henry Nichols underwent resection of a small bowel mass 06/29/17.  Prior to surgery he had a pre-op EKG that showed a LBBB that was previously unknown.  At the time of surgery it was noted that there was an adjacent soft tissue mass concerning for metastatic adenopathy.  Pathology revealed non-Hodgkin B-cell lymphoma.  CT scan 06/14/17 revealed mild cardiomegaly with coronary and aortic atherosclerosis.  He followed up with Dr. Sullivan Lone and is undergoing further testing prior to determining whether he will undergo an additional treatment plan.  He was noted to have swelling of the right lower leg that was thought to be due to venous stasis.  He reports that this edema has been present for years.  He intermittently has erythema of the lower leg.  He has tried to wear compression stockings but gets swelling just above where the stockings and.  He does not conscientiously elevate his legs when sitting.  Henry Nichols is very physically active.  He walks for exercise daily.  His son owns a [employer], [employer] and he goes there to help with cleaning up around the shop.  He has no chest pain or shortness of breath.  He has inhalers but never uses them.  He denies orthopnea or PND.  Prior to his surgery he had GI obstruction with eating.  This has since improved.  He denies palpitations, lightheadedness, or dizziness.  He had one fall that occurred when he lost his balance.  There is no preceding chest pain, palpitations, or lightheadedness.  His father died of a  heart attack at age 1.  However he smoked heavily.   Past Medical History:  Diagnosis Date  . Coronary artery calcification 08/03/2017  . Essential hypertension 08/03/2017  . GERD (gastroesophageal reflux disease)   . HOH (hard of hearing)   . Hyperlipidemia 08/03/2017  . Hypertension   . LBBB (left bundle branch block) 08/03/2017  . Non-Hodgkin lymphoma ([place]) 08/03/2017  . Pneumonia    hx  . Small bowel mass   . Varicose vein of leg 08/03/2017    Past Surgical History:  Procedure Laterality Date  . EYE SURGERY Bilateral 2016   cataracts  . HERNIA REPAIR Bilateral 2008   inguinal  . LAPAROSCOPIC SMALL BOWEL RESECTION  07/06/2017  . LAPAROSCOPIC SMALL BOWEL RESECTION N/A 07/06/2017   Procedure: LAPAROSCOPIC ASSISTED SMALL BOWEL RESECTION;  Surgeon: Coralie Keens, MD;  Location: [facility];  Service: General;  Laterality: N/A;     Current Outpatient Medications  Medication Sig Dispense Refill  . acetaminophen (TYLENOL) 325 MG tablet Take 2 tablets (650 mg total) by mouth every 6 (six) hours as needed for mild pain or moderate pain.    Marland Kitchen albuterol (PROVENTIL HFA;VENTOLIN HFA) 108 (90 Base) MCG/ACT inhaler Inhale 1 puff every 6 (six) hours as needed into the lungs for wheezing or shortness of breath.    Marland Kitchen aspirin EC 81 MG tablet Take 81 mg daily by mouth.    Marland Kitchen atenolol-chlorthalidone (TENORETIC) 100-25 MG tablet Take 0.5  tablets daily by mouth.  1  . Multiple Vitamins-Minerals (PRESERVISION AREDS 2 PO) Take 1 capsule 2 (two) times daily by mouth.    . pantoprazole (PROTONIX) 40 MG tablet Take 40 mg daily by mouth.  3  . potassium chloride SA (K-DUR,KLOR-CON) 20 MEQ tablet Take 40 mEq daily by mouth.    . simvastatin (ZOCOR) 40 MG tablet Take 1 tablet (40 mg total) by mouth every evening. 30 tablet 5  . triamcinolone cream (KENALOG) 0.1 % Apply 1 application daily as needed topically (for dry skin).     No current facility-administered medications for this visit.      Allergies:   Patient has no known allergies.    Social History:  The patient  reports that he quit smoking about 33 years ago. His smoking use included cigars. he has never used smokeless tobacco. He reports that he does not drink alcohol or use drugs.   Family History:  The patient's family history includes Alzheimer's disease in his mother; Heart attack in his father; Heart disease in his mother.    ROS:  Please see the history of present illness.   Otherwise, review of systems are positive for none.   All other systems are reviewed and negative.    PHYSICAL EXAM: VS:  BP 140/65 (BP Location: Right Arm)   Pulse (!) 52   Ht 5\' 10"  (1.778 m)   Wt 145 lb (65.8 kg)   SpO2 100%   BMI 20.81 kg/m  , BMI Body mass index is 20.81 kg/m. GENERAL:  Well appearing.  No acute distress HEENT:  Pupils equal round and reactive, fundi not visualized, oral mucosa unremarkable NECK:  No jugular venous distention, waveform within normal limits, carotid upstroke brisk and symmetric, no bruits, no thyromegaly LYMPHATICS:  No cervical adenopathy LUNGS:  Clear to auscultation bilaterally HEART:  RRR.  PMI not displaced or sustained,S1 and S2 within normal limits, no S3, no S4, no clicks, no rubs, no murmurs ABD:  Flat, positive bowel sounds normal in frequency in pitch, no bruits, no rebound, no guarding, no midline pulsatile mass, no hepatomegaly, no splenomegaly EXT:  2 plus pulses throughout, bilateral LE edema to mid tibia R>L.  Erythema of the R LE.  R LE varicose veins.  Number no cyanosis no clubbing SKIN:  No rashes no nodules NEURO:  Cranial nerves II through XII grossly intact, motor grossly intact throughout PSYCH:  Cognitively intact, oriented to person place and time   EKG:  EKG is ordered today. The ekg ordered today demonstrates sinus bradycardia.  Rate 52 bpm.  LBBB.     Recent Labs: 07/21/2017: ALT 9; BUN 14.8; Creatinine 1.4; HGB 10.8; Platelets 277; Potassium 4.5; Sodium 142    10/ 24/18: Sodium 141, potassium 3.0, BUN 29, creatinine 1.4 AST 20, ALT 11 WBC 5.0, hemoglobin 12.2, hematocrit 35.4, platelets 247  12/06/16: Cholesterol 176, triglycerides 89, HDL 45, LDL 113 TSH 2.51  Lipid Panel No results found for: CHOL, TRIG, HDL, CHOLHDL, VLDL, LDLCALC, LDLDIRECT    Wt Readings from Last 3 Encounters:  08/03/17 145 lb (65.8 kg)  07/21/17 140 lb 11.2 oz (63.8 kg)  07/06/17 141 lb 15.6 oz (64.4 kg)      ASSESSMENT AND PLAN:  # LBBB:  Not clinically significant.  He is very physically active and has no symptoms of ischemia.    # Hypertension:  BP elevated today but has otherwise been well-controlled in the 110s-120s.  No changes for now.  Continue amlodipine and  chlorthalidone.   # Asymptomatic coronary calcification: # Hyperlipidemia: Henry Nichols has no symptoms and is very physically active.  No need for ischemia evaluation.  Continue aspirin.  LDL was 113 11/2016.  Increase simvastatin to 40mg .  Check lipids and CMP in 6 weeks.  # Varicose veins: # Lower extremity edema: He is already scheduled for venous Dopplers.  Given the chronicity suspect this is more related to venous insufficiency.  We discussed the importance of wearing compression stockings but he does not think they are helpful.  I asked him to elevate his legs when sitting.  He will consider a referral to vein and vascular.  We will also get an echo, especially given his cardiomegaly on chest CT.    Current medicines are reviewed at length with the patient today.  The patient does not have concerns regarding medicines.  The following changes have been made:  Increase simvastatin.   Labs/ tests ordered today include:   Orders Placed This Encounter  Procedures  . Lipid panel  . Comprehensive metabolic panel  . EKG 12-Lead  . ECHOCARDIOGRAM COMPLETE     Disposition:   FU with Nashya Garlington C. Oval Linsey, MD, [facility] in 1 year.     This note was written with the assistance of speech  recognition software.  Please excuse any transcriptional errors.  Signed, Tynlee Bayle C. Oval Linsey, MD, [facility]  08/03/2017 11:19 AM    [place]

## 2017-08-22 ENCOUNTER — Encounter (HOSPITAL_COMMUNITY)
Admission: RE | Admit: 2017-08-22 | Discharge: 2017-08-22 | Disposition: A | Discharge status: Home / Self Care | Payer: Medicare Other | Source: Ambulatory Visit | Attending: Hematology | Admitting: Hematology

## 2017-08-22 DIAGNOSIS — C8290 Follicular lymphoma, unspecified, unspecified site: Secondary | ICD-10-CM | POA: Diagnosis not present

## 2017-08-22 DIAGNOSIS — Z8572 Personal history of non-Hodgkin lymphomas: Secondary | ICD-10-CM | POA: Diagnosis not present

## 2017-08-22 DIAGNOSIS — C8599 Non-Hodgkin lymphoma, unspecified, extranodal and solid organ sites: Secondary | ICD-10-CM | POA: Insufficient documentation

## 2017-08-22 LAB — GLUCOSE, CAPILLARY: Glucose-Capillary: 95 mg/dL (ref 65–99)

## 2017-08-22 MED ORDER — FLUDEOXYGLUCOSE F - 18 (FDG) INJECTION
7.2000 | Freq: Once | INTRAVENOUS | Status: AC | PRN
  Administered 2017-08-22: 7.2 via INTRAVENOUS

## 2017-08-23 NOTE — Progress Notes (Signed)
HEMATOLOGY/ONCOLOGY CONSULTATION NOTE  Date of Service: 08/24/2017  Patient Care Team: [name], MD as PCP - General (Family Medicine)  CHIEF COMPLAINTS/PURPOSE OF CONSULTATION:  Newly diagnosed small bowel follicular lymphoma  HISTORY OF PRESENTING ILLNESS:   Henry Nichols is a wonderful 84 y.o. male who has been referred to [place] by Dr [name], MD for evaluation and management of a small bowel follicular lymphoma.   He presented to ED for abdominal pain and had a CT chest/abd/pelvis on 06/09/2017 with results showing: IMPRESSION: 1. Bilateral pulmonary parenchymal opacities, peribronchovascular in distribution. Favor infection or (especially given the clinical history of vomiting) aspiration. Neoplastic processes are felt unlikely but cannot be entirely excluded. Especially if the patient is diagnosed with lymphoma, consider CT followup after therapy to confirm resolution. 2. No thoracic adenopathy. 3. Mild cardiomegaly. Coronary artery atherosclerosis. Aortic Atherosclerosis (ICD10-I70.0). 4. Development of small bowel dilatation, suspicious for obstruction at the site of distal small bowel thickening on prior exam. 5. Borderline ascending aortic aneurysm. Recommend annual imaging followup by CTA or MRA. This recommendation follows 2010 ACCF/AHA/AATS/ACR/ASA/SCA/SCAI/SIR/STS/SVM Guidelines for the Diagnosis and Management of Patients with Thoracic Aortic Disease. Circulation. 2010; 121: I347-Q259. 6. Nonspecific left upper lobe pulmonary nodule.   He is accompanied by his son today. He states he is doing well overall. He reports noticing a difficulty swallowing and nausea/vomiting after eating. He saw his PCP for this and had a CT scan. He was then referred to Dr. [name] and underwent a laparoscopic small bowel resection with resoltion of symptoms.  He states he wasn't losing much weight before these symptoms began and lost 15 pounds after.   Pathology showed follicular  lymphoma   On review of systems, pt reports vomiting, constipation, weight loss, swelling and skin rash to the right ankle, and denies SOB, cough, bloody stools, diarrhea, fever, chills, night sweats and any other accompanying symptoms.   INTERVAL HISTORY   Henry Nichols is here for a follow up of his Grade 3A Follicular Non-hodgkin's Lymphoma. Of note, since last visit he had PET scan on 08/22/17 which showed Isolated hypermetabolic pericolic mesenteric nodal mass, consistent with active lymphoma that has progressed since last scan. No other evidence of disease was found.   He presents to the clinic today noting his appetite has improved and he has gained some weight back to 146lbs. He notes his bowels will get tight occasionally but still able to pass BM well. He urinates every 2-3 hours since his surgery. He denies abdominal pain. He reports his lower leg edema is still there with little change. His doppler was negative for DVT today 08/24/2016  MEDICAL HISTORY:  Past Medical History:  Diagnosis Date  . Coronary artery calcification 08/03/2017  . Essential hypertension 08/03/2017  . GERD (gastroesophageal reflux disease)   . HOH (hard of hearing)   . Hyperlipidemia 08/03/2017  . Hypertension   . LBBB (left bundle branch block) 08/03/2017  . Non-Hodgkin lymphoma ([place]) 08/03/2017  . Pneumonia    hx  . Small bowel mass   . Varicose vein of leg 08/03/2017    SURGICAL HISTORY: Past Surgical History:  Procedure Laterality Date  . EYE SURGERY Bilateral 2016   cataracts  . HERNIA REPAIR Bilateral 2008   inguinal  . LAPAROSCOPIC SMALL BOWEL RESECTION  07/06/2017  . LAPAROSCOPIC SMALL BOWEL RESECTION N/A 07/06/2017   Procedure: LAPAROSCOPIC ASSISTED SMALL BOWEL RESECTION;  Surgeon: Coralie Keens, MD;  Location: [facility];  Service: General;  Laterality: N/A;  SOCIAL HISTORY: Social History   Socioeconomic History  . Marital status: Widowed    Spouse name: Not on file  . Number  of children: Not on file  . Years of education: Not on file  . Highest education level: Not on file  Social Needs  . Financial resource strain: Not on file  . Food insecurity - worry: Not on file  . Food insecurity - inability: Not on file  . Transportation needs - medical: Not on file  . Transportation needs - non-medical: Not on file  Occupational History  . Not on file  Tobacco Use  . Smoking status: Former Smoker    Types: Cigars    Last attempt to quit: 06/29/1984    Years since quitting: 33.1  . Smokeless tobacco: Never Used  Substance and Sexual Activity  . Alcohol use: No    Frequency: Never  . Drug use: No  . Sexual activity: Not on file  Other Topics Concern  . Not on file  Social History Narrative  . Not on file    FAMILY HISTORY: Family History  Problem Relation Age of Onset  . Alzheimer's disease Mother   . Heart disease Mother   . Heart attack Father     ALLERGIES:  has No Known Allergies.  MEDICATIONS:  Current Outpatient Medications  Medication Sig Dispense Refill  . acetaminophen (TYLENOL) 325 MG tablet Take 2 tablets (650 mg total) by mouth every 6 (six) hours as needed for mild pain or moderate pain.    Marland Kitchen albuterol (PROVENTIL HFA;VENTOLIN HFA) 108 (90 Base) MCG/ACT inhaler Inhale 1 puff every 6 (six) hours as needed into the lungs for wheezing or shortness of breath.    Marland Kitchen aspirin EC 81 MG tablet Take 81 mg daily by mouth.    Marland Kitchen atenolol-chlorthalidone (TENORETIC) 100-25 MG tablet Take 0.5 tablets daily by mouth.  1  . Multiple Vitamins-Minerals (PRESERVISION AREDS 2 PO) Take 1 capsule 2 (two) times daily by mouth.    . pantoprazole (PROTONIX) 40 MG tablet Take 40 mg daily by mouth.  3  . potassium chloride SA (K-DUR,KLOR-CON) 20 MEQ tablet Take 40 mEq daily by mouth.    . simvastatin (ZOCOR) 40 MG tablet Take 1 tablet (40 mg total) by mouth every evening. 30 tablet 5  . triamcinolone cream (KENALOG) 0.1 % Apply 1 application daily as needed topically  (for dry skin).     No current facility-administered medications for this visit.     REVIEW OF SYSTEMS:    10 Point review of Systems was done is negative except as noted above.  PHYSICAL EXAMINATION: ECOG PERFORMANCE STATUS: 1 - Symptomatic but completely ambulatory  . Vitals:   08/24/17 1500  BP: 139/65  Pulse: (!) 51  Resp: 16  Temp: 97.8 F (36.6 C)  SpO2: 100%   Filed Weights   08/24/17 1500  Weight: 146 lb 3.2 oz (66.3 kg)   .Body mass index is 20.98 kg/m.  GENERAL:alert, in no acute distress and comfortable SKIN: no acute rashes, no significant lesions EYES: conjunctiva are pink and non-injected, sclera anicteric OROPHARYNX: MMM, no exudates, no oropharyngeal erythema or ulceration NECK: supple, no JVD LYMPH:  no palpable lymphadenopathy in the cervical, axillary or inguinal regions LUNGS: clear to auscultation b/l with normal respiratory effort HEART: regular rate & rhythm ABDOMEN:  normoactive bowel sounds , non tender, not distended. Healed surgical incisions Extremity: Mild bilateral distal lower extremity edema PSYCH: alert & oriented x 3 with fluent speech NEURO: no  focal motor/sensory deficits  LABORATORY DATA:  I have reviewed the data as listed  Component     Latest Ref Rng & Units 08/24/2017  WBC     4.0 - 10.3 K/uL 5.0  RBC     4.20 - 5.82 MIL/uL 3.65 (L)  Hemoglobin     13.0 - 17.1 g/dL 11.6 (L)  HCT     38.4 - 49.9 % 35.8 (L)  MCV     79.3 - 98.0 fL 98.1 (H)  MCH     27.2 - 33.4 pg 31.8  MCHC     32.0 - 36.0 g/dL 32.4  RDW     11.0 - 15.6 % 14.1  Platelets     140 - 400 K/uL 188  Neutrophils     % 50  NEUT#     1.5 - 6.5 K/uL 2.5  Abs Granulocyte     1.5 - 6.5 K/uL 2.5  Lymphocytes     % 30  Lymphocyte #     0.9 - 3.3 K/uL 1.5  Monocytes Relative     % 10  Monocyte #     0.1 - 0.9 K/uL 0.5  Eosinophil     % 9  Eosinophils Absolute     0.0 - 0.5 K/uL 0.5  Basophil     % 1  Basophils Absolute     0.0 - 0.1 K/uL 0.1    Retic Ct Pct     0.8 - 1.8 % 1.4  RBC.     4.20 - 5.82 MIL/uL 3.65 (L)  Retic Count, Absolute     34.8 - 93.9 K/uL 51.1  LDH     125 - 245 U/L 158    CBC Latest Ref Rng & Units 08/24/2017 07/21/2017 07/07/2017  WBC 4.0 - 10.3 K/uL 5.0 5.1 8.3  Hemoglobin 13.0 - 17.1 g/dL 11.6(L) 10.8(L) 10.7(L)  Hematocrit 38.4 - 49.9 % 35.8(L) 34.1(L) 32.3(L)  Platelets 140 - 400 K/uL 188 277 212    . CMP Latest Ref Rng & Units 08/24/2017 07/21/2017 07/07/2017  Glucose 70 - 140 mg/dL 93 85 126(H)  BUN 7 - 26 mg/dL 15 14.8 9  Creatinine 0.70 - 1.30 mg/dL 1.18 1.4(H) 1.04  Sodium 136 - 145 mmol/L 142 142 138  Potassium 3.5 - 5.1 mmol/L 4.0 4.5 3.8  Chloride 98 - 109 mmol/L 104 - 104  CO2 22 - 29 mmol/L 30(H) 31(H) 27  Calcium 8.4 - 10.4 mg/dL 9.5 9.1 8.5(L)  Total Protein 6.4 - 8.3 g/dL 7.1 6.8 -  Total Bilirubin 0.2 - 1.2 mg/dL 1.6(H) 1.09 -  Alkaline Phos 40 - 150 U/L 63 65 -  AST 5 - 34 U/L 20 15 -  ALT 0 - 55 U/L 14 9 -       CT CHEST ABD 06/14/2017 IMPRESSION: 1. Bilateral pulmonary parenchymal opacities, peribronchovascular in distribution. Favor infection or (especially given the clinical history of vomiting) aspiration. Neoplastic processes are felt unlikely but cannot be entirely excluded. Especially if the patient is diagnosed with lymphoma, consider CT followup after therapy to confirm resolution. 2. No thoracic adenopathy. 3. Mild cardiomegaly. Coronary artery atherosclerosis. Aortic Atherosclerosis (ICD10-I70.0). 4. Development of small bowel dilatation, suspicious for obstruction at the site of distal small bowel thickening on prior exam. 5. Borderline ascending aortic aneurysm. Recommend annual imaging followup by CTA or MRA. This recommendation follows 2010 ACCF/AHA/AATS/ACR/ASA/SCA/SCAI/SIR/STS/SVM Guidelines for the Diagnosis and Management of Patients with Thoracic Aortic Disease. Circulation. 2010; 121: F163-W466. 6. Nonspecific left upper  lobe pulmonary  nodule.  RADIOGRAPHIC STUDIES: I have personally reviewed the radiological images as listed and agreed with the findings in the report. Nm Pet Image Initial (pi) Skull Base To Thigh  Result Date: 08/22/2017 CLINICAL DATA:  Initial treatment strategy for follicular lymphoma. History of non-Hodgkin's lymphoma and small bowel mass. EXAM: NUCLEAR MEDICINE PET SKULL BASE TO THIGH TECHNIQUE: 7.2 MCi F-18 FDG was injected intravenously. Full-ring PET imaging was performed from the skull base to thigh after the radiotracer. CT data was obtained and used for attenuation correction and anatomic localization. FASTING BLOOD GLUCOSE:  Value: 95 mg/dl COMPARISON:  Abdominopelvic CT 06/09/2017.  Chest CT of 06/14/2017 FINDINGS: NECK: No areas of abnormal hypermetabolism. No cervical adenopathy. Bilateral carotid atherosclerosis. Mucosal thickening of bilateral ethmoid air cells and left sphenoid sinus. CHEST: No pulmonary parenchymal or thoracic nodal hypermetabolism identified. Mild cardiomegaly with multivessel coronary artery atherosclerosis. No thoracic adenopathy. Clearing of pulmonary opacities since the prior diagnostic CT. ABDOMEN/PELVIS: Hypermetabolic ileocolic mesenteric nodal mass. 3.3 x 2.5 cm and a S.U.V. max of 11.1 on image 142/series 4. Adenopathy in this region measured maximally 1.4 x 1.8 cm on the prior diagnostic CT (when remeasured). Interval with surgical sutures in the adjacent small bowel. Left renal cyst. Bilateral renal scarring. Too small to characterize interpolar left renal lesion. Cholelithiasis. Abdominal aortic atherosclerosis. Underdistended right pelvic small bowel, including on image 170/series 4. Moderate prostatomegaly. SKELETON: No abnormal marrow activity. Bilateral hip osteoarthritis. Degenerative partial fusion of the bilateral sacroiliac joints. IMPRESSION: 1. Isolated hypermetabolic pericolic mesenteric nodal mass,consistent with active lymphoma. This is progressive since  06/09/2017. 2. No other evidence of hypermetabolic active lymphoma. 3. Coronary artery atherosclerosis. Aortic Atherosclerosis (ICD10-I70.0). 4. Cholelithiasis. Electronically Signed   By: Abigail Miyamoto M.D.   On: 08/22/2017 14:25    ASSESSMENT & PLAN:   Henry Nichols is a wonderful 84 y.o. 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 No type B symptoms  PLAN -I reviewed the 08/22/17 PET with the patient which showed a Hypermetabolic ileocolic mesenteric nodal mass at surgery site consistent with active lymphoma which has progressed since last scan. No other evidence of disease elsewhere.  -I do not recommend more aggressive standard chemo treatment due to his advanced age and low tumor burden.  -Given the follicular is high grade lymphoma and progressive would recommend treatment with weekly Rituxan for 4 weeks likely followed by maintenance Rituxan q61months for 1 year. The other option discussed was to start with observation before treating. With evidence of active growth already I suggest starting with the weekly Rituxan.  -patient is agreeable with Rituxan treatment approach  -Will set up chemo education class for more information on this treatment.  -plan to start treatment in 2 weeks    #2 swelling to lower right leg  -Likely venous stasis  -[place] was negative for DVT -ECHO from 08/24/17 shows normal EF PLAN -I again recommend compression socks -Will monitor   Chemo-counseling for Weekly Rituxan Plz schedule for Rituxan weekly x 4 doses within the next 1-2 weeks RTC with Dr Irene Limbo on [place] with labs  All of the patients questions were answered with apparent satisfaction. The patient knows to call the clinic with any problems, questions or concerns.  I spent 20 minutes counseling the patient face to face. The total time spent in the appointment was 30 minutes and more than 50% was on counseling and  direct patient cares.  Sullivan Lone MD [place] AAHIVMS [facility] Hematology/Oncology Physician [facility]  (Office):       [phone] (Work cell):  [phone] (Fax):           [phone]  08/24/2017 3:46 PM  This document serves as a record of services personally performed by Sullivan Lone, MD. It was created on his behalf by Joslyn Devon, a trained medical scribe. The creation of this record is based on the scribe's personal observations and the provider's statements to them.    .I have reviewed the above documentation for accuracy and completeness, and I agree with the above.   Brunetta Genera MD MS

## 2017-08-24 ENCOUNTER — Encounter: Payer: Self-pay | Admitting: Hematology

## 2017-08-24 ENCOUNTER — Ambulatory Visit (HOSPITAL_COMMUNITY)
Admission: RE | Admit: 2017-08-24 | Discharge: 2017-08-24 | Disposition: A | Discharge status: Home / Self Care | Payer: Medicare Other | Source: Ambulatory Visit | Attending: Hematology | Admitting: Hematology

## 2017-08-24 ENCOUNTER — Telehealth: Payer: Self-pay | Admitting: *Deleted

## 2017-08-24 ENCOUNTER — Other Ambulatory Visit: Payer: Self-pay

## 2017-08-24 ENCOUNTER — Ambulatory Visit (HOSPITAL_BASED_OUTPATIENT_CLINIC_OR_DEPARTMENT_OTHER): Payer: Medicare Other

## 2017-08-24 ENCOUNTER — Telehealth: Payer: Self-pay | Admitting: Hematology

## 2017-08-24 ENCOUNTER — Encounter (HOSPITAL_COMMUNITY): Payer: Medicare Other

## 2017-08-24 ENCOUNTER — Encounter (INDEPENDENT_AMBULATORY_CARE_PROVIDER_SITE_OTHER): Payer: Self-pay

## 2017-08-24 ENCOUNTER — Inpatient Hospital Stay: Payer: Medicare Other | Attending: Hematology | Admitting: Hematology

## 2017-08-24 ENCOUNTER — Inpatient Hospital Stay: Payer: Medicare Other

## 2017-08-24 DIAGNOSIS — C8230 Follicular lymphoma grade IIIa, unspecified site: Secondary | ICD-10-CM | POA: Insufficient documentation

## 2017-08-24 DIAGNOSIS — C8599 Non-Hodgkin lymphoma, unspecified, extranodal and solid organ sites: Secondary | ICD-10-CM

## 2017-08-24 DIAGNOSIS — Z87891 Personal history of nicotine dependence: Secondary | ICD-10-CM | POA: Diagnosis not present

## 2017-08-24 DIAGNOSIS — C8290 Follicular lymphoma, unspecified, unspecified site: Secondary | ICD-10-CM | POA: Insufficient documentation

## 2017-08-24 DIAGNOSIS — C8233 Follicular lymphoma grade IIIa, intra-abdominal lymph nodes: Secondary | ICD-10-CM

## 2017-08-24 DIAGNOSIS — K5900 Constipation, unspecified: Secondary | ICD-10-CM | POA: Insufficient documentation

## 2017-08-24 DIAGNOSIS — I517 Cardiomegaly: Secondary | ICD-10-CM | POA: Insufficient documentation

## 2017-08-24 DIAGNOSIS — K8020 Calculus of gallbladder without cholecystitis without obstruction: Secondary | ICD-10-CM | POA: Diagnosis not present

## 2017-08-24 DIAGNOSIS — Z7189 Other specified counseling: Secondary | ICD-10-CM

## 2017-08-24 DIAGNOSIS — Z79899 Other long term (current) drug therapy: Secondary | ICD-10-CM | POA: Diagnosis not present

## 2017-08-24 DIAGNOSIS — Z5111 Encounter for antineoplastic chemotherapy: Secondary | ICD-10-CM | POA: Diagnosis not present

## 2017-08-24 DIAGNOSIS — I10 Essential (primary) hypertension: Secondary | ICD-10-CM | POA: Diagnosis not present

## 2017-08-24 DIAGNOSIS — I447 Left bundle-branch block, unspecified: Secondary | ICD-10-CM | POA: Diagnosis not present

## 2017-08-24 DIAGNOSIS — E785 Hyperlipidemia, unspecified: Secondary | ICD-10-CM | POA: Insufficient documentation

## 2017-08-24 DIAGNOSIS — N400 Enlarged prostate without lower urinary tract symptoms: Secondary | ICD-10-CM | POA: Insufficient documentation

## 2017-08-24 DIAGNOSIS — R609 Edema, unspecified: Secondary | ICD-10-CM

## 2017-08-24 DIAGNOSIS — K219 Gastro-esophageal reflux disease without esophagitis: Secondary | ICD-10-CM | POA: Diagnosis not present

## 2017-08-24 DIAGNOSIS — Z7982 Long term (current) use of aspirin: Secondary | ICD-10-CM | POA: Diagnosis not present

## 2017-08-24 DIAGNOSIS — I083 Combined rheumatic disorders of mitral, aortic and tricuspid valves: Secondary | ICD-10-CM | POA: Insufficient documentation

## 2017-08-24 DIAGNOSIS — I700 Atherosclerosis of aorta: Secondary | ICD-10-CM | POA: Diagnosis not present

## 2017-08-24 DIAGNOSIS — I712 Thoracic aortic aneurysm, without rupture: Secondary | ICD-10-CM | POA: Diagnosis not present

## 2017-08-24 DIAGNOSIS — M7989 Other specified soft tissue disorders: Secondary | ICD-10-CM | POA: Diagnosis not present

## 2017-08-24 LAB — CBC WITH DIFFERENTIAL/PLATELET
Abs Granulocyte: 2.5 10*3/uL (ref 1.5–6.5)
Basophils Absolute: 0.1 10*3/uL (ref 0.0–0.1)
Basophils Relative: 1 %
EOS PCT: 9 %
Eosinophils Absolute: 0.5 10*3/uL (ref 0.0–0.5)
HCT: 35.8 % — ABNORMAL LOW (ref 38.4–49.9)
Hemoglobin: 11.6 g/dL — ABNORMAL LOW (ref 13.0–17.1)
LYMPHS ABS: 1.5 10*3/uL (ref 0.9–3.3)
LYMPHS PCT: 30 %
MCH: 31.8 pg (ref 27.2–33.4)
MCHC: 32.4 g/dL (ref 32.0–36.0)
MCV: 98.1 fL — ABNORMAL HIGH (ref 79.3–98.0)
MONO ABS: 0.5 10*3/uL (ref 0.1–0.9)
Monocytes Relative: 10 %
Neutro Abs: 2.5 10*3/uL (ref 1.5–6.5)
Neutrophils Relative %: 50 %
PLATELETS: 188 10*3/uL (ref 140–400)
RBC: 3.65 MIL/uL — ABNORMAL LOW (ref 4.20–5.82)
RDW: 14.1 % (ref 11.0–15.6)
WBC: 5 10*3/uL (ref 4.0–10.3)

## 2017-08-24 LAB — COMPREHENSIVE METABOLIC PANEL
ALBUMIN: 3.8 g/dL (ref 3.5–5.0)
ALK PHOS: 63 U/L (ref 40–150)
ALT: 14 U/L (ref 0–55)
AST: 20 U/L (ref 5–34)
Anion gap: 8 (ref 3–11)
BILIRUBIN TOTAL: 1.6 mg/dL — AB (ref 0.2–1.2)
BUN: 15 mg/dL (ref 7–26)
CALCIUM: 9.5 mg/dL (ref 8.4–10.4)
CO2: 30 mmol/L — AB (ref 22–29)
CREATININE: 1.18 mg/dL (ref 0.70–1.30)
Chloride: 104 mmol/L (ref 98–109)
GFR calc Af Amer: >60 mL/min (ref >60)
GFR calc non Af Amer: 55 mL/min — ABNORMAL LOW (ref >60)
GLUCOSE: 93 mg/dL (ref 70–140)
Potassium: 4 mmol/L (ref 3.5–5.1)
SODIUM: 142 mmol/L (ref 136–145)
Total Protein: 7.1 g/dL (ref 6.4–8.3)

## 2017-08-24 LAB — RETICULOCYTES
RBC.: 3.65 MIL/uL — ABNORMAL LOW (ref 4.20–5.82)
Retic Count, Absolute: 51.1 10*3/uL (ref 34.8–93.9)
Retic Ct Pct: 1.4 % (ref 0.8–1.8)

## 2017-08-24 LAB — LACTATE DEHYDROGENASE: LDH: 158 U/L (ref 125–245)

## 2017-08-24 NOTE — Telephone Encounter (Signed)
Scheduled appt per 1/9 los - unable to schedule treatment due to capped day - logged - patient aware they will be contacted when appt scheduled.

## 2017-08-24 NOTE — Progress Notes (Signed)
START ON PATHWAY REGIMEN - Lymphoma and CLL     Administer weekly:     Rituximab   **Always confirm dose/schedule in your pharmacy ordering system**    Patient Characteristics: Follicular Lymphoma, Grades 1, 2, and 3A, First Line, Stage III / IV, Asymptomatic or Low Bulk Disease Disease Type: Follicular Lymphoma Disease Type: Not Applicable Disease Type: Not Applicable [place] Stage: IV Tumor Grade: 3A Line of therapy: First Line Disease Characteristics: Asymptomatic or Low Bulk Disease Intent of Therapy: Non-Curative / Palliative Intent, Discussed with Patient

## 2017-08-24 NOTE — Patient Instructions (Signed)
Thank you for choosing [facility] to provide your oncology and hematology care.  To afford each patient quality time with our providers, please arrive 30 minutes before your scheduled appointment time.  If you arrive late for your appointment, you may be asked to reschedule.  We strive to give you quality time with our providers, and arriving late affects you and other patients whose appointments are after yours.   If you are a no show for multiple scheduled visits, you may be dismissed from the clinic at the providers discretion.    Again, thank you for choosing [facility], our hope is that these requests will decrease the amount of time that you wait before being seen by our physicians.  ______________________________________________________________________  Should you have questions after your visit to the [facility], please contact our office at [phone] between the hours of 8:30 and 4:30 p.m.    Voicemails left after 4:30p.m will not be returned until the following business day.    For prescription refill requests, please have your pharmacy contact us directly.  Please also try to allow 48 hours for prescription requests.    Please contact the scheduling department for questions regarding scheduling.  For scheduling of procedures such as PET scans, CT scans, MRI, Ultrasound, etc please contact central scheduling at [phone].    Resources For Cancer Patients and Caregivers:   Oncolink.org:  A wonderful resource for patients and healthcare providers for information regarding your disease, ways to tract your treatment, what to expect, etc.     American Cancer Society:  800-227-2345  Can help patients locate various types of support and financial assistance  Cancer Care: 1-800-813-HOPE (4673) Provides financial assistance, online support groups, medication/co-pay assistance.    [place] DSS:  336-641-3447 Where to apply for food  stamps, Medicaid, and utility assistance  Medicare Rights Center: 800-333-4114 Helps people with Medicare understand their rights and benefits, navigate the Medicare system, and secure the quality healthcare they deserve  SCAT: 336-333-6589 [place] [employer]'s shared-ride transportation service for eligible riders who have a disability that prevents them from riding the fixed route bus.    For additional information on assistance programs please contact our social worker:   Grier Hock/Abigail Elmore:  [phone]            

## 2017-08-24 NOTE — Progress Notes (Signed)
Right lower extremity venous duplex completed. No evidence of DVT, superficial thrombosis, or Baker's cyst. Moderate interstitial fluid noted in the lower leg. [facility], [place] 08/24/2017 12:54 PM

## 2017-08-24 NOTE — Telephone Encounter (Signed)
Call report from [place] with [facility].    "Patient is negative for right lower extremityvenous doppler."   Next scheduled lab/F/U today at 2:00 and 3:00 pm.  [place] reports patient aware of today's appointments.   Routing call information to collaborative nurse and provider for review.  Further patient communication through collaborative nurse.

## 2017-08-25 ENCOUNTER — Telehealth: Payer: Self-pay | Admitting: Hematology

## 2017-08-25 NOTE — Telephone Encounter (Signed)
Scheduled appt per 1/9 los - Added 4 cycles of weekly treatment - patient is aware - calender to be printed at chemo edu class.

## 2017-08-30 ENCOUNTER — Inpatient Hospital Stay: Payer: Medicare Other

## 2017-08-30 ENCOUNTER — Encounter: Payer: Self-pay | Admitting: *Deleted

## 2017-09-01 NOTE — Progress Notes (Signed)
HEMATOLOGY/ONCOLOGY CONSULTATION NOTE  Date of Service: 09/02/2017  Patient Care Team: [name], MD as PCP - General (Family Medicine)  CHIEF COMPLAINTS/PURPOSE OF CONSULTATION:  Newly diagnosed small bowel follicular lymphoma  HISTORY OF PRESENTING ILLNESS:   Henry Nichols is a wonderful 84 y.o. male who has been referred to [place] by Dr [name], MD for evaluation and management of a small bowel follicular lymphoma.   He presented to ED for abdominal pain and had a CT chest/abd/pelvis on 06/09/2017 with results showing: IMPRESSION: 1. Bilateral pulmonary parenchymal opacities, peribronchovascular in distribution. Favor infection or (especially given the clinical history of vomiting) aspiration. Neoplastic processes are felt unlikely but cannot be entirely excluded. Especially if the patient is diagnosed with lymphoma, consider CT followup after therapy to confirm resolution. 2. No thoracic adenopathy. 3. Mild cardiomegaly. Coronary artery atherosclerosis. Aortic Atherosclerosis (ICD10-I70.0). 4. Development of small bowel dilatation, suspicious for obstruction at the site of distal small bowel thickening on prior exam. 5. Borderline ascending aortic aneurysm. Recommend annual imaging followup by CTA or MRA. This recommendation follows 2010 ACCF/AHA/AATS/ACR/ASA/SCA/SCAI/SIR/STS/SVM Guidelines for the Diagnosis and Management of Patients with Thoracic Aortic Disease. Circulation. 2010; 121: R740-C144. 6. Nonspecific left upper lobe pulmonary nodule.   He is accompanied by his son today. He states he is doing well overall. He reports noticing a difficulty swallowing and nausea/vomiting after eating. He saw his PCP for this and had a CT scan. He was then referred to Dr. [name] and underwent a laparoscopic small bowel resection with resoltion of symptoms.  He states he wasn't losing much weight before these symptoms began and lost 15 pounds after.   Pathology showed follicular  lymphoma   On review of systems, pt reports vomiting, constipation, weight loss, swelling and skin rash to the right ankle, and denies SOB, cough, bloody stools, diarrhea, fever, chills, night sweats and any other accompanying symptoms.   INTERVAL HISTORY   Henry Nichols is here for a follow up of his Grade 3A Follicular Non-hodgkin's Lymphoma. He is accompanied by his sister today. He notes that he is doing well overall.  Started Rituxan today. No issues thus far. Labs today, 09/02/17 with LDH WNL. CBC with hgb at 12, HCT at 37.4, MCV at 98.4. BP running a little high since he did not take his AM medications.  On review of systems, pt denies any other symptoms.    MEDICAL HISTORY:  Past Medical History:  Diagnosis Date  . Coronary artery calcification 08/03/2017  . Essential hypertension 08/03/2017  . GERD (gastroesophageal reflux disease)   . HOH (hard of hearing)   . Hyperlipidemia 08/03/2017  . Hypertension   . LBBB (left bundle branch block) 08/03/2017  . Non-Hodgkin lymphoma ([place]) 08/03/2017  . Pneumonia    hx  . Small bowel mass   . Varicose vein of leg 08/03/2017    SURGICAL HISTORY: Past Surgical History:  Procedure Laterality Date  . EYE SURGERY Bilateral 2016   cataracts  . HERNIA REPAIR Bilateral 2008   inguinal  . LAPAROSCOPIC SMALL BOWEL RESECTION  07/06/2017  . LAPAROSCOPIC SMALL BOWEL RESECTION N/A 07/06/2017   Procedure: LAPAROSCOPIC ASSISTED SMALL BOWEL RESECTION;  Surgeon: Coralie Keens, MD;  Location: [facility];  Service: General;  Laterality: N/A;    SOCIAL HISTORY: Social History   Socioeconomic History  . Marital status: Widowed    Spouse name: Not on file  . Number of children: Not on file  . Years of education: Not on file  .  Highest education level: Not on file  Social Needs  . Financial resource strain: Not on file  . Food insecurity - worry: Not on file  . Food insecurity - inability: Not on file  . Transportation needs -  medical: Not on file  . Transportation needs - non-medical: Not on file  Occupational History  . Not on file  Tobacco Use  . Smoking status: Former Smoker    Types: Cigars    Last attempt to quit: 06/29/1984    Years since quitting: 33.2  . Smokeless tobacco: Never Used  Substance and Sexual Activity  . Alcohol use: No    Frequency: Never  . Drug use: No  . Sexual activity: Not on file  Other Topics Concern  . Not on file  Social History Narrative  . Not on file    FAMILY HISTORY: Family History  Problem Relation Age of Onset  . Alzheimer's disease Mother   . Heart disease Mother   . Heart attack Father     ALLERGIES:  has No Known Allergies.  MEDICATIONS:  Current Outpatient Medications  Medication Sig Dispense Refill  . acetaminophen (TYLENOL) 325 MG tablet Take 2 tablets (650 mg total) by mouth every 6 (six) hours as needed for mild pain or moderate pain.    Marland Kitchen albuterol (PROVENTIL HFA;VENTOLIN HFA) 108 (90 Base) MCG/ACT inhaler Inhale 1 puff every 6 (six) hours as needed into the lungs for wheezing or shortness of breath.    Marland Kitchen aspirin EC 81 MG tablet Take 81 mg daily by mouth.    Marland Kitchen atenolol-chlorthalidone (TENORETIC) 100-25 MG tablet Take 0.5 tablets daily by mouth.  1  . Multiple Vitamins-Minerals (PRESERVISION AREDS 2 PO) Take 1 capsule 2 (two) times daily by mouth.    . pantoprazole (PROTONIX) 40 MG tablet Take 40 mg daily by mouth.  3  . potassium chloride SA (K-DUR,KLOR-CON) 20 MEQ tablet Take 40 mEq daily by mouth.    . simvastatin (ZOCOR) 40 MG tablet Take 1 tablet (40 mg total) by mouth every evening. 30 tablet 5  . triamcinolone cream (KENALOG) 0.1 % Apply 1 application daily as needed topically (for dry skin).     No current facility-administered medications for this visit.     REVIEW OF SYSTEMS:    10 Point review of Systems was done is negative except as noted above.  PHYSICAL EXAMINATION:  ECOG PERFORMANCE STATUS: 1 - Symptomatic but completely  ambulatory  .VS reviewed in [place], in no acute distress and comfortable SKIN: no acute rashes, no significant lesions EYES: conjunctiva are pink and non-injected, sclera anicteric OROPHARYNX: MMM, no exudates, no oropharyngeal erythema or ulceration NECK: supple, no JVD LYMPH:  no palpable lymphadenopathy in the cervical, axillary or inguinal regions LUNGS: clear to auscultation b/l with normal respiratory effort HEART: regular rate & rhythm ABDOMEN:  normoactive bowel sounds , non tender, not distended. Healed surgical incisions Extremity: Mild bilateral distal lower extremity edema PSYCH: alert & oriented x 3 with fluent speech NEURO: no focal motor/sensory deficits  LABORATORY DATA:  I have reviewed the data as listed . CBC Latest Ref Rng & Units 09/02/2017 08/24/2017 07/21/2017  WBC 4.0 - 10.3 K/uL 5.0 5.0 5.1  Hemoglobin 13.0 - 17.1 g/dL - 11.6(L) 10.8(L)  Hematocrit 38.4 - 49.9 % 37.4(L) 35.8(L) 34.1(L)  Platelets 140 - 400 K/uL 202 188 277   CMP Latest Ref Rng & Units 09/02/2017 08/24/2017 07/21/2017  Glucose 70 - 140 mg/dL 143(H) 93 85  BUN 7 -  26 mg/dL 18 15 14.8  Creatinine 0.70 - 1.30 mg/dL - 1.18 1.4(H)  Sodium 136 - 145 mmol/L 141 142 142  Potassium 3.5 - 5.1 mmol/L 3.6 4.0 4.5  Chloride 98 - 109 mmol/L 102 104 -  CO2 22 - 29 mmol/L 31(H) 30(H) 31(H)  Calcium 8.4 - 10.4 mg/dL 9.6 9.5 9.1  Total Protein 6.4 - 8.3 g/dL 7.3 7.1 6.8  Total Bilirubin 0.2 - 1.2 mg/dL 1.4(H) 1.6(H) 1.09  Alkaline Phos 40 - 150 U/L 66 63 65  AST 5 - 34 U/L 23 20 15   ALT 0 - 55 U/L 15 14 9    . Lab Results  Component Value Date   LDH 168 09/02/2017        CT CHEST ABD 06/14/2017 IMPRESSION: 1. Bilateral pulmonary parenchymal opacities, peribronchovascular in distribution. Favor infection or (especially given the clinical history of vomiting) aspiration. Neoplastic processes are felt unlikely but cannot be entirely excluded. Especially if the patient is diagnosed with  lymphoma, consider CT followup after therapy to confirm resolution. 2. No thoracic adenopathy. 3. Mild cardiomegaly. Coronary artery atherosclerosis. Aortic Atherosclerosis (ICD10-I70.0). 4. Development of small bowel dilatation, suspicious for obstruction at the site of distal small bowel thickening on prior exam. 5. Borderline ascending aortic aneurysm. Recommend annual imaging followup by CTA or MRA. This recommendation follows 2010 ACCF/AHA/AATS/ACR/ASA/SCA/SCAI/SIR/STS/SVM Guidelines for the Diagnosis and Management of Patients with Thoracic Aortic Disease. Circulation. 2010; 121: N397-Q734. 6. Nonspecific left upper lobe pulmonary nodule.  RADIOGRAPHIC STUDIES: I have personally reviewed the radiological images as listed and agreed with the findings in the report. Nm Pet Image Initial (pi) Skull Base To Thigh  Result Date: 08/22/2017 CLINICAL DATA:  Initial treatment strategy for follicular lymphoma. History of non-Hodgkin's lymphoma and small bowel mass. EXAM: NUCLEAR MEDICINE PET SKULL BASE TO THIGH TECHNIQUE: 7.2 MCi F-18 FDG was injected intravenously. Full-ring PET imaging was performed from the skull base to thigh after the radiotracer. CT data was obtained and used for attenuation correction and anatomic localization. FASTING BLOOD GLUCOSE:  Value: 95 mg/dl COMPARISON:  Abdominopelvic CT 06/09/2017.  Chest CT of 06/14/2017 FINDINGS: NECK: No areas of abnormal hypermetabolism. No cervical adenopathy. Bilateral carotid atherosclerosis. Mucosal thickening of bilateral ethmoid air cells and left sphenoid sinus. CHEST: No pulmonary parenchymal or thoracic nodal hypermetabolism identified. Mild cardiomegaly with multivessel coronary artery atherosclerosis. No thoracic adenopathy. Clearing of pulmonary opacities since the prior diagnostic CT. ABDOMEN/PELVIS: Hypermetabolic ileocolic mesenteric nodal mass. 3.3 x 2.5 cm and a S.U.V. max of 11.1 on image 142/series 4. Adenopathy in this region  measured maximally 1.4 x 1.8 cm on the prior diagnostic CT (when remeasured). Interval with surgical sutures in the adjacent small bowel. Left renal cyst. Bilateral renal scarring. Too small to characterize interpolar left renal lesion. Cholelithiasis. Abdominal aortic atherosclerosis. Underdistended right pelvic small bowel, including on image 170/series 4. Moderate prostatomegaly. SKELETON: No abnormal marrow activity. Bilateral hip osteoarthritis. Degenerative partial fusion of the bilateral sacroiliac joints. IMPRESSION: 1. Isolated hypermetabolic pericolic mesenteric nodal mass,consistent with active lymphoma. This is progressive since 06/09/2017. 2. No other evidence of hypermetabolic active lymphoma. 3. Coronary artery atherosclerosis. Aortic Atherosclerosis (ICD10-I70.0). 4. Cholelithiasis. Electronically Signed   By: Abigail Miyamoto M.D.   On: 08/22/2017 14:25    ASSESSMENT & PLAN:   Henry Nichols is a wonderful 84 y.o. 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 No type B symptoms  PLAN -Discussed with patient and his sister  regarding his labwork today . --Continue with Rituxan treatments for a total of 4 treatments once weekly followed by maintenance Rituxan q 2 months x 1 year.  -tolerated 1st dose of Rituxan well without any immediate concerns.  #2 HTN - elevated BP Plan -Advised the patient to continue taking HTN medications as prescribed on the day of infusion.    -Continue Rituxan qweekly as ordered -RTC with Dr Irene Limbo on 09/16/2017 with labs with 3rd dose of Rituxan  All of the patients questions were answered with apparent satisfaction. The patient knows to call the clinic with any problems, questions or concerns.  I spent 15 minutes counseling the patient face to face. The total time spent in the appointment was 20 minutes and more than 50% was on counseling and direct patient cares.    Sullivan Lone  MD [place] AAHIVMS [facility] Hematology/Oncology Physician [facility]  (Office):       [phone] (Work cell):  [phone] (Fax):           [phone]  09/02/2017 12:02 PM  This document serves as a record of services personally performed by Sullivan Lone, MD. It was created on his behalf by Steva Colder, a trained medical scribe. The creation of this record is based on the scribe's personal observations and the provider's statements to them.   .I have reviewed the above documentation for accuracy and completeness, and I agree with the above. Brunetta Genera MD MS

## 2017-09-02 ENCOUNTER — Inpatient Hospital Stay (HOSPITAL_BASED_OUTPATIENT_CLINIC_OR_DEPARTMENT_OTHER): Payer: Medicare Other | Admitting: Hematology

## 2017-09-02 ENCOUNTER — Inpatient Hospital Stay: Payer: Medicare Other

## 2017-09-02 ENCOUNTER — Other Ambulatory Visit: Payer: Self-pay

## 2017-09-02 VITALS — BP 127/59 | HR 54 | Temp 97.9°F | Resp 18

## 2017-09-02 DIAGNOSIS — C8293 Follicular lymphoma, unspecified, intra-abdominal lymph nodes: Secondary | ICD-10-CM

## 2017-09-02 DIAGNOSIS — C8233 Follicular lymphoma grade IIIa, intra-abdominal lymph nodes: Secondary | ICD-10-CM

## 2017-09-02 DIAGNOSIS — I10 Essential (primary) hypertension: Secondary | ICD-10-CM | POA: Diagnosis not present

## 2017-09-02 DIAGNOSIS — Z7189 Other specified counseling: Secondary | ICD-10-CM

## 2017-09-02 DIAGNOSIS — Z5111 Encounter for antineoplastic chemotherapy: Secondary | ICD-10-CM | POA: Diagnosis not present

## 2017-09-02 DIAGNOSIS — C8290 Follicular lymphoma, unspecified, unspecified site: Secondary | ICD-10-CM | POA: Diagnosis not present

## 2017-09-02 DIAGNOSIS — Z79899 Other long term (current) drug therapy: Secondary | ICD-10-CM | POA: Diagnosis not present

## 2017-09-02 DIAGNOSIS — K219 Gastro-esophageal reflux disease without esophagitis: Secondary | ICD-10-CM | POA: Diagnosis not present

## 2017-09-02 DIAGNOSIS — N400 Enlarged prostate without lower urinary tract symptoms: Secondary | ICD-10-CM | POA: Diagnosis not present

## 2017-09-02 DIAGNOSIS — E785 Hyperlipidemia, unspecified: Secondary | ICD-10-CM | POA: Diagnosis not present

## 2017-09-02 LAB — CBC WITH DIFFERENTIAL (CANCER CENTER ONLY)
Basophils Absolute: 0.1 10*3/uL (ref 0.0–0.1)
Basophils Relative: 1 %
EOS ABS: 0.7 10*3/uL — AB (ref 0.0–0.5)
Eosinophils Relative: 15 %
HEMATOCRIT: 37.4 % — AB (ref 38.4–49.9)
HEMOGLOBIN: 12 g/dL — AB (ref 13.0–17.1)
LYMPHS ABS: 1.4 10*3/uL (ref 0.9–3.3)
LYMPHS PCT: 28 %
MCH: 31.6 pg (ref 27.2–33.4)
MCHC: 32.1 g/dL (ref 32.0–36.0)
MCV: 98.4 fL — AB (ref 79.3–98.0)
Monocytes Absolute: 0.6 10*3/uL (ref 0.1–0.9)
Monocytes Relative: 11 %
NEUTROS ABS: 2.3 10*3/uL (ref 1.5–6.5)
NEUTROS PCT: 45 %
Platelet Count: 202 10*3/uL (ref 140–400)
RBC: 3.8 MIL/uL — AB (ref 4.20–5.82)
RDW: 14 % (ref 11.0–15.6)
WBC: 5 10*3/uL (ref 4.0–10.3)

## 2017-09-02 LAB — CMP (CANCER CENTER ONLY)
ALK PHOS: 66 U/L (ref 40–150)
ALT: 15 U/L (ref 0–55)
AST: 23 U/L (ref 5–34)
Albumin: 3.8 g/dL (ref 3.5–5.0)
Anion gap: 8 (ref 3–11)
BUN: 18 mg/dL (ref 7–26)
CALCIUM: 9.6 mg/dL (ref 8.4–10.4)
CO2: 31 mmol/L — AB (ref 22–29)
CREATININE: 1.15 mg/dL (ref 0.70–1.30)
Chloride: 102 mmol/L (ref 98–109)
GFR, Estimated: 57 mL/min — ABNORMAL LOW (ref >60)
GLUCOSE: 143 mg/dL — AB (ref 70–140)
Potassium: 3.6 mmol/L (ref 3.5–5.1)
SODIUM: 141 mmol/L (ref 136–145)
Total Bilirubin: 1.4 mg/dL — ABNORMAL HIGH (ref 0.2–1.2)
Total Protein: 7.3 g/dL (ref 6.4–8.3)

## 2017-09-02 LAB — LACTATE DEHYDROGENASE: LDH: 168 U/L (ref 125–245)

## 2017-09-02 MED ORDER — DIPHENHYDRAMINE HCL 25 MG PO CAPS
50.0000 mg | ORAL_CAPSULE | Freq: Once | ORAL | Status: AC
  Administered 2017-09-02: 50 mg via ORAL

## 2017-09-02 MED ORDER — SODIUM CHLORIDE 0.9 % IV SOLN
375.0000 mg/m2 | Freq: Once | INTRAVENOUS | Status: AC
  Administered 2017-09-02: 700 mg via INTRAVENOUS
  Filled 2017-09-02: qty 20

## 2017-09-02 MED ORDER — METHYLPREDNISOLONE SODIUM SUCC 125 MG IJ SOLR
80.0000 mg | Freq: Every day | INTRAMUSCULAR | Status: DC
  Administered 2017-09-02: 80 mg via INTRAVENOUS

## 2017-09-02 MED ORDER — AMLODIPINE BESYLATE 5 MG PO TABS
10.0000 mg | ORAL_TABLET | Freq: Once | ORAL | Status: AC
  Administered 2017-09-02: 10 mg via ORAL
  Filled 2017-09-02: qty 2

## 2017-09-02 MED ORDER — ACETAMINOPHEN 325 MG PO TABS
650.0000 mg | ORAL_TABLET | Freq: Once | ORAL | Status: AC
  Administered 2017-09-02: 650 mg via ORAL

## 2017-09-02 MED ORDER — SODIUM CHLORIDE 0.9 % IV SOLN
Freq: Once | INTRAVENOUS | Status: AC
  Administered 2017-09-02: 10:00:00 via INTRAVENOUS

## 2017-09-02 NOTE — Progress Notes (Signed)
29- Per Dr. Irene Limbo ok to proceed with elevated BP and we will monitor throughout infusion of Rituxan.  1122- BP 176/74. Dr. Irene Limbo gave order for amlodopine 10 mg. Will continue infusion and monitor.

## 2017-09-02 NOTE — Patient Instructions (Signed)
[facility] Discharge Instructions for Patients Receiving Chemotherapy  Today you received the following immunotherapy: Rituxan.  To help prevent nausea and vomiting after your treatment, we encourage you to take your nausea medication as prescribed.  If you develop nausea and vomiting that is not controlled by your nausea medication, call the clinic.   BELOW ARE SYMPTOMS THAT SHOULD BE REPORTED IMMEDIATELY:  *FEVER GREATER THAN 100.5 F  *CHILLS WITH OR WITHOUT FEVER  NAUSEA AND VOMITING THAT IS NOT CONTROLLED WITH YOUR NAUSEA MEDICATION  *UNUSUAL SHORTNESS OF BREATH  *UNUSUAL BRUISING OR BLEEDING  TENDERNESS IN MOUTH AND THROAT WITH OR WITHOUT PRESENCE OF ULCERS  *URINARY PROBLEMS  *BOWEL PROBLEMS  UNUSUAL RASH Items with * indicate a potential emergency and should be followed up as soon as possible.  Feel free to call the clinic should you have any questions or concerns. The clinic phone number is [phone].  Please show the [place] at check-in to the Emergency Department and triage nurse.  Rituximab injection What is this medicine? RITUXIMAB (ri TUX i mab) is a monoclonal antibody. It is used to treat certain types of cancer like non-Hodgkin lymphoma and chronic lymphocytic leukemia. It is also used to treat rheumatoid arthritis, granulomatosis with polyangiitis (or Wegener's granulomatosis), and microscopic polyangiitis. This medicine may be used for other purposes; ask your health care provider or pharmacist if you have questions. COMMON BRAND NAME(S): Rituxan What should I tell my health care provider before I take this medicine? They need to know if you have any of these conditions: -heart disease -infection (especially a virus infection such as hepatitis B, chickenpox, cold sores, or herpes) -immune system problems -irregular heartbeat -kidney disease -lung or breathing disease, like asthma -recently received or scheduled to receive a  vaccine -an unusual or allergic reaction to rituximab, mouse proteins, other medicines, foods, dyes, or preservatives -pregnant or trying to get pregnant -breast-feeding How should I use this medicine? This medicine is for infusion into a vein. It is administered in a hospital or clinic by a specially trained health care professional. A special MedGuide will be given to you by the pharmacist with each prescription and refill. Be sure to read this information carefully each time. Talk to your pediatrician regarding the use of this medicine in children. This medicine is not approved for use in children. Overdosage: If you think you have taken too much of this medicine contact a poison control center or emergency room at once. NOTE: This medicine is only for you. Do not share this medicine with others. What if I miss a dose? It is important not to miss a dose. Call your doctor or health care professional if you are unable to keep an appointment. What may interact with this medicine? -cisplatin -other medicines for arthritis like disease modifying antirheumatic drugs or tumor necrosis factor inhibitors -live virus vaccines This list may not describe all possible interactions. Give your health care provider a list of all the medicines, herbs, non-prescription drugs, or dietary supplements you use. Also tell them if you smoke, drink alcohol, or use illegal drugs. Some items may interact with your medicine. What should I watch for while using this medicine? Your condition will be monitored carefully while you are receiving this medicine. You may need blood work done while you are taking this medicine. This medicine can cause serious allergic reactions. To reduce your risk you may need to take medicine before treatment with this medicine. Take your medicine as directed.  In some patients, this medicine may cause a serious brain infection that may cause death. If you have any problems seeing, thinking,  speaking, walking, or standing, tell your doctor right away. If you cannot reach your doctor, urgently seek other source of medical care. Call your doctor or health care professional for advice if you get a fever, chills or sore throat, or other symptoms of a cold or flu. Do not treat yourself. This drug decreases your body's ability to fight infections. Try to avoid being around people who are sick. Do not become pregnant while taking this medicine or for 12 months after stopping it. Women should inform their doctor if they wish to become pregnant or think they might be pregnant. There is a potential for serious side effects to an unborn child. Talk to your health care professional or pharmacist for more information. What side effects may I notice from receiving this medicine? Side effects that you should report to your doctor or health care professional as soon as possible: -breathing problems -chest pain -dizziness or feeling faint -fast, irregular heartbeat -low blood counts - this medicine may decrease the number of white blood cells, red blood cells and platelets. You may be at increased risk for infections and bleeding. -mouth sores -redness, blistering, peeling or loosening of the skin, including inside the mouth (this can be added for any serious or exfoliative rash that could lead to hospitalization) -signs of infection - fever or chills, cough, sore throat, pain or difficulty passing urine -signs and symptoms of kidney injury like trouble passing urine or change in the amount of urine -signs and symptoms of liver injury like dark yellow or brown urine; general ill feeling or flu-like symptoms; light-colored stools; loss of appetite; nausea; right upper belly pain; unusually weak or tired; yellowing of the eyes or skin -stomach pain -vomiting Side effects that usually do not require medical attention (report to your doctor or health care professional if they continue or are  bothersome): -headache -joint pain -muscle cramps or muscle pain This list may not describe all possible side effects. Call your doctor for medical advice about side effects. You may report side effects to FDA at 1-800-FDA-1088. Where should I keep my medicine? This drug is given in a hospital or clinic and will not be stored at home. NOTE: This sheet is a summary. It may not cover all possible information. If you have questions about this medicine, talk to your doctor, pharmacist, or health care provider.  2018 Elsevier/Gold Standard (2016-03-10 15:28:09)

## 2017-09-08 ENCOUNTER — Other Ambulatory Visit: Payer: Self-pay | Admitting: *Deleted

## 2017-09-08 DIAGNOSIS — C8293 Follicular lymphoma, unspecified, intra-abdominal lymph nodes: Secondary | ICD-10-CM

## 2017-09-09 ENCOUNTER — Inpatient Hospital Stay: Payer: Medicare Other

## 2017-09-09 ENCOUNTER — Telehealth: Payer: Self-pay | Admitting: *Deleted

## 2017-09-09 VITALS — BP 114/58 | HR 51 | Temp 97.6°F | Resp 16

## 2017-09-09 DIAGNOSIS — Z5111 Encounter for antineoplastic chemotherapy: Secondary | ICD-10-CM | POA: Diagnosis not present

## 2017-09-09 DIAGNOSIS — C8293 Follicular lymphoma, unspecified, intra-abdominal lymph nodes: Secondary | ICD-10-CM

## 2017-09-09 DIAGNOSIS — N400 Enlarged prostate without lower urinary tract symptoms: Secondary | ICD-10-CM | POA: Diagnosis not present

## 2017-09-09 DIAGNOSIS — C8290 Follicular lymphoma, unspecified, unspecified site: Secondary | ICD-10-CM | POA: Diagnosis not present

## 2017-09-09 DIAGNOSIS — E785 Hyperlipidemia, unspecified: Secondary | ICD-10-CM | POA: Diagnosis not present

## 2017-09-09 DIAGNOSIS — C8233 Follicular lymphoma grade IIIa, intra-abdominal lymph nodes: Secondary | ICD-10-CM

## 2017-09-09 DIAGNOSIS — Z7189 Other specified counseling: Secondary | ICD-10-CM

## 2017-09-09 DIAGNOSIS — K219 Gastro-esophageal reflux disease without esophagitis: Secondary | ICD-10-CM | POA: Diagnosis not present

## 2017-09-09 DIAGNOSIS — I10 Essential (primary) hypertension: Secondary | ICD-10-CM | POA: Diagnosis not present

## 2017-09-09 LAB — CMP (CANCER CENTER ONLY)
ALBUMIN: 3.5 g/dL (ref 3.5–5.0)
ALK PHOS: 64 U/L (ref 40–150)
ALT: 10 U/L (ref 0–55)
AST: 17 U/L (ref 5–34)
Anion gap: 7 (ref 3–11)
BILIRUBIN TOTAL: 1.2 mg/dL (ref 0.2–1.2)
BUN: 17 mg/dL (ref 7–26)
CALCIUM: 9.1 mg/dL (ref 8.4–10.4)
CO2: 30 mmol/L — ABNORMAL HIGH (ref 22–29)
Chloride: 103 mmol/L (ref 98–109)
Creatinine: 1.1 mg/dL (ref 0.70–1.30)
GFR, Est AFR Am: >60 mL/min (ref >60)
GFR, Estimated: >60 mL/min (ref >60)
GLUCOSE: 95 mg/dL (ref 70–140)
POTASSIUM: 3.8 mmol/L (ref 3.5–5.1)
Sodium: 140 mmol/L (ref 136–145)
TOTAL PROTEIN: 6.7 g/dL (ref 6.4–8.3)

## 2017-09-09 LAB — CBC WITH DIFFERENTIAL (CANCER CENTER ONLY)
BASOS ABS: 0.1 10*3/uL (ref 0.0–0.1)
BASOS PCT: 1 %
Eosinophils Absolute: 0.6 10*3/uL — ABNORMAL HIGH (ref 0.0–0.5)
Eosinophils Relative: 13 %
HEMATOCRIT: 35.9 % — AB (ref 38.4–49.9)
HEMOGLOBIN: 11.7 g/dL — AB (ref 13.0–17.1)
Lymphocytes Relative: 24 %
Lymphs Abs: 1.2 10*3/uL (ref 0.9–3.3)
MCH: 31.9 pg (ref 27.2–33.4)
MCHC: 32.6 g/dL (ref 32.0–36.0)
MCV: 97.8 fL (ref 79.3–98.0)
Monocytes Absolute: 0.5 10*3/uL (ref 0.1–0.9)
Monocytes Relative: 11 %
NEUTROS ABS: 2.5 10*3/uL (ref 1.5–6.5)
NEUTROS PCT: 51 %
Platelet Count: 226 10*3/uL (ref 140–400)
RBC: 3.67 MIL/uL — ABNORMAL LOW (ref 4.20–5.82)
RDW: 13.6 % (ref 11.0–15.6)
WBC Count: 4.9 10*3/uL (ref 4.0–10.3)

## 2017-09-09 MED ORDER — SODIUM CHLORIDE 0.9 % IV SOLN
375.0000 mg/m2 | Freq: Once | INTRAVENOUS | Status: AC
  Administered 2017-09-09: 700 mg via INTRAVENOUS
  Filled 2017-09-09: qty 20

## 2017-09-09 MED ORDER — SODIUM CHLORIDE 0.9 % IV SOLN
Freq: Once | INTRAVENOUS | Status: AC
  Administered 2017-09-09: 14:00:00 via INTRAVENOUS

## 2017-09-09 MED ORDER — ACETAMINOPHEN 325 MG PO TABS
650.0000 mg | ORAL_TABLET | Freq: Once | ORAL | Status: AC
  Administered 2017-09-09: 650 mg via ORAL

## 2017-09-09 MED ORDER — DIPHENHYDRAMINE HCL 25 MG PO CAPS
50.0000 mg | ORAL_CAPSULE | Freq: Once | ORAL | Status: AC
  Administered 2017-09-09: 50 mg via ORAL

## 2017-09-09 MED ORDER — METHYLPREDNISOLONE SODIUM SUCC 125 MG IJ SOLR
80.0000 mg | Freq: Every day | INTRAMUSCULAR | Status: DC
  Administered 2017-09-09: 80 mg via INTRAVENOUS

## 2017-09-09 NOTE — Telephone Encounter (Signed)
"  I'm scheduled for lab and infusion today at 1230.  Do I have to fast like the last time?  I was told to fast to ensure no reaction with Rituxan.  No reaction at that time."   No fasting needed today.   "I'll eat something lite." Denies further questions or needs at this time.

## 2017-09-09 NOTE — Patient Instructions (Signed)
[facility] Discharge Instructions for Patients Receiving Chemotherapy  Today you received the following immunotherapy: Rituxan.  To help prevent nausea and vomiting after your treatment, we encourage you to take your nausea medication as prescribed.  If you develop nausea and vomiting that is not controlled by your nausea medication, call the clinic.   BELOW ARE SYMPTOMS THAT SHOULD BE REPORTED IMMEDIATELY:  *FEVER GREATER THAN 100.5 F  *CHILLS WITH OR WITHOUT FEVER  NAUSEA AND VOMITING THAT IS NOT CONTROLLED WITH YOUR NAUSEA MEDICATION  *UNUSUAL SHORTNESS OF BREATH  *UNUSUAL BRUISING OR BLEEDING  TENDERNESS IN MOUTH AND THROAT WITH OR WITHOUT PRESENCE OF ULCERS  *URINARY PROBLEMS  *BOWEL PROBLEMS  UNUSUAL RASH Items with * indicate a potential emergency and should be followed up as soon as possible.  Feel free to call the clinic should you have any questions or concerns. The clinic phone number is [phone].  Please show the [place] at check-in to the Emergency Department and triage nurse.  Rituximab injection What is this medicine? RITUXIMAB (ri TUX i mab) is a monoclonal antibody. It is used to treat certain types of cancer like non-Hodgkin lymphoma and chronic lymphocytic leukemia. It is also used to treat rheumatoid arthritis, granulomatosis with polyangiitis (or Wegener's granulomatosis), and microscopic polyangiitis. This medicine may be used for other purposes; ask your health care provider or pharmacist if you have questions. COMMON BRAND NAME(S): Rituxan What should I tell my health care provider before I take this medicine? They need to know if you have any of these conditions: -heart disease -infection (especially a virus infection such as hepatitis B, chickenpox, cold sores, or herpes) -immune system problems -irregular heartbeat -kidney disease -lung or breathing disease, like asthma -recently received or scheduled to receive a  vaccine -an unusual or allergic reaction to rituximab, mouse proteins, other medicines, foods, dyes, or preservatives -pregnant or trying to get pregnant -breast-feeding How should I use this medicine? This medicine is for infusion into a vein. It is administered in a hospital or clinic by a specially trained health care professional. A special MedGuide will be given to you by the pharmacist with each prescription and refill. Be sure to read this information carefully each time. Talk to your pediatrician regarding the use of this medicine in children. This medicine is not approved for use in children. Overdosage: If you think you have taken too much of this medicine contact a poison control center or emergency room at once. NOTE: This medicine is only for you. Do not share this medicine with others. What if I miss a dose? It is important not to miss a dose. Call your doctor or health care professional if you are unable to keep an appointment. What may interact with this medicine? -cisplatin -other medicines for arthritis like disease modifying antirheumatic drugs or tumor necrosis factor inhibitors -live virus vaccines This list may not describe all possible interactions. Give your health care provider a list of all the medicines, herbs, non-prescription drugs, or dietary supplements you use. Also tell them if you smoke, drink alcohol, or use illegal drugs. Some items may interact with your medicine. What should I watch for while using this medicine? Your condition will be monitored carefully while you are receiving this medicine. You may need blood work done while you are taking this medicine. This medicine can cause serious allergic reactions. To reduce your risk you may need to take medicine before treatment with this medicine. Take your medicine as directed.  In some patients, this medicine may cause a serious brain infection that may cause death. If you have any problems seeing, thinking,  speaking, walking, or standing, tell your doctor right away. If you cannot reach your doctor, urgently seek other source of medical care. Call your doctor or health care professional for advice if you get a fever, chills or sore throat, or other symptoms of a cold or flu. Do not treat yourself. This drug decreases your body's ability to fight infections. Try to avoid being around people who are sick. Do not become pregnant while taking this medicine or for 12 months after stopping it. Women should inform their doctor if they wish to become pregnant or think they might be pregnant. There is a potential for serious side effects to an unborn child. Talk to your health care professional or pharmacist for more information. What side effects may I notice from receiving this medicine? Side effects that you should report to your doctor or health care professional as soon as possible: -breathing problems -chest pain -dizziness or feeling faint -fast, irregular heartbeat -low blood counts - this medicine may decrease the number of white blood cells, red blood cells and platelets. You may be at increased risk for infections and bleeding. -mouth sores -redness, blistering, peeling or loosening of the skin, including inside the mouth (this can be added for any serious or exfoliative rash that could lead to hospitalization) -signs of infection - fever or chills, cough, sore throat, pain or difficulty passing urine -signs and symptoms of kidney injury like trouble passing urine or change in the amount of urine -signs and symptoms of liver injury like dark yellow or brown urine; general ill feeling or flu-like symptoms; light-colored stools; loss of appetite; nausea; right upper belly pain; unusually weak or tired; yellowing of the eyes or skin -stomach pain -vomiting Side effects that usually do not require medical attention (report to your doctor or health care professional if they continue or are  bothersome): -headache -joint pain -muscle cramps or muscle pain This list may not describe all possible side effects. Call your doctor for medical advice about side effects. You may report side effects to FDA at 1-800-FDA-1088. Where should I keep my medicine? This drug is given in a hospital or clinic and will not be stored at home. NOTE: This sheet is a summary. It may not cover all possible information. If you have questions about this medicine, talk to your doctor, pharmacist, or health care provider.  2018 Elsevier/Gold Standard (2016-03-10 15:28:09)

## 2017-09-15 ENCOUNTER — Other Ambulatory Visit: Payer: Self-pay

## 2017-09-15 DIAGNOSIS — C8233 Follicular lymphoma grade IIIa, intra-abdominal lymph nodes: Secondary | ICD-10-CM

## 2017-09-15 NOTE — Progress Notes (Signed)
HEMATOLOGY/ONCOLOGY CONSULTATION NOTE  Date of Service: 09/16/2017  Patient Care Team: [name], MD as PCP - General (Family Medicine)  CHIEF COMPLAINTS/PURPOSE OF CONSULTATION:  Newly diagnosed small bowel follicular lymphoma  HISTORY OF PRESENTING ILLNESS:   Henry Nichols is a wonderful 84 y.o. male who has been referred to [place] by Dr [name], MD for evaluation and management of a small bowel follicular lymphoma.   He presented to ED for abdominal pain and had a CT chest/abd/pelvis on 06/09/2017 with results showing: IMPRESSION: 1. Bilateral pulmonary parenchymal opacities, peribronchovascular in distribution. Favor infection or (especially given the clinical history of vomiting) aspiration. Neoplastic processes are felt unlikely but cannot be entirely excluded. Especially if the patient is diagnosed with lymphoma, consider CT followup after therapy to confirm resolution. 2. No thoracic adenopathy. 3. Mild cardiomegaly. Coronary artery atherosclerosis. Aortic Atherosclerosis (ICD10-I70.0). 4. Development of small bowel dilatation, suspicious for obstruction at the site of distal small bowel thickening on prior exam. 5. Borderline ascending aortic aneurysm. Recommend annual imaging followup by CTA or MRA. This recommendation follows 2010 ACCF/AHA/AATS/ACR/ASA/SCA/SCAI/SIR/STS/SVM Guidelines for the Diagnosis and Management of Patients with Thoracic Aortic Disease. Circulation. 2010; 121: L798-X211. 6. Nonspecific left upper lobe pulmonary nodule.   He is accompanied by his son today. He states he is doing well overall. He reports noticing a difficulty swallowing and nausea/vomiting after eating. He saw his PCP for this and had a CT scan. He was then referred to Dr. [name] and underwent a laparoscopic small bowel resection with resoltion of symptoms.  He states he wasn't losing much weight before these symptoms began and lost 15 pounds after.   Pathology showed follicular  lymphoma   On review of systems, pt reports vomiting, constipation, weight loss, swelling and skin rash to the right ankle, and denies SOB, cough, bloody stools, diarrhea, fever, chills, night sweats and any other accompanying symptoms.   INTERVAL HISTORY   Henry Nichols is here for a follow up of his Grade 3A Follicular Non-hodgkin's Lymphoma prior to his 3rd dose of RItuxan. The patient's last visit with [place] was on 09/02/17. He is accompanied today by his sister. The pt reports that he is doing well overall and that he is doing very well with his treatment so far with no reported allergic reaction or prohibitive toxicities.  Lab results today (09/16/17) of CBC, CMP, and Reticulocytes is as follows: all values are WNL except for RBC at 3.88, Hgb at 12.3, HCT at 37.9, CO2 at 30. His LDH at 169.   On review of systems, pt reports frequent urination, normal bowel movements, and denies SOB, rashes, abdominal discomfort, and any other symptoms.    MEDICAL HISTORY:  Past Medical History:  Diagnosis Date  . Coronary artery calcification 08/03/2017  . Essential hypertension 08/03/2017  . GERD (gastroesophageal reflux disease)   . HOH (hard of hearing)   . Hyperlipidemia 08/03/2017  . Hypertension   . LBBB (left bundle branch block) 08/03/2017  . Non-Hodgkin lymphoma ([place]) 08/03/2017  . Pneumonia    hx  . Small bowel mass   . Varicose vein of leg 08/03/2017    SURGICAL HISTORY: Past Surgical History:  Procedure Laterality Date  . EYE SURGERY Bilateral 2016   cataracts  . HERNIA REPAIR Bilateral 2008   inguinal  . LAPAROSCOPIC SMALL BOWEL RESECTION  07/06/2017  . LAPAROSCOPIC SMALL BOWEL RESECTION N/A 07/06/2017   Procedure: LAPAROSCOPIC ASSISTED SMALL BOWEL RESECTION;  Surgeon: Coralie Keens, MD;  Location: [facility];  Service: General;  Laterality: N/A;    SOCIAL HISTORY: Social History   Socioeconomic History  . Marital status: Widowed    Spouse name: Not on file  . Number  of children: Not on file  . Years of education: Not on file  . Highest education level: Not on file  Social Needs  . Financial resource strain: Not on file  . Food insecurity - worry: Not on file  . Food insecurity - inability: Not on file  . Transportation needs - medical: Not on file  . Transportation needs - non-medical: Not on file  Occupational History  . Not on file  Tobacco Use  . Smoking status: Former Smoker    Types: Cigars    Last attempt to quit: 06/29/1984    Years since quitting: 33.2  . Smokeless tobacco: Never Used  Substance and Sexual Activity  . Alcohol use: No    Frequency: Never  . Drug use: No  . Sexual activity: Not on file  Other Topics Concern  . Not on file  Social History Narrative  . Not on file    FAMILY HISTORY: Family History  Problem Relation Age of Onset  . Alzheimer's disease Mother   . Heart disease Mother   . Heart attack Father     ALLERGIES:  has No Known Allergies.  MEDICATIONS:  Current Outpatient Medications  Medication Sig Dispense Refill  . acetaminophen (TYLENOL) 325 MG tablet Take 2 tablets (650 mg total) by mouth every 6 (six) hours as needed for mild pain or moderate pain.    Marland Kitchen albuterol (PROVENTIL HFA;VENTOLIN HFA) 108 (90 Base) MCG/ACT inhaler Inhale 1 puff every 6 (six) hours as needed into the lungs for wheezing or shortness of breath.    Marland Kitchen aspirin EC 81 MG tablet Take 81 mg daily by mouth.    Marland Kitchen atenolol-chlorthalidone (TENORETIC) 100-25 MG tablet Take 0.5 tablets daily by mouth.  1  . Multiple Vitamins-Minerals (PRESERVISION AREDS 2 PO) Take 1 capsule 2 (two) times daily by mouth.    . pantoprazole (PROTONIX) 40 MG tablet Take 40 mg daily by mouth.  3  . potassium chloride SA (K-DUR,KLOR-CON) 20 MEQ tablet Take 40 mEq daily by mouth.    . simvastatin (ZOCOR) 40 MG tablet Take 1 tablet (40 mg total) by mouth every evening. 30 tablet 5  . triamcinolone cream (KENALOG) 0.1 % Apply 1 application daily as needed topically  (for dry skin).     No current facility-administered medications for this visit.     REVIEW OF SYSTEMS:    10 Point review of Systems was done is negative except as noted above.  PHYSICAL EXAMINATION:  ECOG PERFORMANCE STATUS: 1 - Symptomatic but completely ambulatory  .VS reviewed in [place], in no acute distress and comfortable SKIN: no acute rashes, no significant lesions EYES: conjunctiva are pink and non-injected, sclera anicteric OROPHARYNX: MMM, no exudates, no oropharyngeal erythema or ulceration NECK: supple, no JVD LYMPH:  no palpable lymphadenopathy in the cervical, axillary or inguinal regions LUNGS: clear to auscultation b/l with normal respiratory effort HEART: regular rate & rhythm ABDOMEN:  normoactive bowel sounds , non tender, not distended. Healed surgical incisions Extremity: Mild bilateral distal lower extremity edema PSYCH: alert & oriented x 3 with fluent speech NEURO: no focal motor/sensory deficits  LABORATORY DATA:  I have reviewed the data as listed . CBC Latest Ref Rng & Units 09/16/2017 09/09/2017 09/02/2017  WBC 4.0 - 10.3 K/uL 5.0 4.9 5.0  Hemoglobin 13.0 -  17.1 g/dL - - -  Hematocrit 38.4 - 49.9 % 37.9(L) 35.9(L) 37.4(L)  Platelets 140 - 400 K/uL 228 226 202   CMP Latest Ref Rng & Units 09/16/2017 09/09/2017 09/02/2017  Glucose 70 - 140 mg/dL 70 95 143(H)  BUN 7 - 26 mg/dL 21 17 18   Creatinine 0.70 - 1.30 mg/dL - - -  Sodium 136 - 145 mmol/L 142 140 141  Potassium 3.5 - 5.1 mmol/L 4.0 3.8 3.6  Chloride 98 - 109 mmol/L 104 103 102  CO2 22 - 29 mmol/L 30(H) 30(H) 31(H)  Calcium 8.4 - 10.4 mg/dL 9.0 9.1 9.6  Total Protein 6.4 - 8.3 g/dL 6.5 6.7 7.3  Total Bilirubin 0.2 - 1.2 mg/dL 1.2 1.2 1.4(H)  Alkaline Phos 40 - 150 U/L 67 64 66  AST 5 - 34 U/L 15 17 23   ALT 0 - 55 U/L 10 10 15    . Lab Results  Component Value Date   LDH 169 09/16/2017        CT CHEST ABD 06/14/2017 IMPRESSION: 1. Bilateral pulmonary parenchymal  opacities, peribronchovascular in distribution. Favor infection or (especially given the clinical history of vomiting) aspiration. Neoplastic processes are felt unlikely but cannot be entirely excluded. Especially if the patient is diagnosed with lymphoma, consider CT followup after therapy to confirm resolution. 2. No thoracic adenopathy. 3. Mild cardiomegaly. Coronary artery atherosclerosis. Aortic Atherosclerosis (ICD10-I70.0). 4. Development of small bowel dilatation, suspicious for obstruction at the site of distal small bowel thickening on prior exam. 5. Borderline ascending aortic aneurysm. Recommend annual imaging followup by CTA or MRA. This recommendation follows 2010 ACCF/AHA/AATS/ACR/ASA/SCA/SCAI/SIR/STS/SVM Guidelines for the Diagnosis and Management of Patients with Thoracic Aortic Disease. Circulation. 2010; 121: V400-Q676. 6. Nonspecific left upper lobe pulmonary nodule.   RADIOGRAPHIC STUDIES: I have personally reviewed the radiological images as listed and agreed with the findings in the report. Nm Pet Image Initial (pi) Skull Base To Thigh  Result Date: 08/22/2017 CLINICAL DATA:  Initial treatment strategy for follicular lymphoma. History of non-Hodgkin's lymphoma and small bowel mass. EXAM: NUCLEAR MEDICINE PET SKULL BASE TO THIGH TECHNIQUE: 7.2 MCi F-18 FDG was injected intravenously. Full-ring PET imaging was performed from the skull base to thigh after the radiotracer. CT data was obtained and used for attenuation correction and anatomic localization. FASTING BLOOD GLUCOSE:  Value: 95 mg/dl COMPARISON:  Abdominopelvic CT 06/09/2017.  Chest CT of 06/14/2017 FINDINGS: NECK: No areas of abnormal hypermetabolism. No cervical adenopathy. Bilateral carotid atherosclerosis. Mucosal thickening of bilateral ethmoid air cells and left sphenoid sinus. CHEST: No pulmonary parenchymal or thoracic nodal hypermetabolism identified. Mild cardiomegaly with multivessel coronary artery  atherosclerosis. No thoracic adenopathy. Clearing of pulmonary opacities since the prior diagnostic CT. ABDOMEN/PELVIS: Hypermetabolic ileocolic mesenteric nodal mass. 3.3 x 2.5 cm and a S.U.V. max of 11.1 on image 142/series 4. Adenopathy in this region measured maximally 1.4 x 1.8 cm on the prior diagnostic CT (when remeasured). Interval with surgical sutures in the adjacent small bowel. Left renal cyst. Bilateral renal scarring. Too small to characterize interpolar left renal lesion. Cholelithiasis. Abdominal aortic atherosclerosis. Underdistended right pelvic small bowel, including on image 170/series 4. Moderate prostatomegaly. SKELETON: No abnormal marrow activity. Bilateral hip osteoarthritis. Degenerative partial fusion of the bilateral sacroiliac joints. IMPRESSION: 1. Isolated hypermetabolic pericolic mesenteric nodal mass,consistent with active lymphoma. This is progressive since 06/09/2017. 2. No other evidence of hypermetabolic active lymphoma. 3. Coronary artery atherosclerosis. Aortic Atherosclerosis (ICD10-I70.0). 4. Cholelithiasis. Electronically Signed   By: Abigail Miyamoto M.D.   On:  08/22/2017 14:25    ASSESSMENT & PLAN:   Henry Nichols is a wonderful 84 y.o. 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 No type B symptoms PLAN. --Continue with Rituxan treatments for a total of 4 treatments once weekly followed by maintenance Rituxan q 2 months x 1-2 year.  -tolerated 2 doses of Rituxan well without any immediate concerns. -additional Rituxan orders placed and signed. -Repeat PET scan in 7weeks and blood work.  -Discussed maintainence being pursued if his PET scan in 6-7 weeks looks good.  #2 HTN - elevated BP Plan -Advised the patient to continue taking HTN medications as prescribed on the day of infusion.    Complete 4th cycle of Rituxan in 1 week PET/CT in 7weeks RTC with Dr Irene Limbo in 8 weeks with  labs   All of the patients questions were answered with apparent satisfaction. The patient knows to call the clinic with any problems, questions or concerns.  I spent 15 minutes counseling the patient face to face. The total time spent in the appointment was 20 minutes and more than 50% was on counseling and direct patient cares.    Sullivan Lone MD [place] AAHIVMS [facility] Hematology/Oncology Physician [facility]  (Office):       [phone] (Work cell):  [phone] (Fax):           [phone]  This document serves as a record of services personally performed by Sullivan Lone, MD. It was created on his behalf by Baldwin Jamaica, a trained medical scribe. The creation of this record is based on the scribe's personal observations and the provider's statements to them.   .I have reviewed the above documentation for accuracy and completeness, and I agree with the above. Brunetta Genera MD MS

## 2017-09-16 ENCOUNTER — Inpatient Hospital Stay (HOSPITAL_BASED_OUTPATIENT_CLINIC_OR_DEPARTMENT_OTHER): Payer: Medicare Other | Admitting: Hematology

## 2017-09-16 ENCOUNTER — Inpatient Hospital Stay: Payer: Medicare Other | Attending: Hematology

## 2017-09-16 ENCOUNTER — Encounter: Payer: Self-pay | Admitting: Hematology

## 2017-09-16 ENCOUNTER — Inpatient Hospital Stay: Payer: Medicare Other

## 2017-09-16 ENCOUNTER — Other Ambulatory Visit: Payer: Medicare Other

## 2017-09-16 VITALS — BP 138/70 | HR 50 | Temp 97.6°F | Resp 18 | Ht 70.0 in | Wt 147.6 lb

## 2017-09-16 VITALS — BP 128/55 | HR 52 | Temp 97.7°F | Resp 16

## 2017-09-16 DIAGNOSIS — K219 Gastro-esophageal reflux disease without esophagitis: Secondary | ICD-10-CM | POA: Diagnosis not present

## 2017-09-16 DIAGNOSIS — K8020 Calculus of gallbladder without cholecystitis without obstruction: Secondary | ICD-10-CM | POA: Diagnosis not present

## 2017-09-16 DIAGNOSIS — I712 Thoracic aortic aneurysm, without rupture: Secondary | ICD-10-CM | POA: Diagnosis not present

## 2017-09-16 DIAGNOSIS — I10 Essential (primary) hypertension: Secondary | ICD-10-CM

## 2017-09-16 DIAGNOSIS — I6523 Occlusion and stenosis of bilateral carotid arteries: Secondary | ICD-10-CM | POA: Diagnosis not present

## 2017-09-16 DIAGNOSIS — C8233 Follicular lymphoma grade IIIa, intra-abdominal lymph nodes: Secondary | ICD-10-CM

## 2017-09-16 DIAGNOSIS — I700 Atherosclerosis of aorta: Secondary | ICD-10-CM | POA: Insufficient documentation

## 2017-09-16 DIAGNOSIS — E785 Hyperlipidemia, unspecified: Secondary | ICD-10-CM | POA: Diagnosis not present

## 2017-09-16 DIAGNOSIS — Z7982 Long term (current) use of aspirin: Secondary | ICD-10-CM | POA: Diagnosis not present

## 2017-09-16 DIAGNOSIS — Z79899 Other long term (current) drug therapy: Secondary | ICD-10-CM | POA: Insufficient documentation

## 2017-09-16 DIAGNOSIS — Z87891 Personal history of nicotine dependence: Secondary | ICD-10-CM | POA: Diagnosis not present

## 2017-09-16 DIAGNOSIS — Z7189 Other specified counseling: Secondary | ICD-10-CM

## 2017-09-16 DIAGNOSIS — N400 Enlarged prostate without lower urinary tract symptoms: Secondary | ICD-10-CM | POA: Diagnosis not present

## 2017-09-16 DIAGNOSIS — N281 Cyst of kidney, acquired: Secondary | ICD-10-CM | POA: Insufficient documentation

## 2017-09-16 DIAGNOSIS — Z5111 Encounter for antineoplastic chemotherapy: Secondary | ICD-10-CM | POA: Diagnosis not present

## 2017-09-16 LAB — CBC WITH DIFFERENTIAL (CANCER CENTER ONLY)
BASOS ABS: 0 10*3/uL (ref 0.0–0.1)
BASOS PCT: 1 %
EOS ABS: 0.4 10*3/uL (ref 0.0–0.5)
Eosinophils Relative: 9 %
HCT: 37.9 % — ABNORMAL LOW (ref 38.4–49.9)
HEMOGLOBIN: 12.3 g/dL — AB (ref 13.0–17.1)
LYMPHS ABS: 1.3 10*3/uL (ref 0.9–3.3)
Lymphocytes Relative: 26 %
MCH: 31.7 pg (ref 27.2–33.4)
MCHC: 32.5 g/dL (ref 32.0–36.0)
MCV: 97.7 fL (ref 79.3–98.0)
Monocytes Absolute: 0.5 10*3/uL (ref 0.1–0.9)
Monocytes Relative: 11 %
NEUTROS PCT: 53 %
Neutro Abs: 2.7 10*3/uL (ref 1.5–6.5)
Platelet Count: 228 10*3/uL (ref 140–400)
RBC: 3.88 MIL/uL — AB (ref 4.20–5.82)
RDW: 13.7 % (ref 11.0–14.6)
WBC: 5 10*3/uL (ref 4.0–10.3)

## 2017-09-16 LAB — CMP (CANCER CENTER ONLY)
ALT: 10 U/L (ref 0–55)
AST: 15 U/L (ref 5–34)
Albumin: 3.7 g/dL (ref 3.5–5.0)
Alkaline Phosphatase: 67 U/L (ref 40–150)
Anion gap: 8 (ref 3–11)
BUN: 21 mg/dL (ref 7–26)
CHLORIDE: 104 mmol/L (ref 98–109)
CO2: 30 mmol/L — ABNORMAL HIGH (ref 22–29)
Calcium: 9 mg/dL (ref 8.4–10.4)
Creatinine: 1.19 mg/dL (ref 0.70–1.30)
GFR, EST NON AFRICAN AMERICAN: 55 mL/min — AB (ref >60)
GFR, Est AFR Am: >60 mL/min (ref >60)
Glucose, Bld: 70 mg/dL (ref 70–140)
POTASSIUM: 4 mmol/L (ref 3.5–5.1)
Sodium: 142 mmol/L (ref 136–145)
Total Bilirubin: 1.2 mg/dL (ref 0.2–1.2)
Total Protein: 6.5 g/dL (ref 6.4–8.3)

## 2017-09-16 LAB — RETICULOCYTES
RBC.: 3.88 MIL/uL — AB (ref 4.20–5.82)
RETIC COUNT ABSOLUTE: 50.4 10*3/uL (ref 34.8–93.9)
Retic Ct Pct: 1.3 % (ref 0.8–1.8)

## 2017-09-16 LAB — LACTATE DEHYDROGENASE: LDH: 169 U/L (ref 125–245)

## 2017-09-16 MED ORDER — DIPHENHYDRAMINE HCL 25 MG PO CAPS
50.0000 mg | ORAL_CAPSULE | Freq: Once | ORAL | Status: AC
  Administered 2017-09-16: 50 mg via ORAL

## 2017-09-16 MED ORDER — DIPHENHYDRAMINE HCL 25 MG PO CAPS
ORAL_CAPSULE | ORAL | Status: AC
  Filled 2017-09-16: qty 2

## 2017-09-16 MED ORDER — SODIUM CHLORIDE 0.9 % IV SOLN: 375.0000 mg/m2 | Freq: Once | INTRAVENOUS | Status: DC

## 2017-09-16 MED ORDER — SODIUM CHLORIDE 0.9 % IV SOLN
Freq: Once | INTRAVENOUS | Status: AC
  Administered 2017-09-16: 15:00:00 via INTRAVENOUS

## 2017-09-16 MED ORDER — ACETAMINOPHEN 325 MG PO TABS
ORAL_TABLET | ORAL | Status: AC
  Filled 2017-09-16: qty 2

## 2017-09-16 MED ORDER — ACETAMINOPHEN 325 MG PO TABS
650.0000 mg | ORAL_TABLET | Freq: Once | ORAL | Status: AC
  Administered 2017-09-16: 650 mg via ORAL

## 2017-09-16 MED ORDER — METHYLPREDNISOLONE SODIUM SUCC 125 MG IJ SOLR
80.0000 mg | Freq: Every day | INTRAMUSCULAR | Status: DC
  Administered 2017-09-16: 80 mg via INTRAVENOUS

## 2017-09-16 MED ORDER — METHYLPREDNISOLONE SODIUM SUCC 125 MG IJ SOLR
INTRAMUSCULAR | Status: AC
  Filled 2017-09-16: qty 2

## 2017-09-16 MED ORDER — SODIUM CHLORIDE 0.9 % IV SOLN
375.0000 mg/m2 | Freq: Once | INTRAVENOUS | Status: AC
  Administered 2017-09-16: 700 mg via INTRAVENOUS
  Filled 2017-09-16: qty 50

## 2017-09-16 NOTE — Patient Instructions (Signed)
[facility] Discharge Instructions for Patients Receiving Chemotherapy  Today you received the following chemotherapy agents Rituximab (Rituxan).  To help prevent nausea and vomiting after your treatment, we encourage you to take your nausea medication as prescribed.   If you develop nausea and vomiting that is not controlled by your nausea medication, call the clinic.   BELOW ARE SYMPTOMS THAT SHOULD BE REPORTED IMMEDIATELY:  *FEVER GREATER THAN 100.5 F  *CHILLS WITH OR WITHOUT FEVER  NAUSEA AND VOMITING THAT IS NOT CONTROLLED WITH YOUR NAUSEA MEDICATION  *UNUSUAL SHORTNESS OF BREATH  *UNUSUAL BRUISING OR BLEEDING  TENDERNESS IN MOUTH AND THROAT WITH OR WITHOUT PRESENCE OF ULCERS  *URINARY PROBLEMS  *BOWEL PROBLEMS  UNUSUAL RASH Items with * indicate a potential emergency and should be followed up as soon as possible.  Feel free to call the clinic should you have any questions or concerns. The clinic phone number is [phone].  Please show the CHEMO ALERT CARD at check-in to the Emergency Department and triage nurse.   

## 2017-09-22 ENCOUNTER — Other Ambulatory Visit: Payer: Self-pay | Admitting: *Deleted

## 2017-09-22 DIAGNOSIS — C8293 Follicular lymphoma, unspecified, intra-abdominal lymph nodes: Secondary | ICD-10-CM

## 2017-09-23 ENCOUNTER — Inpatient Hospital Stay: Payer: Medicare Other

## 2017-09-23 VITALS — BP 139/65 | HR 50 | Temp 97.9°F | Resp 16

## 2017-09-23 DIAGNOSIS — E785 Hyperlipidemia, unspecified: Secondary | ICD-10-CM | POA: Diagnosis not present

## 2017-09-23 DIAGNOSIS — Z5111 Encounter for antineoplastic chemotherapy: Secondary | ICD-10-CM | POA: Diagnosis not present

## 2017-09-23 DIAGNOSIS — Z7189 Other specified counseling: Secondary | ICD-10-CM

## 2017-09-23 DIAGNOSIS — C8293 Follicular lymphoma, unspecified, intra-abdominal lymph nodes: Secondary | ICD-10-CM

## 2017-09-23 DIAGNOSIS — Z79899 Other long term (current) drug therapy: Secondary | ICD-10-CM | POA: Diagnosis not present

## 2017-09-23 DIAGNOSIS — C8233 Follicular lymphoma grade IIIa, intra-abdominal lymph nodes: Secondary | ICD-10-CM | POA: Diagnosis not present

## 2017-09-23 DIAGNOSIS — I700 Atherosclerosis of aorta: Secondary | ICD-10-CM | POA: Diagnosis not present

## 2017-09-23 DIAGNOSIS — I10 Essential (primary) hypertension: Secondary | ICD-10-CM | POA: Diagnosis not present

## 2017-09-23 LAB — CMP (CANCER CENTER ONLY)
ALBUMIN: 3.4 g/dL — AB (ref 3.5–5.0)
ALT: 10 U/L (ref 0–55)
AST: 16 U/L (ref 5–34)
Alkaline Phosphatase: 61 U/L (ref 40–150)
Anion gap: 9 (ref 3–11)
BILIRUBIN TOTAL: 1.4 mg/dL — AB (ref 0.2–1.2)
BUN: 18 mg/dL (ref 7–26)
CHLORIDE: 104 mmol/L (ref 98–109)
CO2: 29 mmol/L (ref 22–29)
CREATININE: 1.23 mg/dL (ref 0.70–1.30)
Calcium: 8.9 mg/dL (ref 8.4–10.4)
GFR, Est AFR Am: >60 mL/min (ref >60)
GFR, Estimated: 52 mL/min — ABNORMAL LOW (ref >60)
GLUCOSE: 109 mg/dL (ref 70–140)
Potassium: 3.7 mmol/L (ref 3.5–5.1)
Sodium: 142 mmol/L (ref 136–145)
TOTAL PROTEIN: 6.4 g/dL (ref 6.4–8.3)

## 2017-09-23 LAB — CBC WITH DIFFERENTIAL (CANCER CENTER ONLY)
BASOS ABS: 0 10*3/uL (ref 0.0–0.1)
Basophils Relative: 1 %
Eosinophils Absolute: 0.3 10*3/uL (ref 0.0–0.5)
Eosinophils Relative: 8 %
HEMATOCRIT: 35.2 % — AB (ref 38.4–49.9)
Hemoglobin: 11.6 g/dL — ABNORMAL LOW (ref 13.0–17.1)
LYMPHS PCT: 27 %
Lymphs Abs: 1.1 10*3/uL (ref 0.9–3.3)
MCH: 31.6 pg (ref 27.2–33.4)
MCHC: 33 g/dL (ref 32.0–36.0)
MCV: 95.7 fL (ref 79.3–98.0)
Monocytes Absolute: 0.4 10*3/uL (ref 0.1–0.9)
Monocytes Relative: 10 %
NEUTROS ABS: 2.2 10*3/uL (ref 1.5–6.5)
NEUTROS PCT: 54 %
Platelet Count: 213 10*3/uL (ref 140–400)
RBC: 3.68 MIL/uL — AB (ref 4.20–5.82)
RDW: 13.5 % (ref 11.0–14.6)
WBC: 4.2 10*3/uL (ref 4.0–10.3)

## 2017-09-23 MED ORDER — DIPHENHYDRAMINE HCL 25 MG PO CAPS
50.0000 mg | ORAL_CAPSULE | Freq: Once | ORAL | Status: AC
  Administered 2017-09-23: 50 mg via ORAL

## 2017-09-23 MED ORDER — METHYLPREDNISOLONE SODIUM SUCC 125 MG IJ SOLR
INTRAMUSCULAR | Status: AC
  Filled 2017-09-23: qty 2

## 2017-09-23 MED ORDER — SODIUM CHLORIDE 0.9 % IV SOLN
Freq: Once | INTRAVENOUS | Status: AC
  Administered 2017-09-23: 13:00:00 via INTRAVENOUS

## 2017-09-23 MED ORDER — ACETAMINOPHEN 325 MG PO TABS
650.0000 mg | ORAL_TABLET | Freq: Once | ORAL | Status: AC
  Administered 2017-09-23: 650 mg via ORAL

## 2017-09-23 MED ORDER — DIPHENHYDRAMINE HCL 25 MG PO CAPS
ORAL_CAPSULE | ORAL | Status: AC
  Filled 2017-09-23: qty 2

## 2017-09-23 MED ORDER — METHYLPREDNISOLONE SODIUM SUCC 125 MG IJ SOLR
80.0000 mg | Freq: Every day | INTRAMUSCULAR | Status: DC
  Administered 2017-09-23: 80 mg via INTRAVENOUS

## 2017-09-23 MED ORDER — SODIUM CHLORIDE 0.9 % IV SOLN
375.0000 mg/m2 | Freq: Once | INTRAVENOUS | Status: AC
  Administered 2017-09-23: 700 mg via INTRAVENOUS
  Filled 2017-09-23: qty 20

## 2017-09-23 MED ORDER — ACETAMINOPHEN 325 MG PO TABS
ORAL_TABLET | ORAL | Status: AC
  Filled 2017-09-23: qty 2

## 2017-09-23 NOTE — Patient Instructions (Signed)
[facility] Discharge Instructions for Patients Receiving Chemotherapy  Today you received the following chemotherapy agents: rituximab (Rituxan).  To help prevent nausea and vomiting after your treatment, we encourage you to take your nausea medication as prescribed.  If you develop nausea and vomiting that is not controlled by your nausea medication, call the clinic.   BELOW ARE SYMPTOMS THAT SHOULD BE REPORTED IMMEDIATELY:  *FEVER GREATER THAN 100.5 F  *CHILLS WITH OR WITHOUT FEVER  NAUSEA AND VOMITING THAT IS NOT CONTROLLED WITH YOUR NAUSEA MEDICATION  *UNUSUAL SHORTNESS OF BREATH  *UNUSUAL BRUISING OR BLEEDING  TENDERNESS IN MOUTH AND THROAT WITH OR WITHOUT PRESENCE OF ULCERS  *URINARY PROBLEMS  *BOWEL PROBLEMS  UNUSUAL RASH Items with * indicate a potential emergency and should be followed up as soon as possible.  Feel free to call the clinic should you have any questions or concerns. The clinic phone number is [phone].  Please show the [facility] at check-in to the Emergency Department and triage nurse.

## 2017-10-24 ENCOUNTER — Ambulatory Visit (INDEPENDENT_AMBULATORY_CARE_PROVIDER_SITE_OTHER): Payer: Medicare Other | Admitting: Ophthalmology

## 2017-10-24 DIAGNOSIS — H353132 Nonexudative age-related macular degeneration, bilateral, intermediate dry stage: Secondary | ICD-10-CM

## 2017-10-24 DIAGNOSIS — I10 Essential (primary) hypertension: Secondary | ICD-10-CM

## 2017-10-24 DIAGNOSIS — H35371 Puckering of macula, right eye: Secondary | ICD-10-CM

## 2017-10-24 DIAGNOSIS — H35033 Hypertensive retinopathy, bilateral: Secondary | ICD-10-CM

## 2017-10-24 DIAGNOSIS — H43813 Vitreous degeneration, bilateral: Secondary | ICD-10-CM | POA: Diagnosis not present

## 2017-11-08 ENCOUNTER — Encounter (HOSPITAL_COMMUNITY)
Admission: RE | Admit: 2017-11-08 | Discharge: 2017-11-08 | Disposition: A | Discharge status: Home / Self Care | Payer: Medicare Other | Source: Ambulatory Visit | Attending: Hematology | Admitting: Hematology

## 2017-11-08 DIAGNOSIS — C8233 Follicular lymphoma grade IIIa, intra-abdominal lymph nodes: Secondary | ICD-10-CM | POA: Diagnosis not present

## 2017-11-08 DIAGNOSIS — C8290 Follicular lymphoma, unspecified, unspecified site: Secondary | ICD-10-CM | POA: Diagnosis not present

## 2017-11-08 LAB — GLUCOSE, CAPILLARY: GLUCOSE-CAPILLARY: 96 mg/dL (ref 65–99)

## 2017-11-08 MED ORDER — FLUDEOXYGLUCOSE F - 18 (FDG) INJECTION
7.4000 | Freq: Once | INTRAVENOUS | Status: AC | PRN
  Administered 2017-11-08: 7.4 via INTRAVENOUS

## 2017-11-11 ENCOUNTER — Inpatient Hospital Stay: Payer: Medicare Other | Attending: Hematology | Admitting: Hematology

## 2017-11-11 ENCOUNTER — Inpatient Hospital Stay: Payer: Medicare Other

## 2017-11-11 ENCOUNTER — Telehealth: Payer: Self-pay

## 2017-11-11 VITALS — BP 128/67 | HR 50 | Temp 97.8°F | Resp 18 | Ht 70.0 in | Wt 156.3 lb

## 2017-11-11 DIAGNOSIS — N281 Cyst of kidney, acquired: Secondary | ICD-10-CM | POA: Insufficient documentation

## 2017-11-11 DIAGNOSIS — I10 Essential (primary) hypertension: Secondary | ICD-10-CM | POA: Diagnosis not present

## 2017-11-11 DIAGNOSIS — K8020 Calculus of gallbladder without cholecystitis without obstruction: Secondary | ICD-10-CM | POA: Insufficient documentation

## 2017-11-11 DIAGNOSIS — I700 Atherosclerosis of aorta: Secondary | ICD-10-CM | POA: Insufficient documentation

## 2017-11-11 DIAGNOSIS — Z7982 Long term (current) use of aspirin: Secondary | ICD-10-CM | POA: Diagnosis not present

## 2017-11-11 DIAGNOSIS — Z87891 Personal history of nicotine dependence: Secondary | ICD-10-CM | POA: Diagnosis not present

## 2017-11-11 DIAGNOSIS — I712 Thoracic aortic aneurysm, without rupture: Secondary | ICD-10-CM | POA: Insufficient documentation

## 2017-11-11 DIAGNOSIS — C8233 Follicular lymphoma grade IIIa, intra-abdominal lymph nodes: Secondary | ICD-10-CM

## 2017-11-11 DIAGNOSIS — Z79899 Other long term (current) drug therapy: Secondary | ICD-10-CM | POA: Insufficient documentation

## 2017-11-11 DIAGNOSIS — K219 Gastro-esophageal reflux disease without esophagitis: Secondary | ICD-10-CM | POA: Insufficient documentation

## 2017-11-11 DIAGNOSIS — E785 Hyperlipidemia, unspecified: Secondary | ICD-10-CM | POA: Insufficient documentation

## 2017-11-11 LAB — CMP (CANCER CENTER ONLY)
ALT: 11 U/L (ref 0–55)
AST: 20 U/L (ref 5–34)
Albumin: 3.7 g/dL (ref 3.5–5.0)
Alkaline Phosphatase: 62 U/L (ref 40–150)
Anion gap: 7 (ref 3–11)
BUN: 20 mg/dL (ref 7–26)
CALCIUM: 9.7 mg/dL (ref 8.4–10.4)
CHLORIDE: 104 mmol/L (ref 98–109)
CO2: 29 mmol/L (ref 22–29)
CREATININE: 1.31 mg/dL — AB (ref 0.70–1.30)
GFR, EST AFRICAN AMERICAN: 56 mL/min — AB (ref >60)
GFR, EST NON AFRICAN AMERICAN: 49 mL/min — AB (ref >60)
Glucose, Bld: 100 mg/dL (ref 70–140)
Potassium: 4.2 mmol/L (ref 3.5–5.1)
Sodium: 140 mmol/L (ref 136–145)
Total Bilirubin: 1.6 mg/dL — ABNORMAL HIGH (ref 0.2–1.2)
Total Protein: 6.9 g/dL (ref 6.4–8.3)

## 2017-11-11 LAB — CBC WITH DIFFERENTIAL (CANCER CENTER ONLY)
BASOS PCT: 1 %
Basophils Absolute: 0.1 10*3/uL (ref 0.0–0.1)
EOS ABS: 0.5 10*3/uL (ref 0.0–0.5)
EOS PCT: 11 %
HCT: 36.5 % — ABNORMAL LOW (ref 38.4–49.9)
HEMOGLOBIN: 11.9 g/dL — AB (ref 13.0–17.1)
LYMPHS ABS: 1.4 10*3/uL (ref 0.9–3.3)
Lymphocytes Relative: 33 %
MCH: 31.8 pg (ref 27.2–33.4)
MCHC: 32.6 g/dL (ref 32.0–36.0)
MCV: 97.6 fL (ref 79.3–98.0)
MONOS PCT: 11 %
Monocytes Absolute: 0.5 10*3/uL (ref 0.1–0.9)
NEUTROS PCT: 44 %
Neutro Abs: 1.8 10*3/uL (ref 1.5–6.5)
PLATELETS: 213 10*3/uL (ref 140–400)
RBC: 3.74 MIL/uL — ABNORMAL LOW (ref 4.20–5.82)
RDW: 13.7 % (ref 11.0–14.6)
WBC: 4.2 10*3/uL (ref 4.0–10.3)

## 2017-11-11 LAB — LACTATE DEHYDROGENASE: LDH: 166 U/L (ref 125–245)

## 2017-11-11 LAB — RETICULOCYTES
RBC.: 3.74 MIL/uL — AB (ref 4.20–5.82)
RETIC CT PCT: 1.2 % (ref 0.8–1.8)
Retic Count, Absolute: 44.9 10*3/uL (ref 34.8–93.9)

## 2017-11-11 NOTE — Progress Notes (Signed)
HEMATOLOGY/ONCOLOGY CLINIC NOTE  Date of Service: 11/11/2017  Patient Care Team: [name], MD as PCP - General (Family Medicine)  CHIEF COMPLAINTS/PURPOSE OF CONSULTATION:  F/u for continued management of small bowel follicular lymphoma  HISTORY OF PRESENTING ILLNESS:   Henry Nichols is a wonderful 84 y.o. male who has been referred to [place] by Dr [name], MD for evaluation and management of a small bowel follicular lymphoma.   He presented to ED for abdominal pain and had a CT chest/abd/pelvis on 06/09/2017 with results showing: IMPRESSION: 1. Bilateral pulmonary parenchymal opacities, peribronchovascular in distribution. Favor infection or (especially given the clinical history of vomiting) aspiration. Neoplastic processes are felt unlikely but cannot be entirely excluded. Especially if the patient is diagnosed with lymphoma, consider CT followup after therapy to confirm resolution. 2. No thoracic adenopathy. 3. Mild cardiomegaly. Coronary artery atherosclerosis. Aortic Atherosclerosis (ICD10-I70.0). 4. Development of small bowel dilatation, suspicious for obstruction at the site of distal small bowel thickening on prior exam. 5. Borderline ascending aortic aneurysm. Recommend annual imaging followup by CTA or MRA. This recommendation follows 2010 ACCF/AHA/AATS/ACR/ASA/SCA/SCAI/SIR/STS/SVM Guidelines for the Diagnosis and Management of Patients with Thoracic Aortic Disease. Circulation. 2010; 121: W803-O122. 6. Nonspecific left upper lobe pulmonary nodule.   He is accompanied by his son today. He states he is doing well overall. He reports noticing a difficulty swallowing and nausea/vomiting after eating. He saw his PCP for this and had a CT scan. He was then referred to Dr. [name] and underwent a laparoscopic small bowel resection with resolution of symptoms.  He states he wasn't losing much weight before these symptoms began and lost 15 pounds after.   Pathology showed  follicular lymphoma   On review of systems, pt reports vomiting, constipation, weight loss, swelling and skin rash to the right ankle, and denies SOB, cough, bloody stools, diarrhea, fever, chills, night sweats and any other accompanying symptoms.   Henry Nichols is here for a scheduled follow-up of his Grade 3A Follicular Non-hodgkin's Lymphoma. He is doing well overall.  He had a PET/CT on 11/08/2017 which shows good response to Rituxan treatment. No new lesions noted.  Lab results today (11/11/17) of CBC, CMP, and Reticulocytes is as follows: all values are WNL except for RBC at 3.74, Hgb at 11.9, HCT at 36.5, Creatinine at 1.31, and Total Bilirubin at 1.6. His LDH at 166.   He notes concerns regarding the costs of the Rituxan and has questions about the billing which he will discussed with accounting department. We discussed pros vs cons of active surveillance with or without maintenance Rituxan. He chooses to hold off on Rituxan at this time and wants to to discuss further his family. On review of systems, pt denies fever, chills, weight loss, decreased appetite, decreased energy levels, mouth sores and bilateral lower extremity edema. Denies pain. Pt denies abdominal pain, nausea, vomiting and bladder and bowel changes.    MEDICAL HISTORY:  Past Medical History:  Diagnosis Date  . Coronary artery calcification 08/03/2017  . Essential hypertension 08/03/2017  . GERD (gastroesophageal reflux disease)   . HOH (hard of hearing)   . Hyperlipidemia 08/03/2017  . Hypertension   . LBBB (left bundle branch block) 08/03/2017  . Non-Hodgkin lymphoma ([place]) 08/03/2017  . Pneumonia    hx  . Small bowel mass   . Varicose vein of leg 08/03/2017    SURGICAL HISTORY: Past Surgical History:  Procedure Laterality Date  . EYE SURGERY Bilateral 2016  cataracts  . HERNIA REPAIR Bilateral 2008   inguinal  . LAPAROSCOPIC SMALL BOWEL RESECTION  07/06/2017  . LAPAROSCOPIC SMALL BOWEL  RESECTION N/A 07/06/2017   Procedure: LAPAROSCOPIC ASSISTED SMALL BOWEL RESECTION;  Surgeon: Coralie Keens, MD;  Location: [facility];  Service: General;  Laterality: N/A;    SOCIAL HISTORY: Social History   Socioeconomic History  . Marital status: Widowed    Spouse name: Not on file  . Number of children: Not on file  . Years of education: Not on file  . Highest education level: Not on file  Occupational History  . Not on file  Social Needs  . Financial resource strain: Not on file  . Food insecurity:    Worry: Not on file    Inability: Not on file  . Transportation needs:    Medical: Not on file    Non-medical: Not on file  Tobacco Use  . Smoking status: Former Smoker    Types: Cigars    Last attempt to quit: 06/29/1984    Years since quitting: 33.3  . Smokeless tobacco: Never Used  Substance and Sexual Activity  . Alcohol use: No    Frequency: Never  . Drug use: No  . Sexual activity: Not on file  Lifestyle  . Physical activity:    Days per week: Not on file    Minutes per session: Not on file  . Stress: Not on file  Relationships  . Social connections:    Talks on phone: Not on file    Gets together: Not on file    Attends religious service: Not on file    Active member of club or organization: Not on file    Attends meetings of clubs or organizations: Not on file    Relationship status: Not on file  . Intimate partner violence:    Fear of current or ex partner: Not on file    Emotionally abused: Not on file    Physically abused: Not on file    Forced sexual activity: Not on file  Other Topics Concern  . Not on file  Social History Narrative  . Not on file    FAMILY HISTORY: Family History  Problem Relation Age of Onset  . Alzheimer's disease Mother   . Heart disease Mother   . Heart attack Father     ALLERGIES:  has No Known Allergies.  MEDICATIONS:  Current Outpatient Medications  Medication Sig Dispense Refill  . acetaminophen (TYLENOL) 325  MG tablet Take 2 tablets (650 mg total) by mouth every 6 (six) hours as needed for mild pain or moderate pain.    Marland Kitchen albuterol (PROVENTIL HFA;VENTOLIN HFA) 108 (90 Base) MCG/ACT inhaler Inhale 1 puff every 6 (six) hours as needed into the lungs for wheezing or shortness of breath.    Marland Kitchen aspirin EC 81 MG tablet Take 81 mg daily by mouth.    Marland Kitchen atenolol-chlorthalidone (TENORETIC) 100-25 MG tablet Take 0.5 tablets daily by mouth.  1  . Multiple Vitamins-Minerals (PRESERVISION AREDS 2 PO) Take 1 capsule 2 (two) times daily by mouth.    . pantoprazole (PROTONIX) 40 MG tablet Take 40 mg daily by mouth.  3  . potassium chloride SA (K-DUR,KLOR-CON) 20 MEQ tablet Take 40 mEq daily by mouth.    . simvastatin (ZOCOR) 40 MG tablet Take 1 tablet (40 mg total) by mouth every evening. 30 tablet 5  . triamcinolone cream (KENALOG) 0.1 % Apply 1 application daily as needed topically (for dry skin).  No current facility-administered medications for this visit.     REVIEW OF SYSTEMS:    .10 Point review of Systems was done is negative except as noted above.  PHYSICAL EXAMINATION:  ECOG PERFORMANCE STATUS: 1 - Symptomatic but completely ambulatory  .BP 128/67 (BP Location: Left Arm, Patient Position: Sitting)   Pulse (!) 50   Temp 97.8 F (36.6 C) (Oral)   Resp 18   Ht 5\' 10"  (1.778 m)   Wt 156 lb 4.8 oz (70.9 kg)   SpO2 100%   BMI 22.43 kg/m   . GENERAL:alert, in no acute distress and comfortable SKIN: no acute rashes, no significant lesions EYES: conjunctiva are pink and non-injected, sclera anicteric OROPHARYNX: MMM, no exudates, no oropharyngeal erythema or ulceration NECK: supple, no JVD LYMPH:  no palpable lymphadenopathy in the cervical, axillary or inguinal regions LUNGS: clear to auscultation b/l with normal respiratory effort HEART: regular rate & rhythm ABDOMEN:  normoactive bowel sounds , non tender, not distended. Extremity: no pedal edema PSYCH: alert & oriented x 3 with fluent  speech NEURO: no focal motor/sensory deficits   LABORATORY DATA:  I have reviewed the data as listed . CBC Latest Ref Rng & Units 11/11/2017 09/23/2017 09/16/2017  WBC 4.0 - 10.3 K/uL 4.2 4.2 5.0  Hemoglobin 13.0 - 17.1 g/dL - - -  Hematocrit 38.4 - 49.9 % 36.5(L) 35.2(L) 37.9(L)  Platelets 140 - 400 K/uL 213 213 228  HGB 11.9  CMP Latest Ref Rng & Units 11/11/2017 09/23/2017 09/16/2017  Glucose 70 - 140 mg/dL 100 109 70  BUN 7 - 26 mg/dL 20 18 21   Creatinine 0.70 - 1.30 mg/dL 1.31(H) 1.23 1.19  Sodium 136 - 145 mmol/L 140 142 142  Potassium 3.5 - 5.1 mmol/L 4.2 3.7 4.0  Chloride 98 - 109 mmol/L 104 104 104  CO2 22 - 29 mmol/L 29 29 30(H)  Calcium 8.4 - 10.4 mg/dL 9.7 8.9 9.0  Total Protein 6.4 - 8.3 g/dL 6.9 6.4 6.5  Total Bilirubin 0.2 - 1.2 mg/dL 1.6(H) 1.4(H) 1.2  Alkaline Phos 40 - 150 U/L 62 61 67  AST 5 - 34 U/L 20 16 15   ALT 0 - 55 U/L 11 10 10    . Lab Results  Component Value Date   LDH 166 11/11/2017        CT CHEST ABD 06/14/2017 IMPRESSION: 1. Bilateral pulmonary parenchymal opacities, peribronchovascular in distribution. Favor infection or (especially given the clinical history of vomiting) aspiration. Neoplastic processes are felt unlikely but cannot be entirely excluded. Especially if the patient is diagnosed with lymphoma, consider CT followup after therapy to confirm resolution. 2. No thoracic adenopathy. 3. Mild cardiomegaly. Coronary artery atherosclerosis. Aortic Atherosclerosis (ICD10-I70.0). 4. Development of small bowel dilatation, suspicious for obstruction at the site of distal small bowel thickening on prior exam. 5. Borderline ascending aortic aneurysm. Recommend annual imaging followup by CTA or MRA. This recommendation follows 2010 ACCF/AHA/AATS/ACR/ASA/SCA/SCAI/SIR/STS/SVM Guidelines for the Diagnosis and Management of Patients with Thoracic Aortic Disease. Circulation. 2010; 121: Z300-T622. 6. Nonspecific left upper lobe pulmonary  nodule.   RADIOGRAPHIC STUDIES: I have personally reviewed the radiological images as listed and agreed with the findings in the report. Nm Pet Image Restag (ps) Skull Base To Thigh  Result Date: 11/09/2017 CLINICAL DATA:  Subsequent treatment strategy for follicular lymphoma. EXAM: NUCLEAR MEDICINE PET SKULL BASE TO THIGH TECHNIQUE: 7.4 mCi F-18 FDG was injected intravenously. Full-ring PET imaging was performed from the skull base to thigh after the radiotracer. CT data  was obtained and used for attenuation correction and anatomic localization. Fasting blood glucose: 96 mg/dl COMPARISON:  PET-CT 08/22/2017 FINDINGS: Mediastinal blood pool activity: SUV max 2.45 NECK: No hypermetabolic lymph nodes in the neck. Incidental CT findings: none CHEST: No hypermetabolic mediastinal or hilar nodes. No suspicious pulmonary nodules on the CT scan. Incidental CT findings: Stable aortic, mitral valve annulus and coronary artery calcifications. ABDOMEN/PELVIS: The nodal mass in the small bowel mesentery is significantly improved. It previously measured 3.3 x 2.5 cm and there are now two small residual lymph nodes. The more anterior/superior node measures 9 mm and the more posterior/inferior node measures 10 mm. SUV max is 3.5 and was previously 11.1. Incidental CT findings: Stable surgical changes involving the small bowel in the pelvis. Stable cholelithiasis Stable renal cysts. Stable atherosclerotic calcifications involving the aorta and iliac arteries. SKELETON: No focal hypermetabolic activity to suggest skeletal metastasis. Incidental CT findings: none IMPRESSION: Much improved mesenteric mass as described above. Two small mildly hypermetabolic lymph nodes remain. No other areas of adenopathy are identified in the neck, chest, abdomen or pelvis. Electronically Signed   By: Marijo Sanes M.D.   On: 11/09/2017 08:36    ASSESSMENT & PLAN:   Henry Nichols is a wonderful 84 y.o. male with   #1 Stage IVAE high  grade Grade 3A Small-bowel follicular Lymphoma with ileocolic progressive LNadenopathy. No type B symptoms S/p Rituxan weekly x 4 doses Rpt PET/CT 11/08/2017 showed Much improved mesenteric mass as described above. Two small mildly hypermetabolic lymph nodes remain. No other areas of adenopathy are identified in the neck, chest, abdomen or pelvis. PLAN -labs and PET/CT reviewed in details with patient. He appears to have had a good response to Rituxan and has no symptoms or radiographic features of disease progression at this time. -We discussed pros vs cons of active surveillance with or without maintenance Rituxan. He chooses to hold off on Rituxan at this time and wants to to discuss further his family. -we will schedule him for a clinic f/u with labs in 3 months and a f/u with CT in 6 months. -he notes to call us if any new symptoms arise.  RTC with Dr Irene Limbo in 3 months with labs  All of the patients questions were answered with apparent satisfaction. The patient knows to call the clinic with any problems, questions or concerns.  . The total time spent in the appointment was 20 minutes and more than 50% was on counseling and direct patient cares.   Sullivan Lone MD [place] AAHIVMS [facility] Hematology/Oncology Physician [facility]  (Office):       [phone] (Work cell):  [phone] (Fax):           [phone]  This document serves as a record of services personally performed by Sullivan Lone, MD. It was created on his behalf by Margit Banda, a trained medical scribe. The creation of this record is based on the scribe's personal observations and the provider's statements to them.   .I have reviewed the above documentation for accuracy and completeness, and I agree with the above. Brunetta Genera MD MS

## 2017-11-11 NOTE — Telephone Encounter (Signed)
Printed avs and calender of upcoming appointment. Per 3/29 los

## 2017-12-16 DIAGNOSIS — C8290 Follicular lymphoma, unspecified, unspecified site: Secondary | ICD-10-CM | POA: Diagnosis not present

## 2017-12-16 DIAGNOSIS — I872 Venous insufficiency (chronic) (peripheral): Secondary | ICD-10-CM | POA: Diagnosis not present

## 2017-12-16 DIAGNOSIS — J449 Chronic obstructive pulmonary disease, unspecified: Secondary | ICD-10-CM | POA: Diagnosis not present

## 2017-12-16 DIAGNOSIS — J309 Allergic rhinitis, unspecified: Secondary | ICD-10-CM | POA: Diagnosis not present

## 2017-12-16 DIAGNOSIS — E7800 Pure hypercholesterolemia, unspecified: Secondary | ICD-10-CM | POA: Diagnosis not present

## 2017-12-16 DIAGNOSIS — K219 Gastro-esophageal reflux disease without esophagitis: Secondary | ICD-10-CM | POA: Diagnosis not present

## 2017-12-16 DIAGNOSIS — I10 Essential (primary) hypertension: Secondary | ICD-10-CM | POA: Diagnosis not present

## 2017-12-16 DIAGNOSIS — R600 Localized edema: Secondary | ICD-10-CM | POA: Diagnosis not present

## 2018-02-09 NOTE — Progress Notes (Signed)
HEMATOLOGY/ONCOLOGY CLINIC NOTE  Date of Service: 02/10/2018  Patient Care Team: [name], MD as PCP - General (Family Medicine)  CHIEF COMPLAINTS/PURPOSE OF CONSULTATION:  F/u for continued management of small bowel follicular lymphoma  HISTORY OF PRESENTING ILLNESS:   Henry Nichols is a wonderful 84 y.o. male who has been referred to [place] by Dr [name], MD for evaluation and management of a small bowel follicular lymphoma.   He presented to ED for abdominal pain and had a CT chest/abd/pelvis on 06/09/2017 with results showing: IMPRESSION: 1. Bilateral pulmonary parenchymal opacities, peribronchovascular in distribution. Favor infection or (especially given the clinical history of vomiting) aspiration. Neoplastic processes are felt unlikely but cannot be entirely excluded. Especially if the patient is diagnosed with lymphoma, consider CT followup after therapy to confirm resolution. 2. No thoracic adenopathy. 3. Mild cardiomegaly. Coronary artery atherosclerosis. Aortic Atherosclerosis (ICD10-I70.0). 4. Development of small bowel dilatation, suspicious for obstruction at the site of distal small bowel thickening on prior exam. 5. Borderline ascending aortic aneurysm. Recommend annual imaging followup by CTA or MRA. This recommendation follows 2010 ACCF/AHA/AATS/ACR/ASA/SCA/SCAI/SIR/STS/SVM Guidelines for the Diagnosis and Management of Patients with Thoracic Aortic Disease. Circulation. 2010; 121: R443-X540. 6. Nonspecific left upper lobe pulmonary nodule.   He is accompanied by his son today. He states he is doing well overall. He reports noticing a difficulty swallowing and nausea/vomiting after eating. He saw his PCP for this and had a CT scan. He was then referred to Dr. [name] and underwent a laparoscopic small bowel resection with resolution of symptoms.  He states he wasn't losing much weight before these symptoms began and lost 15 pounds after.   Pathology showed  follicular lymphoma   On review of systems, pt reports vomiting, constipation, weight loss, swelling and skin rash to the right ankle, and denies SOB, cough, bloody stools, diarrhea, fever, chills, night sweats and any other accompanying symptoms.   [place] is here for a scheduled follow-up of his Grade 3A Follicular Non-hodgkin's Lymphoma. The patient's last visit with [place] was on 11/11/17. The pt reports that he is doing very well overall.   The pt reports that he has had no new concerns and denies any constitutional symptoms. 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   His right leg swelling has been stable, and has been previously evaluated with a Vas [place] LE Venous Right and showed no evidence of a DVT.   Lab results today (02/10/18) of CBC w/diff, and Reticulocytes is as follows: all values are WNL except for RBC at 3.96, HGB at 12.6. CMP 02/10/18 reviewed LDH 02/10/18 is 201  On review of systems, pt reports good energy levels, weight gain, normal bowel movements, and denies fevers, chills, night sweats, mouth sores, abdominal pains, testicular pain or swelling, and any other symptoms.   MEDICAL HISTORY:  Past Medical History:  Diagnosis Date  . Coronary artery calcification 08/03/2017  . Essential hypertension 08/03/2017  . GERD (gastroesophageal reflux disease)   . HOH (hard of hearing)   . Hyperlipidemia 08/03/2017  . Hypertension   . LBBB (left bundle branch block) 08/03/2017  . Non-Hodgkin lymphoma ([place]) 08/03/2017  . Pneumonia    hx  . Small bowel mass   . Varicose vein of leg 08/03/2017    SURGICAL HISTORY: Past Surgical History:  Procedure Laterality Date  . EYE SURGERY Bilateral 2016   cataracts  . HERNIA REPAIR  Bilateral 2008   inguinal  . LAPAROSCOPIC SMALL BOWEL RESECTION  07/06/2017  . LAPAROSCOPIC SMALL BOWEL RESECTION N/A 07/06/2017   Procedure: LAPAROSCOPIC ASSISTED SMALL BOWEL  RESECTION;  Surgeon: Coralie Keens, MD;  Location: [facility];  Service: General;  Laterality: N/A;    SOCIAL HISTORY: Social History   Socioeconomic History  . Marital status: Widowed    Spouse name: Not on file  . Number of children: Not on file  . Years of education: Not on file  . Highest education level: Not on file  Occupational History  . Not on file  Social Needs  . Financial resource strain: Not on file  . Food insecurity:    Worry: Not on file    Inability: Not on file  . Transportation needs:    Medical: Not on file    Non-medical: Not on file  Tobacco Use  . Smoking status: Former Smoker    Types: Cigars    Last attempt to quit: 06/29/1984    Years since quitting: 33.6  . Smokeless tobacco: Never Used  Substance and Sexual Activity  . Alcohol use: No    Frequency: Never  . Drug use: No  . Sexual activity: Not on file  Lifestyle  . Physical activity:    Days per week: Not on file    Minutes per session: Not on file  . Stress: Not on file  Relationships  . Social connections:    Talks on phone: Not on file    Gets together: Not on file    Attends religious service: Not on file    Active member of club or organization: Not on file    Attends meetings of clubs or organizations: Not on file    Relationship status: Not on file  . Intimate partner violence:    Fear of current or ex partner: Not on file    Emotionally abused: Not on file    Physically abused: Not on file    Forced sexual activity: Not on file  Other Topics Concern  . Not on file  Social History Narrative  . Not on file    FAMILY HISTORY: Family History  Problem Relation Age of Onset  . Alzheimer's disease Mother   . Heart disease Mother   . Heart attack Father     ALLERGIES:  has No Known Allergies.  MEDICATIONS:  Current Outpatient Medications  Medication Sig Dispense Refill  . acetaminophen (TYLENOL) 325 MG tablet Take 2 tablets (650 mg total) by mouth every 6 (six) hours as  needed for mild pain or moderate pain.    Marland Kitchen albuterol (PROVENTIL HFA;VENTOLIN HFA) 108 (90 Base) MCG/ACT inhaler Inhale 1 puff every 6 (six) hours as needed into the lungs for wheezing or shortness of breath.    Marland Kitchen aspirin EC 81 MG tablet Take 81 mg daily by mouth.    Marland Kitchen atenolol-chlorthalidone (TENORETIC) 100-25 MG tablet Take 0.5 tablets daily by mouth.  1  . Multiple Vitamins-Minerals (PRESERVISION AREDS 2 PO) Take 1 capsule 2 (two) times daily by mouth.    . pantoprazole (PROTONIX) 40 MG tablet Take 40 mg daily by mouth.  3  . potassium chloride SA (K-DUR,KLOR-CON) 20 MEQ tablet Take 40 mEq daily by mouth.    . simvastatin (ZOCOR) 40 MG tablet Take 1 tablet (40 mg total) by mouth every evening. 30 tablet 5  . triamcinolone cream (KENALOG) 0.1 % Apply 1 application daily as needed topically (for dry skin).     No current  facility-administered medications for this visit.     REVIEW OF SYSTEMS:    A 10+ POINT REVIEW OF SYSTEMS WAS OBTAINED including neurology, dermatology, psychiatry, cardiac, respiratory, lymph, extremities, GI, GU, Musculoskeletal, constitutional, breasts, reproductive, HEENT.  All pertinent positives are noted in the HPI.  All others are negative.   PHYSICAL EXAMINATION:  ECOG PERFORMANCE STATUS: 1 - Symptomatic but completely ambulatory  .BP (!) 142/76 (BP Location: Left Arm, Patient Position: Sitting)   Pulse (!) 51   Temp 98.2 F (36.8 C) (Oral)   Resp 17   Ht 5\' 10"  (1.778 m)   Wt 154 lb 3.2 oz (69.9 kg)   SpO2 100%   BMI 22.13 kg/m   GENERAL:alert, in no acute distress and comfortable SKIN: no acute rashes, no significant lesions EYES: conjunctiva are pink and non-injected, sclera anicteric OROPHARYNX: MMM, no exudates, no oropharyngeal erythema or ulceration NECK: supple, no JVD LYMPH:  no palpable lymphadenopathy in the cervical, axillary or inguinal regions LUNGS: clear to auscultation b/l with normal respiratory effort HEART: regular rate &  rhythm ABDOMEN:  normoactive bowel sounds , non tender, not distended. Extremity: no pedal edema PSYCH: alert & oriented x 3 with fluent speech NEURO: no focal motor/sensory deficits   LABORATORY DATA:  I have reviewed the data as listed . CBC Latest Ref Rng & Units 02/10/2018 11/11/2017 09/23/2017  WBC 4.0 - 10.3 K/uL 4.4 4.2 4.2  Hemoglobin 13.0 - 17.1 g/dL 12.6(L) 11.9(L) 11.6(L)  Hematocrit 38.4 - 49.9 % 38.7 36.5(L) 35.2(L)  Platelets 140 - 400 K/uL 196 213 213  HGB 11.9  CMP Latest Ref Rng & Units 02/10/2018 11/11/2017 09/23/2017  Glucose 70 - 99 mg/dL 101(H) 100 109  BUN 8 - 23 mg/dL 22 20 18   Creatinine 0.61 - 1.24 mg/dL 1.38(H) 1.31(H) 1.23  Sodium 135 - 145 mmol/L 144 140 142  Potassium 3.5 - 5.1 mmol/L 3.9 4.2 3.7  Chloride 98 - 111 mmol/L 105 104 104  CO2 22 - 32 mmol/L 31 29 29   Calcium 8.9 - 10.3 mg/dL 9.4 9.7 8.9  Total Protein 6.5 - 8.1 g/dL 7.3 6.9 6.4  Total Bilirubin 0.3 - 1.2 mg/dL 1.9(H) 1.6(H) 1.4(H)  Alkaline Phos 38 - 126 U/L 59 62 61  AST 15 - 41 U/L 26 20 16   ALT 0 - 44 U/L 17 11 10    . Lab Results  Component Value Date   LDH 166 11/11/2017        CT CHEST ABD 06/14/2017 IMPRESSION: 1. Bilateral pulmonary parenchymal opacities, peribronchovascular in distribution. Favor infection or (especially given the clinical history of vomiting) aspiration. Neoplastic processes are felt unlikely but cannot be entirely excluded. Especially if the patient is diagnosed with lymphoma, consider CT followup after therapy to confirm resolution. 2. No thoracic adenopathy. 3. Mild cardiomegaly. Coronary artery atherosclerosis. Aortic Atherosclerosis (ICD10-I70.0). 4. Development of small bowel dilatation, suspicious for obstruction at the site of distal small bowel thickening on prior exam. 5. Borderline ascending aortic aneurysm. Recommend annual imaging followup by CTA or MRA. This recommendation follows 2010 ACCF/AHA/AATS/ACR/ASA/SCA/SCAI/SIR/STS/SVM Guidelines  for the Diagnosis and Management of Patients with Thoracic Aortic Disease. Circulation. 2010; 121: G811-X726. 6. Nonspecific left upper lobe pulmonary nodule.   RADIOGRAPHIC STUDIES: I have personally reviewed the radiological images as listed and agreed with the findings in the report. No results found.  ASSESSMENT & PLAN:   Henry Nichols is a wonderful 84 y.o. male with   #1 Stage IVAE high grade Grade 3A Small-bowel follicular Lymphoma  with ileocolic progressive LNadenopathy. No type B symptoms S/p Rituxan weekly x 4 doses Rpt PET/CT 11/08/2017 showed Much improved mesenteric mass as described above. Two small mildly hypermetabolic lymph nodes remain. No other areas of adenopathy are identified in the neck, chest, abdomen or pelvis . PLAN  -Discussed pt labwork today, 02/10/18; HGB improved to 12.6, blood counts normal -he notes to call us if any new symptoms arise. -he chooses to not pursue maintenance RItuxan. -Will see pt back in 3-4 months with repeat CT scan -Continue staying active, eat well, and stay hydrated -The pt shows no clinical or lab progression of his follicular lymphoma at this time.  -No indication for further treatment at this time.    CT chest/abd/pelvis in 16 weeks RTC with Dr Irene Limbo in 4 months with labs    All of the patients questions were answered with apparent satisfaction. The patient knows to call the clinic with any problems, questions or concerns.  . The total time spent in the appointment was 15 minutes and more than 50% was on counseling and direct patient cares.    Sullivan Lone MD [place] AAHIVMS [facility] Hematology/Oncology Physician [facility]  (Office):       [phone] (Work cell):  [phone] (Fax):           [phone]  I, Baldwin [place], am acting as a scribe for Dr Irene Limbo.   .I have reviewed the above documentation for accuracy and completeness, and I agree with the above. Brunetta Genera MD

## 2018-02-10 ENCOUNTER — Inpatient Hospital Stay: Payer: Medicare Other | Attending: Hematology | Admitting: Hematology

## 2018-02-10 ENCOUNTER — Telehealth: Payer: Self-pay | Admitting: Hematology

## 2018-02-10 ENCOUNTER — Inpatient Hospital Stay: Payer: Medicare Other

## 2018-02-10 VITALS — BP 142/76 | HR 51 | Temp 98.2°F | Resp 17 | Ht 70.0 in | Wt 154.2 lb

## 2018-02-10 DIAGNOSIS — E785 Hyperlipidemia, unspecified: Secondary | ICD-10-CM | POA: Diagnosis not present

## 2018-02-10 DIAGNOSIS — C8233 Follicular lymphoma grade IIIa, intra-abdominal lymph nodes: Secondary | ICD-10-CM | POA: Insufficient documentation

## 2018-02-10 DIAGNOSIS — I10 Essential (primary) hypertension: Secondary | ICD-10-CM | POA: Insufficient documentation

## 2018-02-10 DIAGNOSIS — I700 Atherosclerosis of aorta: Secondary | ICD-10-CM | POA: Diagnosis not present

## 2018-02-10 DIAGNOSIS — I2510 Atherosclerotic heart disease of native coronary artery without angina pectoris: Secondary | ICD-10-CM | POA: Diagnosis not present

## 2018-02-10 DIAGNOSIS — K219 Gastro-esophageal reflux disease without esophagitis: Secondary | ICD-10-CM | POA: Insufficient documentation

## 2018-02-10 DIAGNOSIS — Z79899 Other long term (current) drug therapy: Secondary | ICD-10-CM | POA: Diagnosis not present

## 2018-02-10 DIAGNOSIS — Z9221 Personal history of antineoplastic chemotherapy: Secondary | ICD-10-CM | POA: Insufficient documentation

## 2018-02-10 DIAGNOSIS — Z7982 Long term (current) use of aspirin: Secondary | ICD-10-CM | POA: Insufficient documentation

## 2018-02-10 DIAGNOSIS — Z87891 Personal history of nicotine dependence: Secondary | ICD-10-CM | POA: Diagnosis not present

## 2018-02-10 LAB — COMPREHENSIVE METABOLIC PANEL
ALBUMIN: 4.2 g/dL (ref 3.5–5.0)
ALK PHOS: 59 U/L (ref 38–126)
ALT: 17 U/L (ref 0–44)
ANION GAP: 8 (ref 5–15)
AST: 26 U/L (ref 15–41)
BUN: 22 mg/dL (ref 8–23)
CALCIUM: 9.4 mg/dL (ref 8.9–10.3)
CHLORIDE: 105 mmol/L (ref 98–111)
CO2: 31 mmol/L (ref 22–32)
Creatinine, Ser: 1.38 mg/dL — ABNORMAL HIGH (ref 0.61–1.24)
GFR calc non Af Amer: 46 mL/min — ABNORMAL LOW (ref >60)
GFR, EST AFRICAN AMERICAN: 53 mL/min — AB (ref >60)
GLUCOSE: 101 mg/dL — AB (ref 70–99)
Potassium: 3.9 mmol/L (ref 3.5–5.1)
SODIUM: 144 mmol/L (ref 135–145)
Total Bilirubin: 1.9 mg/dL — ABNORMAL HIGH (ref 0.3–1.2)
Total Protein: 7.3 g/dL (ref 6.5–8.1)

## 2018-02-10 LAB — CBC WITH DIFFERENTIAL (CANCER CENTER ONLY)
BASOS ABS: 0.1 10*3/uL (ref 0.0–0.1)
Basophils Relative: 1 %
Eosinophils Absolute: 0.4 10*3/uL (ref 0.0–0.5)
Eosinophils Relative: 10 %
HCT: 38.7 % (ref 38.4–49.9)
HEMOGLOBIN: 12.6 g/dL — AB (ref 13.0–17.1)
LYMPHS ABS: 1.2 10*3/uL (ref 0.9–3.3)
LYMPHS PCT: 28 %
MCH: 31.8 pg (ref 27.2–33.4)
MCHC: 32.6 g/dL (ref 32.0–36.0)
MCV: 97.7 fL (ref 79.3–98.0)
Monocytes Absolute: 0.5 10*3/uL (ref 0.1–0.9)
Monocytes Relative: 12 %
NEUTROS PCT: 49 %
Neutro Abs: 2.1 10*3/uL (ref 1.5–6.5)
Platelet Count: 196 10*3/uL (ref 140–400)
RBC: 3.96 MIL/uL — AB (ref 4.20–5.82)
RDW: 13.4 % (ref 11.0–14.6)
WBC: 4.4 10*3/uL (ref 4.0–10.3)

## 2018-02-10 LAB — LACTATE DEHYDROGENASE: LDH: 201 U/L — ABNORMAL HIGH (ref 98–192)

## 2018-02-10 LAB — RETICULOCYTES
RBC.: 3.96 MIL/uL — ABNORMAL LOW (ref 4.20–5.82)
RETIC CT PCT: 1.2 % (ref 0.8–1.8)
Retic Count, Absolute: 47.5 10*3/uL (ref 34.8–93.9)

## 2018-02-10 NOTE — Telephone Encounter (Signed)
Appointments scheduled AVS/Calendar/ contrast material provided per 6/28 los

## 2018-05-18 IMAGING — CT NM PET TUM IMG INITIAL (PI) SKULL BASE T - THIGH
8 series · 25 of 25 positions shown · non-contrast
Comparison: Abdominopelvic CT 06/09/2017.  Chest CT of 06/14/2017

CLINICAL DATA: Initial treatment strategy for follicular lymphoma.
History of non-Hodgkin's lymphoma and small bowel mass.

EXAM:
NUCLEAR MEDICINE PET SKULL BASE TO THIGH
TECHNIQUE: 7.2 MCi F-18 FDG was injected intravenously. Full-ring PET imaging
was performed from the skull base to thigh after the radiotracer. CT
data was obtained and used for attenuation correction and anatomic
localization.
FASTING BLOOD GLUCOSE:  Value: 95 mg/dl

[Series 3: pet sk_thigh ac · axial · 5.0mm · 4.07mm/px · z∈[-1494,-502]mm · 5 of 249 slices shown]
[im 1/249]
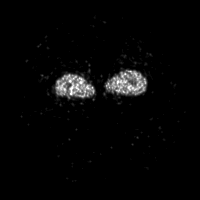
[im 63/249]
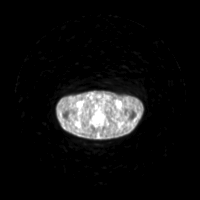
[im 125/249]
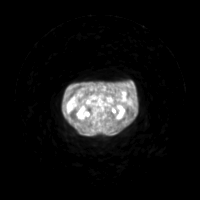
[im 187/249]
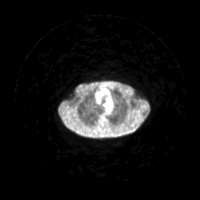
[im 249/249]
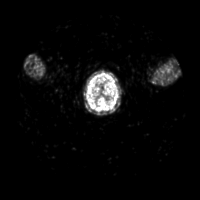

[Series 4: ct sk_thigh 5.0 b31f · axial · 5.0mm · 0.98mm/px · z∈[-1494,-502]mm · 5 of 249 slices shown]
[im 1/249]
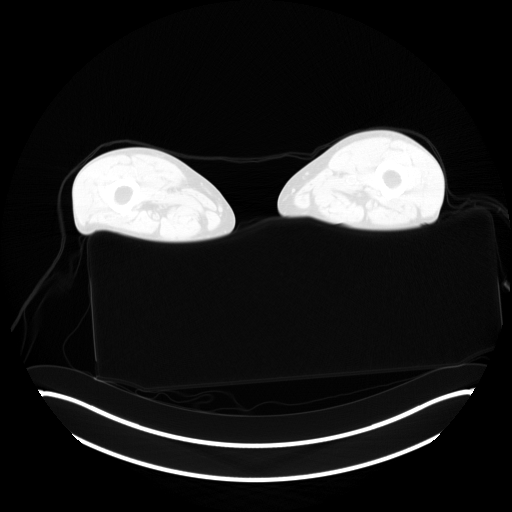
[im 63/249]
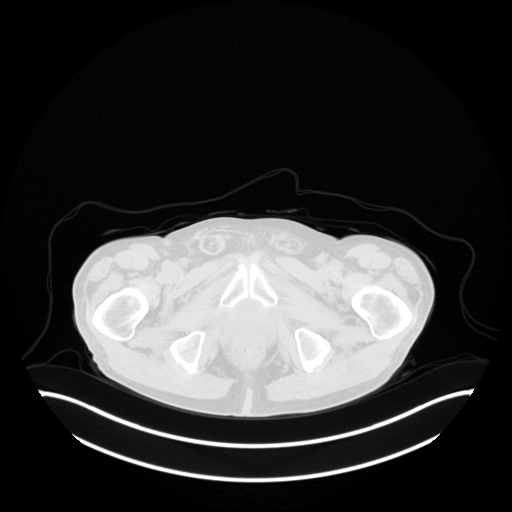
[im 125/249]
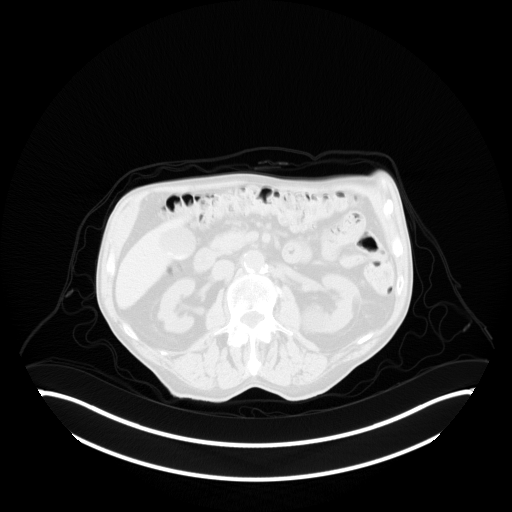
[im 187/249]
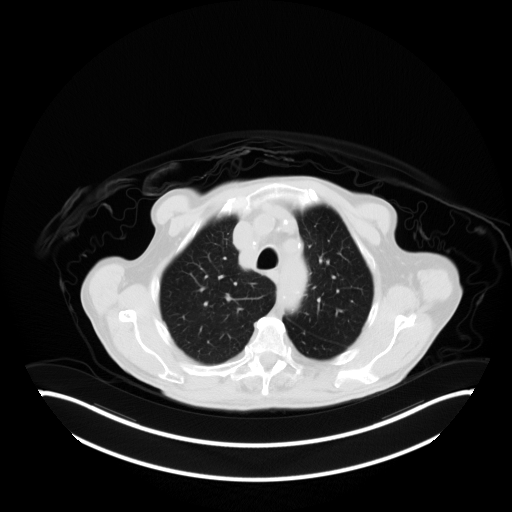
[im 249/249  brain]
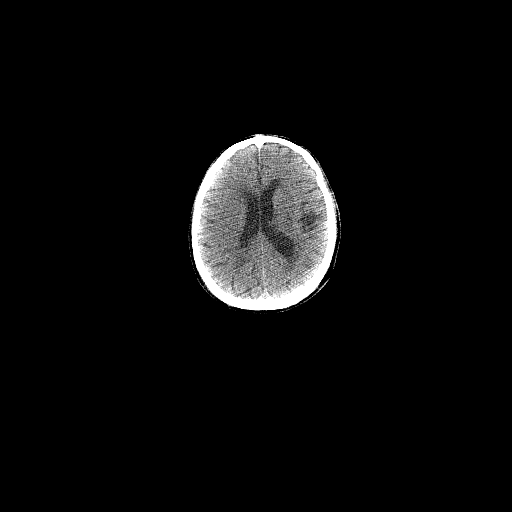

[Series 5: pet sk_thigh nac · axial · 5.0mm · 4.07mm/px · z∈[-1494,-502]mm · 5 of 249 slices shown]
[im 1/249]
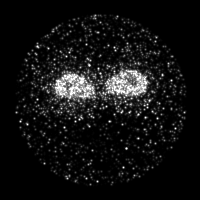
[im 63/249]
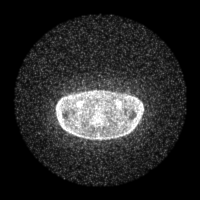
[im 125/249]
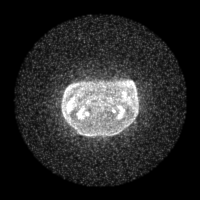
[im 187/249]
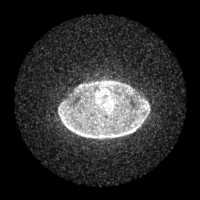
[im 249/249]
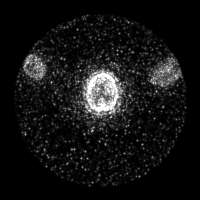

[Series 8: ct sk_thigh 5.0 (id) lung_bone · axial · 5.0mm · 0.66mm/px · z∈[-960,-688]mm · 2 of 69 slices shown]
[im 1/69  bone]
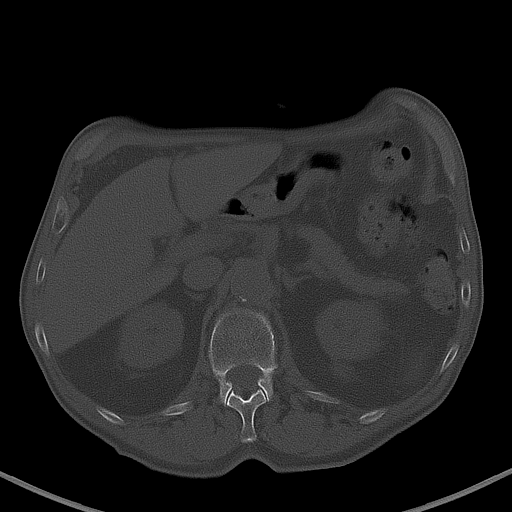
[im 69/69  bone]
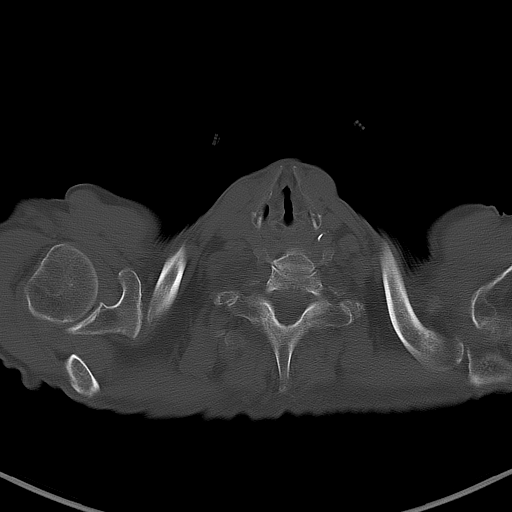

[Series 603: range-ct sk_thigh 5.0 (id)<alpha range> · 1 of 59 slices shown (1 of 2)]
[im 1/59]
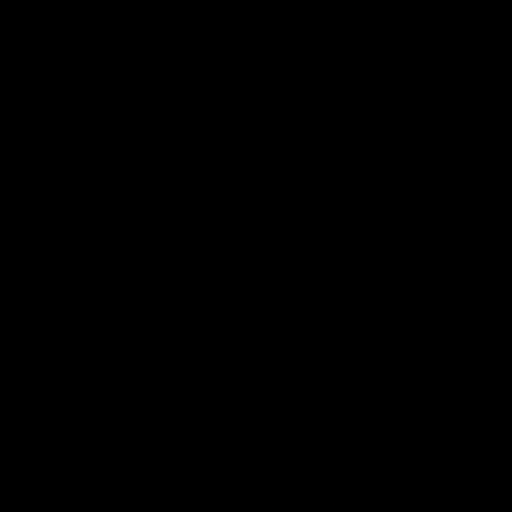

[Series 604: mip range · coronal · 2.06mm/px · 1 of 32 slices shown]
[im 1/32]
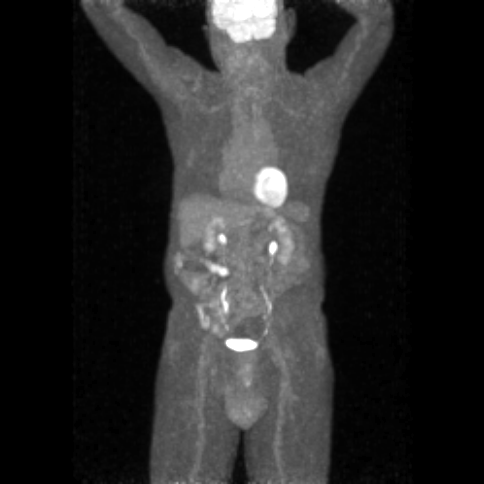

[Series 605: range-ct sk_thigh 5.0 (id)<alpha range> · 5 of 234 slices shown (2 of 2)]
[im 1/234]
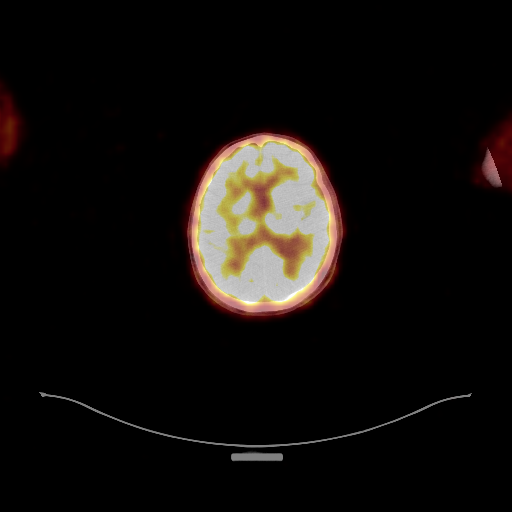
[im 59/234]
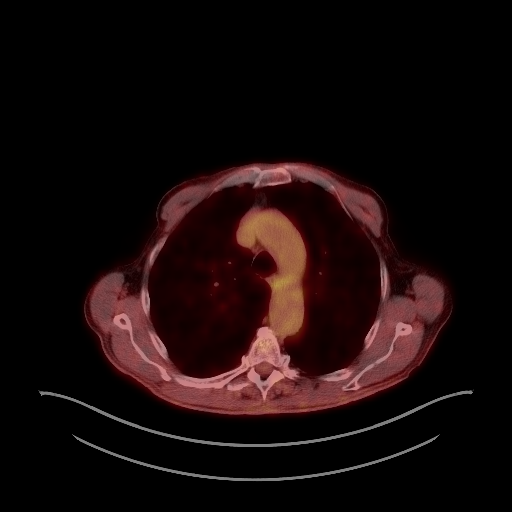
[im 117/234]
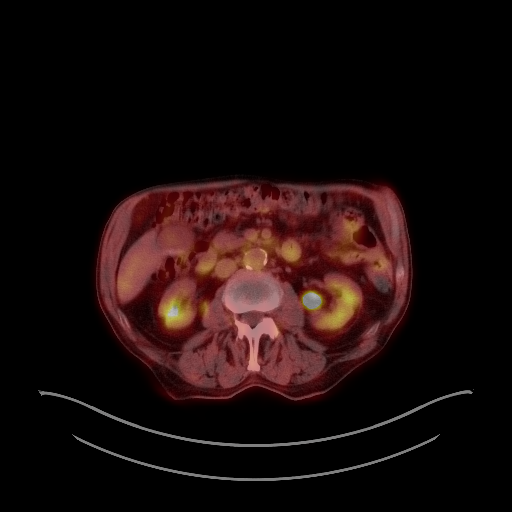
[im 175/234]
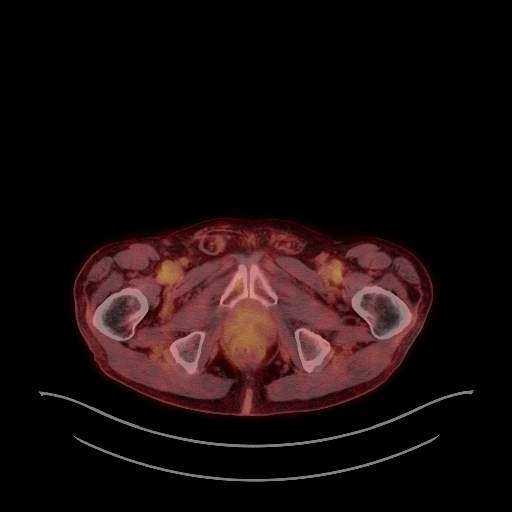
[im 234/234]
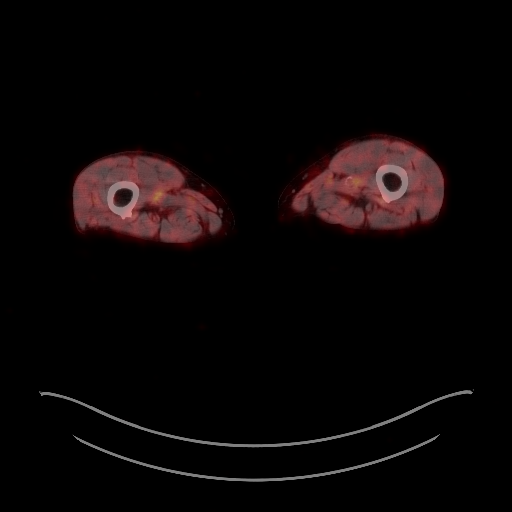

[Series 1032: results mm oncology reading · 4.0mm · 1.09mm/px · 1 of 1 slices shown]
[im 1/1]
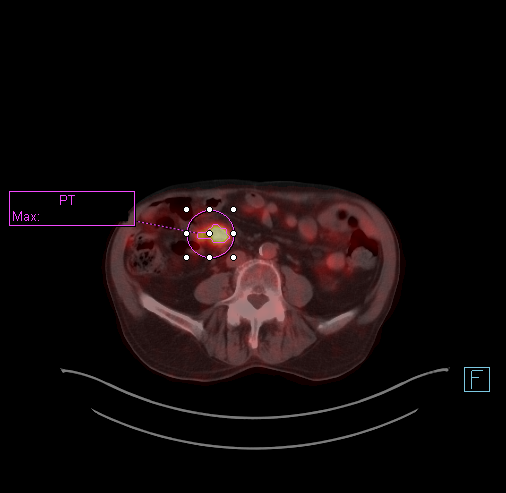

[25 of 25 positions shown; findings below may reference images not displayed]

FINDINGS: NECK: No areas of abnormal hypermetabolism. No cervical adenopathy.
Bilateral carotid atherosclerosis. Mucosal thickening of bilateral
ethmoid air cells and left sphenoid sinus.

CHEST: No pulmonary parenchymal or thoracic nodal hypermetabolism
identified. Mild cardiomegaly with multivessel coronary artery
atherosclerosis. No thoracic adenopathy. Clearing of pulmonary
opacities since the prior diagnostic CT.

ABDOMEN/PELVIS: Hypermetabolic ileocolic mesenteric nodal mass.
x 2.5 cm and a S.U.V. max of 11.1 on image 142/series 4. Adenopathy
in this region measured maximally 1.4 x 1.8 cm on the prior
diagnostic CT (when remeasured). Interval with surgical sutures in
the adjacent small bowel.

Left renal cyst. Bilateral renal scarring. Too small to characterize
interpolar left renal lesion. Cholelithiasis. Abdominal aortic
atherosclerosis. Underdistended right pelvic small bowel, including
on image 170/series 4. Moderate prostatomegaly.

SKELETON: No abnormal marrow activity. Bilateral hip osteoarthritis.
Degenerative partial fusion of the bilateral sacroiliac joints.
IMPRESSION: 1. Isolated hypermetabolic pericolic mesenteric nodal
mass,consistent with active lymphoma. This is progressive since
06/09/2017.
[DATE]. No other evidence of hypermetabolic active lymphoma.
3. Coronary artery atherosclerosis. Aortic Atherosclerosis
([id number]).
4. Cholelithiasis.

## 2018-05-29 ENCOUNTER — Inpatient Hospital Stay: Payer: Medicare Other | Attending: Hematology

## 2018-05-29 DIAGNOSIS — N281 Cyst of kidney, acquired: Secondary | ICD-10-CM | POA: Insufficient documentation

## 2018-05-29 DIAGNOSIS — Z9221 Personal history of antineoplastic chemotherapy: Secondary | ICD-10-CM | POA: Insufficient documentation

## 2018-05-29 DIAGNOSIS — Z79899 Other long term (current) drug therapy: Secondary | ICD-10-CM | POA: Diagnosis not present

## 2018-05-29 DIAGNOSIS — F17290 Nicotine dependence, other tobacco product, uncomplicated: Secondary | ICD-10-CM | POA: Diagnosis not present

## 2018-05-29 DIAGNOSIS — C8233 Follicular lymphoma grade IIIa, intra-abdominal lymph nodes: Secondary | ICD-10-CM | POA: Diagnosis not present

## 2018-05-29 DIAGNOSIS — Z7982 Long term (current) use of aspirin: Secondary | ICD-10-CM | POA: Insufficient documentation

## 2018-05-29 DIAGNOSIS — I10 Essential (primary) hypertension: Secondary | ICD-10-CM | POA: Insufficient documentation

## 2018-05-29 LAB — COMPREHENSIVE METABOLIC PANEL
ALBUMIN: 3.9 g/dL (ref 3.5–5.0)
ALK PHOS: 58 U/L (ref 38–126)
ALT: 18 U/L (ref 0–44)
ANION GAP: 7 (ref 5–15)
AST: 24 U/L (ref 15–41)
BILIRUBIN TOTAL: 1.6 mg/dL — AB (ref 0.3–1.2)
BUN: 14 mg/dL (ref 8–23)
CO2: 31 mmol/L (ref 22–32)
Calcium: 9.5 mg/dL (ref 8.9–10.3)
Chloride: 106 mmol/L (ref 98–111)
Creatinine, Ser: 1.2 mg/dL (ref 0.61–1.24)
GFR calc Af Amer: >60 mL/min (ref >60)
GFR, EST NON AFRICAN AMERICAN: 54 mL/min — AB (ref >60)
GLUCOSE: 105 mg/dL — AB (ref 70–99)
POTASSIUM: 4.1 mmol/L (ref 3.5–5.1)
Sodium: 144 mmol/L (ref 135–145)
TOTAL PROTEIN: 7 g/dL (ref 6.5–8.1)

## 2018-05-29 LAB — CBC WITH DIFFERENTIAL/PLATELET
Abs Immature Granulocytes: 0.02 10*3/uL (ref 0.00–0.07)
Basophils Absolute: 0.1 10*3/uL (ref 0.0–0.1)
Basophils Relative: 1 %
EOS PCT: 11 %
Eosinophils Absolute: 0.4 10*3/uL (ref 0.0–0.5)
HEMATOCRIT: 36.7 % — AB (ref 39.0–52.0)
Hemoglobin: 11.8 g/dL — ABNORMAL LOW (ref 13.0–17.0)
Immature Granulocytes: 1 %
LYMPHS PCT: 27 %
Lymphs Abs: 1.1 10*3/uL (ref 0.7–4.0)
MCH: 32.2 pg (ref 26.0–34.0)
MCHC: 32.2 g/dL (ref 30.0–36.0)
MCV: 100 fL (ref 80.0–100.0)
MONO ABS: 0.4 10*3/uL (ref 0.1–1.0)
MONOS PCT: 11 %
NEUTROS ABS: 1.9 10*3/uL (ref 1.7–7.7)
Neutrophils Relative %: 49 %
Platelets: 211 10*3/uL (ref 150–400)
RBC: 3.67 MIL/uL — AB (ref 4.22–5.81)
RDW: 12.9 % (ref 11.5–15.5)
WBC: 3.9 10*3/uL — AB (ref 4.0–10.5)
nRBC: 0 % (ref 0.0–0.2)

## 2018-05-29 LAB — LACTATE DEHYDROGENASE: LDH: 198 U/L — AB (ref 98–192)

## 2018-05-30 ENCOUNTER — Encounter (HOSPITAL_COMMUNITY): Payer: Self-pay

## 2018-05-30 ENCOUNTER — Other Ambulatory Visit: Payer: Medicare Other

## 2018-05-30 ENCOUNTER — Ambulatory Visit (HOSPITAL_COMMUNITY)
Admission: RE | Admit: 2018-05-30 | Discharge: 2018-05-30 | Disposition: A | Discharge status: Home / Self Care | Payer: Medicare Other | Source: Ambulatory Visit | Attending: Hematology | Admitting: Hematology

## 2018-05-30 DIAGNOSIS — N281 Cyst of kidney, acquired: Secondary | ICD-10-CM | POA: Insufficient documentation

## 2018-05-30 DIAGNOSIS — C8233 Follicular lymphoma grade IIIa, intra-abdominal lymph nodes: Secondary | ICD-10-CM | POA: Insufficient documentation

## 2018-05-30 DIAGNOSIS — I700 Atherosclerosis of aorta: Secondary | ICD-10-CM | POA: Diagnosis not present

## 2018-05-30 DIAGNOSIS — C8593 Non-Hodgkin lymphoma, unspecified, intra-abdominal lymph nodes: Secondary | ICD-10-CM | POA: Diagnosis not present

## 2018-05-30 DIAGNOSIS — C8592 Non-Hodgkin lymphoma, unspecified, intrathoracic lymph nodes: Secondary | ICD-10-CM | POA: Diagnosis not present

## 2018-06-01 NOTE — Progress Notes (Signed)
HEMATOLOGY/ONCOLOGY CLINIC NOTE  Date of Service: 06/02/2018  Patient Care Team: [name], MD as PCP - General (Family Medicine)  CHIEF COMPLAINTS/PURPOSE OF CONSULTATION:  F/u for continued management of small bowel follicular lymphoma  HISTORY OF PRESENTING ILLNESS:   Henry Nichols is a wonderful 84 y.o. male who has been referred to [place] by Dr [name], MD for evaluation and management of a small bowel follicular lymphoma.   He presented to ED for abdominal pain and had a CT chest/abd/pelvis on 06/09/2017 with results showing: IMPRESSION: 1. Bilateral pulmonary parenchymal opacities, peribronchovascular in distribution. Favor infection or (especially given the clinical history of vomiting) aspiration. Neoplastic processes are felt unlikely but cannot be entirely excluded. Especially if the patient is diagnosed with lymphoma, consider CT followup after therapy to confirm resolution. 2. No thoracic adenopathy. 3. Mild cardiomegaly. Coronary artery atherosclerosis. Aortic Atherosclerosis (ICD10-I70.0). 4. Development of small bowel dilatation, suspicious for obstruction at the site of distal small bowel thickening on prior exam. 5. Borderline ascending aortic aneurysm. Recommend annual imaging followup by CTA or MRA. This recommendation follows 2010 ACCF/AHA/AATS/ACR/ASA/SCA/SCAI/SIR/STS/SVM Guidelines for the Diagnosis and Management of Patients with Thoracic Aortic Disease. Circulation. 2010; 121: Q119-E174. 6. Nonspecific left upper lobe pulmonary nodule.   He is accompanied by his son today. He states he is doing well overall. He reports noticing a difficulty swallowing and nausea/vomiting after eating. He saw his PCP for this and had a CT scan. He was then referred to Dr. [name] and underwent a laparoscopic small bowel resection with resolution of symptoms.  He states he wasn't losing much weight before these symptoms began and lost 15 pounds after.   Pathology showed  follicular lymphoma   On review of systems, pt reports vomiting, constipation, weight loss, swelling and skin rash to the right ankle, and denies SOB, cough, bloody stools, diarrhea, fever, chills, night sweats and any other accompanying symptoms.   [place] is here for a scheduled follow-up of his Grade 3A Follicular Lymphoma. The patient's last visit with [place] was on 02/10/18. The pt reports that he is doing well overall.   The pt reports that he has had good energy levels in the interim and has been staying active and has been eating well. The pt notes that he has not developed any constitutional symptoms in the interim or concerns for infections. The pt notes that he has been moving his bowels well also. He denies any new abdominal symptoms and denies noticing any new lumps or bumps.   Of note since the patient's last visit, pt has had a CT C/A/P completed on 05/30/18 with results revealing The central mesenteric lymph nodes identified on previous PET studies as hypermetabolic have progressed since the PET-CT of 11/08/2017. No evidence for lymphadenopathy in the chest or pelvis. 2. Left renal cyst. 3.  Aortic Atherosclerois.  Lab results today (06/02/18) of CBC w/diff, CMP, and Reticulocytes is as follows: all values are WNL except for WBC at 3.9k, RBC at 3.67, HGB at 11.8, HCT at 36.7, Glucose at 105, Total Bilirubin at 1.6, GFR at 54. 05/29/18 LDH at 198  On review of systems, pt reports moving his bowels well, good energy levels, staying active, eating well, stable weight, and denies abdominal pains, fevers, chills, night sweats, concerns for infections, noticing any new lumps or bumps, problems passing urine, abdominal pains, and any other symptoms.   MEDICAL HISTORY:  Past Medical History:  Diagnosis Date  . Coronary artery calcification  08/03/2017  . Essential hypertension 08/03/2017  . GERD (gastroesophageal reflux disease)   . HOH (hard of hearing)   .  Hyperlipidemia 08/03/2017  . Hypertension   . LBBB (left bundle branch block) 08/03/2017  . Non-Hodgkin lymphoma ([place]) 08/03/2017  . Pneumonia    hx  . Small bowel mass   . Varicose vein of leg 08/03/2017    SURGICAL HISTORY: Past Surgical History:  Procedure Laterality Date  . EYE SURGERY Bilateral 2016   cataracts  . HERNIA REPAIR Bilateral 2008   inguinal  . LAPAROSCOPIC SMALL BOWEL RESECTION  07/06/2017  . LAPAROSCOPIC SMALL BOWEL RESECTION N/A 07/06/2017   Procedure: LAPAROSCOPIC ASSISTED SMALL BOWEL RESECTION;  Surgeon: Coralie Keens, MD;  Location: [facility];  Service: General;  Laterality: N/A;    SOCIAL HISTORY: Social History   Socioeconomic History  . Marital status: Widowed    Spouse name: Not on file  . Number of children: Not on file  . Years of education: Not on file  . Highest education level: Not on file  Occupational History  . Not on file  Social Needs  . Financial resource strain: Not on file  . Food insecurity:    Worry: Not on file    Inability: Not on file  . Transportation needs:    Medical: Not on file    Non-medical: Not on file  Tobacco Use  . Smoking status: Former Smoker    Types: Cigars    Last attempt to quit: 06/29/1984    Years since quitting: 33.9  . Smokeless tobacco: Never Used  Substance and Sexual Activity  . Alcohol use: No    Frequency: Never  . Drug use: No  . Sexual activity: Not on file  Lifestyle  . Physical activity:    Days per week: Not on file    Minutes per session: Not on file  . Stress: Not on file  Relationships  . Social connections:    Talks on phone: Not on file    Gets together: Not on file    Attends religious service: Not on file    Active member of club or organization: Not on file    Attends meetings of clubs or organizations: Not on file    Relationship status: Not on file  . Intimate partner violence:    Fear of current or ex partner: Not on file    Emotionally abused: Not on file     Physically abused: Not on file    Forced sexual activity: Not on file  Other Topics Concern  . Not on file  Social History Narrative  . Not on file    FAMILY HISTORY: Family History  Problem Relation Age of Onset  . Alzheimer's disease Mother   . Heart disease Mother   . Heart attack Father     ALLERGIES:  has No Known Allergies.  MEDICATIONS:  Current Outpatient Medications  Medication Sig Dispense Refill  . acetaminophen (TYLENOL) 325 MG tablet Take 2 tablets (650 mg total) by mouth every 6 (six) hours as needed for mild pain or moderate pain.    Marland Kitchen albuterol (PROVENTIL HFA;VENTOLIN HFA) 108 (90 Base) MCG/ACT inhaler Inhale 1 puff every 6 (six) hours as needed into the lungs for wheezing or shortness of breath.    Marland Kitchen aspirin EC 81 MG tablet Take 81 mg daily by mouth.    Marland Kitchen atenolol-chlorthalidone (TENORETIC) 100-25 MG tablet Take 0.5 tablets daily by mouth.  1  . Multiple Vitamins-Minerals (PRESERVISION AREDS 2  PO) Take 1 capsule 2 (two) times daily by mouth.    . pantoprazole (PROTONIX) 40 MG tablet Take 40 mg daily by mouth.  3  . potassium chloride SA (K-DUR,KLOR-CON) 20 MEQ tablet Take 40 mEq daily by mouth.    . simvastatin (ZOCOR) 40 MG tablet Take 1 tablet (40 mg total) by mouth every evening. 30 tablet 5  . triamcinolone cream (KENALOG) 0.1 % Apply 1 application daily as needed topically (for dry skin).     No current facility-administered medications for this visit.     REVIEW OF SYSTEMS:    A 10+ POINT REVIEW OF SYSTEMS WAS OBTAINED including neurology, dermatology, psychiatry, cardiac, respiratory, lymph, extremities, GI, GU, Musculoskeletal, constitutional, breasts, reproductive, HEENT.  All pertinent positives are noted in the HPI.  All others are negative.   PHYSICAL EXAMINATION:  ECOG PERFORMANCE STATUS: 1 - Symptomatic but completely ambulatory  .BP (!) 157/69 (BP Location: Left Arm, Patient Position: Sitting) Comment: MD aware  Pulse 100   Temp 98 F (36.7  C) (Oral)   Resp 18   Ht 5\' 10"  (1.778 m)   Wt 159 lb 4.8 oz (72.3 kg)   SpO2 100%   BMI 22.86 kg/m   GENERAL:alert, in no acute distress and comfortable SKIN: no acute rashes, no significant lesions EYES: conjunctiva are pink and non-injected, sclera anicteric OROPHARYNX: MMM, no exudates, no oropharyngeal erythema or ulceration NECK: supple, no JVD LYMPH:  no palpable lymphadenopathy in the cervical, axillary or inguinal regions LUNGS: clear to auscultation b/l with normal respiratory effort HEART: regular rate & rhythm ABDOMEN:  normoactive bowel sounds , non tender, not distended. No palpable hepatosplenomegaly.  Extremity: no pedal edema PSYCH: alert & oriented x 3 with fluent speech NEURO: no focal motor/sensory deficits   LABORATORY DATA:  I have reviewed the data as listed . CBC Latest Ref Rng & Units 05/29/2018 02/10/2018 11/11/2017  WBC 4.0 - 10.5 K/uL 3.9(L) 4.4 4.2  Hemoglobin 13.0 - 17.0 g/dL 11.8(L) 12.6(L) 11.9(L)  Hematocrit 39.0 - 52.0 % 36.7(L) 38.7 36.5(L)  Platelets 150 - 400 K/uL 211 196 213  HGB 11.9  CMP Latest Ref Rng & Units 05/29/2018 02/10/2018 11/11/2017  Glucose 70 - 99 mg/dL 105(H) 101(H) 100  BUN 8 - 23 mg/dL 14 22 20   Creatinine 0.61 - 1.24 mg/dL 1.20 1.38(H) 1.31(H)  Sodium 135 - 145 mmol/L 144 144 140  Potassium 3.5 - 5.1 mmol/L 4.1 3.9 4.2  Chloride 98 - 111 mmol/L 106 105 104  CO2 22 - 32 mmol/L 31 31 29   Calcium 8.9 - 10.3 mg/dL 9.5 9.4 9.7  Total Protein 6.5 - 8.1 g/dL 7.0 7.3 6.9  Total Bilirubin 0.3 - 1.2 mg/dL 1.6(H) 1.9(H) 1.6(H)  Alkaline Phos 38 - 126 U/L 58 59 62  AST 15 - 41 U/L 24 26 20   ALT 0 - 44 U/L 18 17 11    . Lab Results  Component Value Date   LDH 198 (H) 05/29/2018        CT CHEST ABD 06/14/2017 IMPRESSION: 1. Bilateral pulmonary parenchymal opacities, peribronchovascular in distribution. Favor infection or (especially given the clinical history of vomiting) aspiration. Neoplastic processes are  felt unlikely but cannot be entirely excluded. Especially if the patient is diagnosed with lymphoma, consider CT followup after therapy to confirm resolution. 2. No thoracic adenopathy. 3. Mild cardiomegaly. Coronary artery atherosclerosis. Aortic Atherosclerosis (ICD10-I70.0). 4. Development of small bowel dilatation, suspicious for obstruction at the site of distal small bowel thickening on prior exam.  5. Borderline ascending aortic aneurysm. Recommend annual imaging followup by CTA or MRA. This recommendation follows 2010 ACCF/AHA/AATS/ACR/ASA/SCA/SCAI/SIR/STS/SVM Guidelines for the Diagnosis and Management of Patients with Thoracic Aortic Disease. Circulation. 2010; 121: F093-A355. 6. Nonspecific left upper lobe pulmonary nodule.   RADIOGRAPHIC STUDIES: I have personally reviewed the radiological images as listed and agreed with the findings in the report. Ct Abdomen Pelvis Wo Contrast  Result Date: 05/30/2018 CLINICAL DATA:  Lymphoma EXAM: CT CHEST, ABDOMEN AND PELVIS WITHOUT CONTRAST TECHNIQUE: Multidetector CT imaging of the chest, abdomen and pelvis was performed following the standard protocol without IV contrast. COMPARISON:  PET-CT 11/08/2017 FINDINGS: CT CHEST FINDINGS Cardiovascular: The heart size is normal. No substantial pericardial effusion. Coronary artery calcification is evident. Atherosclerotic calcification is noted in the wall of the thoracic aorta. Mediastinum/Nodes: No mediastinal lymphadenopathy. No evidence for gross hilar lymphadenopathy although assessment is limited by the lack of intravenous contrast on today's study. The esophagus has normal imaging features. There is no axillary lymphadenopathy. Lungs/Pleura: The central tracheobronchial airways are patent. No suspicious pulmonary nodule or mass. No focal airspace consolidation. No edema or pleural effusion. Musculoskeletal: No worrisome lytic or sclerotic osseous abnormality. CT ABDOMEN PELVIS FINDINGS  Hepatobiliary: No focal abnormality in the liver on this study without intravenous contrast. Tiny calcified gallstones evident. No intrahepatic or extrahepatic biliary dilation. Pancreas: No focal mass lesion. No dilatation of the main duct. No intraparenchymal cyst. No peripancreatic edema. Spleen: No splenomegaly. No focal mass lesion. Adrenals/Urinary Tract: No adrenal nodule or mass. Right kidney unremarkable. 3.7 cm exophytic water density lesion interpolar left kidney is compatible with a cyst. No evidence for hydroureter. The urinary bladder appears normal for the degree of distention. Stomach/Bowel: Stomach is nondistended. No gastric wall thickening. No evidence of outlet obstruction. Duodenum is normally positioned as is the ligament of Treitz. Duodenal diverticuli noted. No small bowel wall thickening. No small bowel dilatation. The terminal ileum is normal. The appendix is normal. No gross colonic mass. No colonic wall thickening. No substantial diverticular change. Vascular/Lymphatic: There is abdominal aortic atherosclerosis without aneurysm. The central mesenteric lymph nodes followed on previous PET-CT have progressed since 11/08/2017. 18 x 14 mm node (78/2) on today's exam was 11 x 10 mm previously. The second node measures 2.5 x 1.9 cm today compared to 1.1 x 0.8 cm when I remeasure in a similar fashion on the prior study. No pelvic sidewall lymphadenopathy. Reproductive: The prostate gland and seminal vesicles have normal imaging features. Other: No intraperitoneal free fluid. Musculoskeletal: Small bilateral groin hernias contain only fat. No worrisome lytic or sclerotic osseous abnormality. IMPRESSION: 1. The central mesenteric lymph nodes identified on previous PET studies as hypermetabolic have progressed since the PET-CT of 11/08/2017. No evidence for lymphadenopathy in the chest or pelvis. 2. Left renal cyst. 3.  Aortic Atherosclerois (ICD10-170.0) Electronically Signed   By: Misty Stanley  M.D.   On: 05/30/2018 13:34   Ct Chest Wo Contrast  Result Date: 05/30/2018 CLINICAL DATA:  Lymphoma EXAM: CT CHEST, ABDOMEN AND PELVIS WITHOUT CONTRAST TECHNIQUE: Multidetector CT imaging of the chest, abdomen and pelvis was performed following the standard protocol without IV contrast. COMPARISON:  PET-CT 11/08/2017 FINDINGS: CT CHEST FINDINGS Cardiovascular: The heart size is normal. No substantial pericardial effusion. Coronary artery calcification is evident. Atherosclerotic calcification is noted in the wall of the thoracic aorta. Mediastinum/Nodes: No mediastinal lymphadenopathy. No evidence for gross hilar lymphadenopathy although assessment is limited by the lack of intravenous contrast on today's study. The esophagus has normal imaging  features. There is no axillary lymphadenopathy. Lungs/Pleura: The central tracheobronchial airways are patent. No suspicious pulmonary nodule or mass. No focal airspace consolidation. No edema or pleural effusion. Musculoskeletal: No worrisome lytic or sclerotic osseous abnormality. CT ABDOMEN PELVIS FINDINGS Hepatobiliary: No focal abnormality in the liver on this study without intravenous contrast. Tiny calcified gallstones evident. No intrahepatic or extrahepatic biliary dilation. Pancreas: No focal mass lesion. No dilatation of the main duct. No intraparenchymal cyst. No peripancreatic edema. Spleen: No splenomegaly. No focal mass lesion. Adrenals/Urinary Tract: No adrenal nodule or mass. Right kidney unremarkable. 3.7 cm exophytic water density lesion interpolar left kidney is compatible with a cyst. No evidence for hydroureter. The urinary bladder appears normal for the degree of distention. Stomach/Bowel: Stomach is nondistended. No gastric wall thickening. No evidence of outlet obstruction. Duodenum is normally positioned as is the ligament of Treitz. Duodenal diverticuli noted. No small bowel wall thickening. No small bowel dilatation. The terminal ileum is  normal. The appendix is normal. No gross colonic mass. No colonic wall thickening. No substantial diverticular change. Vascular/Lymphatic: There is abdominal aortic atherosclerosis without aneurysm. The central mesenteric lymph nodes followed on previous PET-CT have progressed since 11/08/2017. 18 x 14 mm node (78/2) on today's exam was 11 x 10 mm previously. The second node measures 2.5 x 1.9 cm today compared to 1.1 x 0.8 cm when I remeasure in a similar fashion on the prior study. No pelvic sidewall lymphadenopathy. Reproductive: The prostate gland and seminal vesicles have normal imaging features. Other: No intraperitoneal free fluid. Musculoskeletal: Small bilateral groin hernias contain only fat. No worrisome lytic or sclerotic osseous abnormality. IMPRESSION: 1. The central mesenteric lymph nodes identified on previous PET studies as hypermetabolic have progressed since the PET-CT of 11/08/2017. No evidence for lymphadenopathy in the chest or pelvis. 2. Left renal cyst. 3.  Aortic Atherosclerois (ICD10-170.0) Electronically Signed   By: Misty Stanley M.D.   On: 05/30/2018 13:34    ASSESSMENT & PLAN:   Henry Nichols is a wonderful 84 y.o. 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 No type B symptoms S/p Rituxan weekly x 4 doses Rpt PET/CT 11/08/2017 showed Much improved mesenteric mass as described above. Two small mildly hypermetabolic lymph nodes remain. No other areas of adenopathy are identified in the neck, chest, abdomen or pelvis . PLAN -Discussed pt labwork from 05/29/18; blood counts and chemistries are stable  -Discussed the 05/30/18 CT C/A/P which revealed he central mesenteric lymph nodes identified on previous PET studies as hypermetabolic have progressed since the PET-CT of 11/08/2017. No evidence for lymphadenopathy in the chest or pelvis. 2. Left renal cyst. 3.  Aortic Atherosclerois. -Discussed the  option to pursue maintenance Rituxan vs waiting and watching for symptoms and initiating treatment with symptoms. The pt prefers a wait and watch approach at this time.  -The pt will look for constitutional symptoms or new abdominal symptoms and will let me know if he develops any new concerns  -Will repeat scan in 6 months  -Will see the pt back in 3 months, sooner if any new concerns    RTC with Dr Irene Limbo with labs in 3 months    All of the patients questions were answered with apparent satisfaction. The patient knows to call the clinic with any problems, questions or concerns.  The total time spent in the appt was 20 minutes and more than 50% was on counseling and direct patient cares.  Sullivan Lone MD MS AAHIVMS [facility] Hematology/Oncology Physician [facility]  (Office):       [phone] (Work cell):  [phone] (Fax):           [phone]  I, Baldwin Jamaica, am acting as a scribe for Dr. Irene Limbo  .I have reviewed the above documentation for accuracy and completeness, and I agree with the above. Brunetta Genera MD

## 2018-06-02 ENCOUNTER — Telehealth: Payer: Self-pay | Admitting: Hematology

## 2018-06-02 ENCOUNTER — Inpatient Hospital Stay (HOSPITAL_BASED_OUTPATIENT_CLINIC_OR_DEPARTMENT_OTHER): Payer: Medicare Other | Admitting: Hematology

## 2018-06-02 VITALS — BP 157/69 | HR 100 | Temp 98.0°F | Resp 18 | Ht 70.0 in | Wt 159.3 lb

## 2018-06-02 DIAGNOSIS — I10 Essential (primary) hypertension: Secondary | ICD-10-CM

## 2018-06-02 DIAGNOSIS — Z79899 Other long term (current) drug therapy: Secondary | ICD-10-CM

## 2018-06-02 DIAGNOSIS — Z7982 Long term (current) use of aspirin: Secondary | ICD-10-CM

## 2018-06-02 DIAGNOSIS — F17290 Nicotine dependence, other tobacco product, uncomplicated: Secondary | ICD-10-CM

## 2018-06-02 DIAGNOSIS — C8233 Follicular lymphoma grade IIIa, intra-abdominal lymph nodes: Secondary | ICD-10-CM

## 2018-06-02 DIAGNOSIS — Z9221 Personal history of antineoplastic chemotherapy: Secondary | ICD-10-CM | POA: Diagnosis not present

## 2018-06-02 DIAGNOSIS — N281 Cyst of kidney, acquired: Secondary | ICD-10-CM

## 2018-06-02 NOTE — Telephone Encounter (Signed)
Scheduled appt per 10/18 los - gave patient AVS and calender per los.  

## 2018-06-21 DIAGNOSIS — I10 Essential (primary) hypertension: Secondary | ICD-10-CM | POA: Diagnosis not present

## 2018-06-21 DIAGNOSIS — I872 Venous insufficiency (chronic) (peripheral): Secondary | ICD-10-CM | POA: Diagnosis not present

## 2018-06-21 DIAGNOSIS — Z23 Encounter for immunization: Secondary | ICD-10-CM | POA: Diagnosis not present

## 2018-06-21 DIAGNOSIS — J309 Allergic rhinitis, unspecified: Secondary | ICD-10-CM | POA: Diagnosis not present

## 2018-06-21 DIAGNOSIS — E7800 Pure hypercholesterolemia, unspecified: Secondary | ICD-10-CM | POA: Diagnosis not present

## 2018-06-21 DIAGNOSIS — J449 Chronic obstructive pulmonary disease, unspecified: Secondary | ICD-10-CM | POA: Diagnosis not present

## 2018-06-21 DIAGNOSIS — K219 Gastro-esophageal reflux disease without esophagitis: Secondary | ICD-10-CM | POA: Diagnosis not present

## 2018-06-21 DIAGNOSIS — R600 Localized edema: Secondary | ICD-10-CM | POA: Diagnosis not present

## 2018-06-21 DIAGNOSIS — C8290 Follicular lymphoma, unspecified, unspecified site: Secondary | ICD-10-CM | POA: Diagnosis not present

## 2018-08-29 NOTE — Progress Notes (Signed)
HEMATOLOGY/ONCOLOGY CLINIC NOTE  Date of Service: 08/30/2018  Patient Care Team: [name], MD as PCP - General (Family Medicine)  CHIEF COMPLAINTS/PURPOSE OF CONSULTATION:  F/u for continued management of small bowel follicular lymphoma  HISTORY OF PRESENTING ILLNESS:   Henry Nichols is a wonderful 85 y.o. male who has been referred to [place] by Dr [name], MD for evaluation and management of a small bowel follicular lymphoma.   He presented to ED for abdominal pain and had a CT chest/abd/pelvis on 06/09/2017 with results showing: IMPRESSION: 1. Bilateral pulmonary parenchymal opacities, peribronchovascular in distribution. Favor infection or (especially given the clinical history of vomiting) aspiration. Neoplastic processes are felt unlikely but cannot be entirely excluded. Especially if the patient is diagnosed with lymphoma, consider CT followup after therapy to confirm resolution. 2. No thoracic adenopathy. 3. Mild cardiomegaly. Coronary artery atherosclerosis. Aortic Atherosclerosis (ICD10-I70.0). 4. Development of small bowel dilatation, suspicious for obstruction at the site of distal small bowel thickening on prior exam. 5. Borderline ascending aortic aneurysm. Recommend annual imaging followup by CTA or MRA. This recommendation follows 2010 ACCF/AHA/AATS/ACR/ASA/SCA/SCAI/SIR/STS/SVM Guidelines for the Diagnosis and Management of Patients with Thoracic Aortic Disease. Circulation. 2010; 121: I778-E423. 6. Nonspecific left upper lobe pulmonary nodule.   He is accompanied by his son today. He states he is doing well overall. He reports noticing a difficulty swallowing and nausea/vomiting after eating. He saw his PCP for this and had a CT scan. He was then referred to Dr. [name] and underwent a laparoscopic small bowel resection with resolution of symptoms.  He states he wasn't losing much weight before these symptoms began and lost 15 pounds after.   Pathology showed  follicular lymphoma   On review of systems, pt reports vomiting, constipation, weight loss, swelling and skin rash to the right ankle, and denies SOB, cough, bloody stools, diarrhea, fever, chills, night sweats and any other accompanying symptoms.   Henry Nichols for a scheduled follow-up of his Grade 3A Follicular Lymphoma. The patient's last visit with [place] was on 06/02/18. The pt reports that he is doing well overall.   The pt reports that he has not developed any new concerns in the interim. He has enjoyed good energy levels and has been eating well. He denies any fevers, chills, night sweats, or unexpected weight loss. He denies any abdominal pains, has been moving his bowels. He notes he could do a better job staying well hydrated.  The pt notes that he has been wearing compression socks and his ankle swelling on the right is improved.   Lab results today (08/30/18) of CBC w/diff and CMP is as follows: all values are WNL except for RBC at 3.56, HGB at 11.3, HCT at 35.4, Creatinine at 1.33, Total Bilirubin at 1.5, GFR at 49. 08/30/18 LDH is at 168  On review of systems, pt reports moving his bowels well, breathing well, eating well, weight gain, improving right ankle swelling, and denies fevers, chills, night sweats, unexpected weight loss, abdominal pains, noticing any new lumps or bumps, left ankle swelling, and any other symptoms.   MEDICAL HISTORY:  Past Medical History:  Diagnosis Date  . Coronary artery calcification 08/03/2017  . Essential hypertension 08/03/2017  . GERD (gastroesophageal reflux disease)   . HOH (hard of hearing)   . Hyperlipidemia 08/03/2017  . Hypertension   . LBBB (left bundle branch block) 08/03/2017  . Non-Hodgkin lymphoma ([place]) 08/03/2017  . Pneumonia    hx  .  Small bowel mass   . Varicose vein of leg 08/03/2017    SURGICAL HISTORY: Past Surgical History:  Procedure Laterality Date  . EYE SURGERY Bilateral 2016   cataracts  .  HERNIA REPAIR Bilateral 2008   inguinal  . LAPAROSCOPIC SMALL BOWEL RESECTION  07/06/2017  . LAPAROSCOPIC SMALL BOWEL RESECTION N/A 07/06/2017   Procedure: LAPAROSCOPIC ASSISTED SMALL BOWEL RESECTION;  Surgeon: Coralie Keens, MD;  Location: [facility];  Service: General;  Laterality: N/A;    SOCIAL HISTORY: Social History   Socioeconomic History  . Marital status: Widowed    Spouse name: Not on file  . Number of children: Not on file  . Years of education: Not on file  . Highest education level: Not on file  Occupational History  . Not on file  Social Needs  . Financial resource strain: Not on file  . Food insecurity:    Worry: Not on file    Inability: Not on file  . Transportation needs:    Medical: Not on file    Non-medical: Not on file  Tobacco Use  . Smoking status: Former Smoker    Types: Cigars    Last attempt to quit: 06/29/1984    Years since quitting: 34.1  . Smokeless tobacco: Never Used  Substance and Sexual Activity  . Alcohol use: No    Frequency: Never  . Drug use: No  . Sexual activity: Not on file  Lifestyle  . Physical activity:    Days per week: Not on file    Minutes per session: Not on file  . Stress: Not on file  Relationships  . Social connections:    Talks on phone: Not on file    Gets together: Not on file    Attends religious service: Not on file    Active member of club or organization: Not on file    Attends meetings of clubs or organizations: Not on file    Relationship status: Not on file  . Intimate partner violence:    Fear of current or ex partner: Not on file    Emotionally abused: Not on file    Physically abused: Not on file    Forced sexual activity: Not on file  Other Topics Concern  . Not on file  Social History Narrative  . Not on file    FAMILY HISTORY: Family History  Problem Relation Age of Onset  . Alzheimer's disease Mother   . Heart disease Mother   . Heart attack Father     ALLERGIES:  has No Known  Allergies.  MEDICATIONS:  Current Outpatient Medications  Medication Sig Dispense Refill  . acetaminophen (TYLENOL) 325 MG tablet Take 2 tablets (650 mg total) by mouth every 6 (six) hours as needed for mild pain or moderate pain.    Henry Kitchen albuterol (PROVENTIL HFA;VENTOLIN HFA) 108 (90 Base) MCG/ACT inhaler Inhale 1 puff every 6 (six) hours as needed into the lungs for wheezing or shortness of breath.    Henry Kitchen aspirin EC 81 MG tablet Take 81 mg daily by mouth.    Henry Kitchen atenolol-chlorthalidone (TENORETIC) 100-25 MG tablet Take 0.5 tablets daily by mouth.  1  . Multiple Vitamins-Minerals (PRESERVISION AREDS 2 PO) Take 1 capsule 2 (two) times daily by mouth.    . pantoprazole (PROTONIX) 40 MG tablet Take 40 mg daily by mouth.  3  . potassium chloride SA (K-DUR,KLOR-CON) 20 MEQ tablet Take 40 mEq daily by mouth.    . simvastatin (ZOCOR) 40 MG tablet  Take 1 tablet (40 mg total) by mouth every evening. 30 tablet 5  . triamcinolone cream (KENALOG) 0.1 % Apply 1 application daily as needed topically (for dry skin).     No current facility-administered medications for this visit.     REVIEW OF SYSTEMS:    A 10+ POINT REVIEW OF SYSTEMS WAS OBTAINED including neurology, dermatology, psychiatry, cardiac, respiratory, lymph, extremities, GI, GU, Musculoskeletal, constitutional, breasts, reproductive, HEENT.  All pertinent positives are noted in the HPI.  All others are negative.   PHYSICAL EXAMINATION:  ECOG PERFORMANCE STATUS: 1 - Symptomatic but completely ambulatory  .BP (!) 145/97 (BP Location: Left Arm, Patient Position: Sitting) Comment: Notified Nurse of BP  Pulse (!) 58   Temp 97.8 F (36.6 C) (Oral)   Resp 18   Ht 5\' 10"  (1.778 m)   Wt 160 lb (72.6 kg)   SpO2 97%   BMI 22.96 kg/m   GENERAL:alert, in no acute distress and comfortable SKIN: no acute rashes, no significant lesions EYES: conjunctiva are pink and non-injected, sclera anicteric OROPHARYNX: MMM, no exudates, no oropharyngeal erythema  or ulceration NECK: supple, no JVD LYMPH:  no palpable lymphadenopathy in the cervical, axillary or inguinal regions LUNGS: clear to auscultation b/l with normal respiratory effort HEART: regular rate & rhythm ABDOMEN:  normoactive bowel sounds , non tender, not distended. No palpable hepatosplenomegaly.  Extremity: no pedal edema PSYCH: alert & oriented x 3 with fluent speech NEURO: no focal motor/sensory deficits   LABORATORY DATA:  I have reviewed the data as listed . CBC Latest Ref Rng & Units 08/30/2018 05/29/2018 02/10/2018  WBC 4.0 - 10.5 K/uL 5.3 3.9(L) 4.4  Hemoglobin 13.0 - 17.0 g/dL 11.3(L) 11.8(L) 12.6(L)  Hematocrit 39.0 - 52.0 % 35.4(L) 36.7(L) 38.7  Platelets 150 - 400 K/uL 207 211 196  HGB 11.9  CMP Latest Ref Rng & Units 08/30/2018 05/29/2018 02/10/2018  Glucose 70 - 99 mg/dL 99 105(H) 101(H)  BUN 8 - 23 mg/dL 17 14 22   Creatinine 0.61 - 1.24 mg/dL 1.33(H) 1.20 1.38(H)  Sodium 135 - 145 mmol/L 142 144 144  Potassium 3.5 - 5.1 mmol/L 4.4 4.1 3.9  Chloride 98 - 111 mmol/L 104 106 105  CO2 22 - 32 mmol/L 30 31 31   Calcium 8.9 - 10.3 mg/dL 9.4 9.5 9.4  Total Protein 6.5 - 8.1 g/dL 6.8 7.0 7.3  Total Bilirubin 0.3 - 1.2 mg/dL 1.5(H) 1.6(H) 1.9(H)  Alkaline Phos 38 - 126 U/L 73 58 59  AST 15 - 41 U/L 20 24 26   ALT 0 - 44 U/L 15 18 17    . Lab Results  Component Value Date   LDH 168 08/30/2018        CT CHEST ABD 06/14/2017 IMPRESSION: 1. Bilateral pulmonary parenchymal opacities, peribronchovascular in distribution. Favor infection or (especially given the clinical history of vomiting) aspiration. Neoplastic processes are felt unlikely but cannot be entirely excluded. Especially if the patient is diagnosed with lymphoma, consider CT followup after therapy to confirm resolution. 2. No thoracic adenopathy. 3. Mild cardiomegaly. Coronary artery atherosclerosis. Aortic Atherosclerosis (ICD10-I70.0). 4. Development of small bowel dilatation, suspicious for  obstruction at the site of distal small bowel thickening on prior exam. 5. Borderline ascending aortic aneurysm. Recommend annual imaging followup by CTA or MRA. This recommendation follows 2010 ACCF/AHA/AATS/ACR/ASA/SCA/SCAI/SIR/STS/SVM Guidelines for the Diagnosis and Management of Patients with Thoracic Aortic Disease. Circulation. 2010; 121: K800-L491. 6. Nonspecific left upper lobe pulmonary nodule.   RADIOGRAPHIC STUDIES: I have personally reviewed the radiological  images as listed and agreed with the findings in the report. No results found.  ASSESSMENT & PLAN:   TENG DECOU is a wonderful 85 y.o. 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 No type B symptoms S/p Rituxan weekly x 4 doses Rpt PET/CT 11/08/2017 showed Much improved mesenteric mass as described above. Two small mildly hypermetabolic lymph nodes remain. No other areas of adenopathy are identified in the neck, chest, abdomen or pelvis  05/30/18 CT C/A/P revealed he central mesenteric lymph nodes identified on previous PET studies as hypermetabolic have progressed since the PET-CT of 11/08/2017. No evidence for lymphadenopathy in the chest or pelvis. 2. Left renal cyst. 3.  Aortic Atherosclerois. Henry Kitchen PLAN -Discussed pt labwork today, 08/30/18; blood counts and chemistries are stable. LDH at 168.  -The pt shows no clinical or lab progression of the patient's follicular lymphoma at this time.  -No indication for further treatment at this time.  -Previously discussed the option to pursue maintenance Rituxan vs waiting and watching for symptoms and initiating treatment with symptoms. The pt prefers a wait and watch approach at this time.  -The pt will continue to look for constitutional symptoms or new abdominal symptoms and will let me know if he develops any new concerns  -Recommended that the pt continue to eat well, drink at least 48-64 oz of  water each day, and walk 20-30 minutes each day.  -Will repeat scan prior to next visit  -Will see the pt back in 4 months   Labs in 16 weeks CT chest/abd/pelvis in 16 weeks RTC with Dr Irene Limbo in 4 months with labs    All of the patients questions were answered with apparent satisfaction. The patient knows to call the clinic with any problems, questions or concerns.  The total time spent in the appt was 25 minutes and more than 50% was on counseling and direct patient cares.    Sullivan Lone MD [place] AAHIVMS [facility] Hematology/Oncology Physician [facility]  (Office):       [phone] (Work cell):  [phone] (Fax):           [phone]  I, Baldwin Jamaica, am acting as a scribe for Dr. Sullivan Lone.   .I have reviewed the above documentation for accuracy and completeness, and I agree with the above. Brunetta Genera MD

## 2018-08-30 ENCOUNTER — Inpatient Hospital Stay: Payer: Medicare Other | Attending: Hematology | Admitting: Hematology

## 2018-08-30 ENCOUNTER — Inpatient Hospital Stay: Payer: Medicare Other

## 2018-08-30 ENCOUNTER — Telehealth: Payer: Self-pay | Admitting: Hematology

## 2018-08-30 VITALS — BP 145/97 | HR 58 | Temp 97.8°F | Resp 18 | Ht 70.0 in | Wt 160.0 lb

## 2018-08-30 DIAGNOSIS — Z87891 Personal history of nicotine dependence: Secondary | ICD-10-CM | POA: Insufficient documentation

## 2018-08-30 DIAGNOSIS — Z7982 Long term (current) use of aspirin: Secondary | ICD-10-CM | POA: Diagnosis not present

## 2018-08-30 DIAGNOSIS — Z9221 Personal history of antineoplastic chemotherapy: Secondary | ICD-10-CM | POA: Insufficient documentation

## 2018-08-30 DIAGNOSIS — C8233 Follicular lymphoma grade IIIa, intra-abdominal lymph nodes: Secondary | ICD-10-CM | POA: Diagnosis not present

## 2018-08-30 DIAGNOSIS — Z79899 Other long term (current) drug therapy: Secondary | ICD-10-CM

## 2018-08-30 LAB — CBC WITH DIFFERENTIAL/PLATELET
Abs Immature Granulocytes: 0.02 10*3/uL (ref 0.00–0.07)
BASOS ABS: 0 10*3/uL (ref 0.0–0.1)
BASOS PCT: 1 %
EOS ABS: 0.5 10*3/uL (ref 0.0–0.5)
Eosinophils Relative: 9 %
HCT: 35.4 % — ABNORMAL LOW (ref 39.0–52.0)
Hemoglobin: 11.3 g/dL — ABNORMAL LOW (ref 13.0–17.0)
IMMATURE GRANULOCYTES: 0 %
Lymphocytes Relative: 23 %
Lymphs Abs: 1.2 10*3/uL (ref 0.7–4.0)
MCH: 31.7 pg (ref 26.0–34.0)
MCHC: 31.9 g/dL (ref 30.0–36.0)
MCV: 99.4 fL (ref 80.0–100.0)
Monocytes Absolute: 0.7 10*3/uL (ref 0.1–1.0)
Monocytes Relative: 13 %
NEUTROS PCT: 54 %
NRBC: 0 % (ref 0.0–0.2)
Neutro Abs: 2.9 10*3/uL (ref 1.7–7.7)
PLATELETS: 207 10*3/uL (ref 150–400)
RBC: 3.56 MIL/uL — ABNORMAL LOW (ref 4.22–5.81)
RDW: 12.8 % (ref 11.5–15.5)
WBC: 5.3 10*3/uL (ref 4.0–10.5)

## 2018-08-30 LAB — CMP (CANCER CENTER ONLY)
ALT: 15 U/L (ref 0–44)
AST: 20 U/L (ref 15–41)
Albumin: 3.6 g/dL (ref 3.5–5.0)
Alkaline Phosphatase: 73 U/L (ref 38–126)
Anion gap: 8 (ref 5–15)
BUN: 17 mg/dL (ref 8–23)
CO2: 30 mmol/L (ref 22–32)
Calcium: 9.4 mg/dL (ref 8.9–10.3)
Chloride: 104 mmol/L (ref 98–111)
Creatinine: 1.33 mg/dL — ABNORMAL HIGH (ref 0.61–1.24)
GFR, EST NON AFRICAN AMERICAN: 49 mL/min — AB (ref >60)
GFR, Est AFR Am: 56 mL/min — ABNORMAL LOW (ref >60)
GLUCOSE: 99 mg/dL (ref 70–99)
POTASSIUM: 4.4 mmol/L (ref 3.5–5.1)
Sodium: 142 mmol/L (ref 135–145)
Total Bilirubin: 1.5 mg/dL — ABNORMAL HIGH (ref 0.3–1.2)
Total Protein: 6.8 g/dL (ref 6.5–8.1)

## 2018-08-30 LAB — LACTATE DEHYDROGENASE: LDH: 168 U/L (ref 98–192)

## 2018-08-30 NOTE — Telephone Encounter (Signed)
Printed calendar and avs. °

## 2018-08-30 NOTE — Patient Instructions (Signed)
Thank you for choosing [facility] to provide your oncology and hematology care.  To afford each patient quality time with our providers, please arrive 30 minutes before your scheduled appointment time.  If you arrive late for your appointment, you may be asked to reschedule.  We strive to give you quality time with our providers, and arriving late affects you and other patients whose appointments are after yours.    If you are a no show for multiple scheduled visits, you may be dismissed from the clinic at the providers discretion.     Again, thank you for choosing [facility], our hope is that these requests will decrease the amount of time that you wait before being seen by our physicians.  ______________________________________________________________________   Should you have questions after your visit to the [facility], please contact our office at [phone] between the hours of 8:30 and 4:30 p.m.    Voicemails left after 4:30p.m will not be returned until the following business day.     For prescription refill requests, please have your pharmacy contact us directly.  Please also try to allow 48 hours for prescription requests.     Please contact the scheduling department for questions regarding scheduling.  For scheduling of procedures such as PET scans, CT scans, MRI, Ultrasound, etc please contact central scheduling at [phone].     Resources For Cancer Patients and Caregivers:    Oncolink.org:  A wonderful resource for patients and healthcare providers for information regarding your disease, ways to tract your treatment, what to expect, etc.      American Cancer Society:  800-227-2345  Can help patients locate various types of support and financial assistance   Cancer Care: 1-800-813-HOPE (4673) Provides financial assistance, online support groups, medication/co-pay assistance.     [place] DSS:  336-641-3447 Where to apply  for food stamps, Medicaid, and utility assistance   Medicare Rights Center: 800-333-4114 Helps people with Medicare understand their rights and benefits, navigate the Medicare system, and secure the quality healthcare they deserve   SCAT: 336-333-6589 [employer]'s shared-ride transportation service for eligible riders who have a disability that prevents them from riding the fixed route bus.     For additional information on assistance programs please contact our social worker:   Abigail Elmore:  [phone]  

## 2018-10-30 ENCOUNTER — Other Ambulatory Visit: Payer: Self-pay

## 2018-10-30 ENCOUNTER — Encounter (INDEPENDENT_AMBULATORY_CARE_PROVIDER_SITE_OTHER): Payer: Medicare Other | Admitting: Ophthalmology

## 2018-10-30 DIAGNOSIS — I10 Essential (primary) hypertension: Secondary | ICD-10-CM | POA: Diagnosis not present

## 2018-10-30 DIAGNOSIS — H43813 Vitreous degeneration, bilateral: Secondary | ICD-10-CM | POA: Diagnosis not present

## 2018-10-30 DIAGNOSIS — H353132 Nonexudative age-related macular degeneration, bilateral, intermediate dry stage: Secondary | ICD-10-CM

## 2018-10-30 DIAGNOSIS — H35033 Hypertensive retinopathy, bilateral: Secondary | ICD-10-CM | POA: Diagnosis not present

## 2018-12-20 ENCOUNTER — Ambulatory Visit (HOSPITAL_COMMUNITY): Admission: RE | Admit: 2018-12-20 | Payer: Medicare Other | Source: Ambulatory Visit

## 2018-12-20 ENCOUNTER — Ambulatory Visit (HOSPITAL_COMMUNITY): Payer: Medicare Other

## 2018-12-20 ENCOUNTER — Other Ambulatory Visit: Payer: Self-pay

## 2018-12-20 ENCOUNTER — Ambulatory Visit (HOSPITAL_COMMUNITY)
Admission: RE | Admit: 2018-12-20 | Discharge: 2018-12-20 | Disposition: A | Discharge status: Home / Self Care | Payer: Medicare Other | Source: Ambulatory Visit | Attending: Hematology | Admitting: Hematology

## 2018-12-20 ENCOUNTER — Inpatient Hospital Stay: Payer: Medicare Other | Attending: Hematology

## 2018-12-20 ENCOUNTER — Encounter (HOSPITAL_COMMUNITY): Payer: Self-pay

## 2018-12-20 DIAGNOSIS — Z9221 Personal history of antineoplastic chemotherapy: Secondary | ICD-10-CM | POA: Diagnosis not present

## 2018-12-20 DIAGNOSIS — C8233 Follicular lymphoma grade IIIa, intra-abdominal lymph nodes: Secondary | ICD-10-CM | POA: Insufficient documentation

## 2018-12-20 DIAGNOSIS — C8590 Non-Hodgkin lymphoma, unspecified, unspecified site: Secondary | ICD-10-CM | POA: Diagnosis not present

## 2018-12-20 LAB — CMP (CANCER CENTER ONLY)
ALT: 18 U/L (ref 0–44)
AST: 23 U/L (ref 15–41)
Albumin: 3.7 g/dL (ref 3.5–5.0)
Alkaline Phosphatase: 75 U/L (ref 38–126)
Anion gap: 8 (ref 5–15)
BUN: 16 mg/dL (ref 8–23)
CO2: 31 mmol/L (ref 22–32)
Calcium: 9.4 mg/dL (ref 8.9–10.3)
Chloride: 104 mmol/L (ref 98–111)
Creatinine: 1.18 mg/dL (ref 0.61–1.24)
GFR, Est AFR Am: >60 mL/min (ref >60)
GFR, Estimated: 56 mL/min — ABNORMAL LOW (ref >60)
Glucose, Bld: 101 mg/dL — ABNORMAL HIGH (ref 70–99)
Potassium: 4 mmol/L (ref 3.5–5.1)
Sodium: 143 mmol/L (ref 135–145)
Total Bilirubin: 1.5 mg/dL — ABNORMAL HIGH (ref 0.3–1.2)
Total Protein: 7 g/dL (ref 6.5–8.1)

## 2018-12-20 LAB — CBC WITH DIFFERENTIAL/PLATELET
Abs Immature Granulocytes: 0.02 10*3/uL (ref 0.00–0.07)
Basophils Absolute: 0.1 10*3/uL (ref 0.0–0.1)
Basophils Relative: 1 %
Eosinophils Absolute: 0.4 10*3/uL (ref 0.0–0.5)
Eosinophils Relative: 9 %
HCT: 38.1 % — ABNORMAL LOW (ref 39.0–52.0)
Hemoglobin: 12.3 g/dL — ABNORMAL LOW (ref 13.0–17.0)
Immature Granulocytes: 0 %
Lymphocytes Relative: 26 %
Lymphs Abs: 1.2 10*3/uL (ref 0.7–4.0)
MCH: 32 pg (ref 26.0–34.0)
MCHC: 32.3 g/dL (ref 30.0–36.0)
MCV: 99.2 fL (ref 80.0–100.0)
Monocytes Absolute: 0.5 10*3/uL (ref 0.1–1.0)
Monocytes Relative: 11 %
Neutro Abs: 2.5 10*3/uL (ref 1.7–7.7)
Neutrophils Relative %: 53 %
Platelets: 187 10*3/uL (ref 150–400)
RBC: 3.84 MIL/uL — ABNORMAL LOW (ref 4.22–5.81)
RDW: 13.1 % (ref 11.5–15.5)
WBC: 4.8 10*3/uL (ref 4.0–10.5)
nRBC: 0 % (ref 0.0–0.2)

## 2018-12-20 LAB — LACTATE DEHYDROGENASE: LDH: 157 U/L (ref 98–192)

## 2018-12-22 DIAGNOSIS — I872 Venous insufficiency (chronic) (peripheral): Secondary | ICD-10-CM | POA: Diagnosis not present

## 2018-12-22 DIAGNOSIS — E7800 Pure hypercholesterolemia, unspecified: Secondary | ICD-10-CM | POA: Diagnosis not present

## 2018-12-22 DIAGNOSIS — R600 Localized edema: Secondary | ICD-10-CM | POA: Diagnosis not present

## 2018-12-22 DIAGNOSIS — J309 Allergic rhinitis, unspecified: Secondary | ICD-10-CM | POA: Diagnosis not present

## 2018-12-22 DIAGNOSIS — J449 Chronic obstructive pulmonary disease, unspecified: Secondary | ICD-10-CM | POA: Diagnosis not present

## 2018-12-22 DIAGNOSIS — C8290 Follicular lymphoma, unspecified, unspecified site: Secondary | ICD-10-CM | POA: Diagnosis not present

## 2018-12-22 DIAGNOSIS — I10 Essential (primary) hypertension: Secondary | ICD-10-CM | POA: Diagnosis not present

## 2018-12-28 NOTE — Progress Notes (Signed)
HEMATOLOGY/ONCOLOGY CLINIC NOTE  Date of Service: 12/29/2018  Patient Care Team: [name], MD as PCP - General (Family Medicine)  CHIEF COMPLAINTS/PURPOSE OF CONSULTATION:  F/u for continued management of small bowel follicular lymphoma  HISTORY OF PRESENTING ILLNESS:   Henry Nichols is a wonderful 85 y.o. male who has been referred to [place] by Dr [name], MD for evaluation and management of a small bowel follicular lymphoma.   He presented to ED for abdominal pain and had a CT chest/abd/pelvis on 06/09/2017 with results showing: IMPRESSION: 1. Bilateral pulmonary parenchymal opacities, peribronchovascular in distribution. Favor infection or (especially given the clinical history of vomiting) aspiration. Neoplastic processes are felt unlikely but cannot be entirely excluded. Especially if the patient is diagnosed with lymphoma, consider CT followup after therapy to confirm resolution. 2. No thoracic adenopathy. 3. Mild cardiomegaly. Coronary artery atherosclerosis. Aortic Atherosclerosis (ICD10-I70.0). 4. Development of small bowel dilatation, suspicious for obstruction at the site of distal small bowel thickening on prior exam. 5. Borderline ascending aortic aneurysm. Recommend annual imaging followup by CTA or MRA. This recommendation follows 2010 ACCF/AHA/AATS/ACR/ASA/SCA/SCAI/SIR/STS/SVM Guidelines for the Diagnosis and Management of Patients with Thoracic Aortic Disease. Circulation. 2010; 121: Z610-R604. 6. Nonspecific left upper lobe pulmonary nodule.   He is accompanied by his son today. He states he is doing well overall. He reports noticing a difficulty swallowing and nausea/vomiting after eating. He saw his PCP for this and had a CT scan. He was then referred to Dr. [name] and underwent a laparoscopic small bowel resection with resolution of symptoms.  He states he wasn't losing much weight before these symptoms began and lost 15 pounds after.   Pathology showed  follicular lymphoma   On review of systems, pt reports vomiting, constipation, weight loss, swelling and skin rash to the right ankle, and denies SOB, cough, bloody stools, diarrhea, fever, chills, night sweats and any other accompanying symptoms.   [name] is here for a scheduled follow-up of his Grade 3A Follicular Lymphoma. The patient's last visit with [place] was on 08/30/18. The pt reports that he is doing well overall.  The pt reports that he has not developed any new concerns in the interim. He notes that he is enjoying good energy levels, is eating well and endorses stable weight. The pt denies any fevers, chills, night sweats, unexpected weight loss, or noticing any new lumps or bumps. He notes that his stools are occasionally more firm, but this resolves after eating more fruits. He denies abdominal pains, blood in the stools or black stools. 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  Of note since the patient's last visit, pt has had CT C/A/P completed on 12/20/18 with results revealing "Previously seen lymph node in central mesenteric fat which had measured 2.5 x 1.9 cm on the most recent CT has increased in size and is now contiguous with a second index lymph node on the prior exam. No new enlarged lymph nodes are identified. No other change compared to the prior study."  Lab results today (12/29/18) of CBC w/diff and CMP is as follows: all values are WNL except for RBC at 3.74, HGB at 11.7, HCT at 37.3, Glucose at 137, Creatinine at 1.33, Total Bilirubin at 1.4, GFR at 49. 12/29/18 LDH is pending  On review of systems, pt reports good energy levels, eating well, stable weight, staying active, moving his bowels well, and denies fevers, chills, night sweats, unexpected  weight loss, new lumps or bumps, abdominal pains, blood in the stools, black stools, mouth sores, pain along the spine, leg swelling, and any other symptoms.   MEDICAL  HISTORY:  Past Medical History:  Diagnosis Date   Coronary artery calcification 08/03/2017   Essential hypertension 08/03/2017   GERD (gastroesophageal reflux disease)    HOH (hard of hearing)    Hyperlipidemia 08/03/2017   Hypertension    LBBB (left bundle branch block) 08/03/2017   Non-Hodgkin lymphoma ([place]) 08/03/2017   Pneumonia    hx   Small bowel mass    Varicose vein of leg 08/03/2017    SURGICAL HISTORY: Past Surgical History:  Procedure Laterality Date   EYE SURGERY Bilateral 2016   cataracts   HERNIA REPAIR Bilateral 2008   inguinal   LAPAROSCOPIC SMALL BOWEL RESECTION  07/06/2017   LAPAROSCOPIC SMALL BOWEL RESECTION N/A 07/06/2017   Procedure: LAPAROSCOPIC ASSISTED SMALL BOWEL RESECTION;  Surgeon: Coralie Keens, MD;  Location: [facility];  Service: General;  Laterality: N/A;    SOCIAL HISTORY: Social History   Socioeconomic History   Marital status: Widowed    Spouse name: Not on file   Number of children: Not on file   Years of education: Not on file   Highest education level: Not on file  Occupational History   Not on file  Social Needs   Financial resource strain: Not on file   Food insecurity:    Worry: Not on file    Inability: Not on file   Transportation needs:    Medical: Not on file    Non-medical: Not on file  Tobacco Use   Smoking status: Former Smoker    Types: Cigars    Last attempt to quit: 06/29/1984    Years since quitting: 34.5   Smokeless tobacco: Never Used  Substance and Sexual Activity   Alcohol use: No    Frequency: Never   Drug use: No   Sexual activity: Not on file  Lifestyle   Physical activity:    Days per week: Not on file    Minutes per session: Not on file   Stress: Not on file  Relationships   Social connections:    Talks on phone: Not on file    Gets together: Not on file    Attends religious service: Not on file    Active member of club or organization: Not on file    Attends  meetings of clubs or organizations: Not on file    Relationship status: Not on file   Intimate partner violence:    Fear of current or ex partner: Not on file    Emotionally abused: Not on file    Physically abused: Not on file    Forced sexual activity: Not on file  Other Topics Concern   Not on file  Social History Narrative   Not on file    FAMILY HISTORY: Family History  Problem Relation Age of Onset   Alzheimer's disease Mother    Heart disease Mother    Heart attack Father     ALLERGIES:  has No Known Allergies.  MEDICATIONS:  Current Outpatient Medications  Medication Sig Dispense Refill   acetaminophen (TYLENOL) 325 MG tablet Take 2 tablets (650 mg total) by mouth every 6 (six) hours as needed for mild pain or moderate pain.     albuterol (PROVENTIL HFA;VENTOLIN HFA) 108 (90 Base) MCG/ACT inhaler Inhale 1 puff every 6 (six) hours as needed into the lungs for wheezing or  shortness of breath.     aspirin EC 81 MG tablet Take 81 mg daily by mouth.     atenolol-chlorthalidone (TENORETIC) 100-25 MG tablet Take 0.5 tablets daily by mouth.  1   Multiple Vitamins-Minerals (PRESERVISION AREDS 2 PO) Take 1 capsule 2 (two) times daily by mouth.     pantoprazole (PROTONIX) 40 MG tablet Take 40 mg daily by mouth.  3   potassium chloride SA (K-DUR,KLOR-CON) 20 MEQ tablet Take 40 mEq daily by mouth.     simvastatin (ZOCOR) 40 MG tablet Take 1 tablet (40 mg total) by mouth every evening. 30 tablet 5   triamcinolone cream (KENALOG) 0.1 % Apply 1 application daily as needed topically (for dry skin).     No current facility-administered medications for this visit.     REVIEW OF SYSTEMS:    A 10+ POINT REVIEW OF SYSTEMS WAS OBTAINED including neurology, dermatology, psychiatry, cardiac, respiratory, lymph, extremities, GI, GU, Musculoskeletal, constitutional, breasts, reproductive, HEENT.  All pertinent positives are noted in the HPI.  All others are negative.   PHYSICAL  EXAMINATION:  ECOG PERFORMANCE STATUS: 1 - Symptomatic but completely ambulatory  .BP 125/60 (BP Location: Left Arm, Patient Position: Sitting)    Pulse (!) 58 Comment: Dr Irene Limbo aware of pulse   Temp 98.7 F (37.1 C) (Oral)    Resp 17    Ht 5\' 10"  (1.778 m)    Wt 158 lb 6.4 oz (71.8 kg)    SpO2 100%    BMI 22.73 kg/m   GENERAL:alert, in no acute distress and comfortable SKIN: no acute rashes, no significant lesions EYES: conjunctiva are pink and non-injected, sclera anicteric OROPHARYNX: MMM, no exudates, no oropharyngeal erythema or ulceration NECK: supple, no JVD LYMPH:  no palpable lymphadenopathy in the cervical, axillary or inguinal regions LUNGS: clear to auscultation b/l with normal respiratory effort HEART: regular rate & rhythm ABDOMEN:  normoactive bowel sounds , non tender, not distended. No palpable hepatosplenomegaly.  Extremity: no pedal edema PSYCH: alert & oriented x 3 with fluent speech NEURO: no focal motor/sensory deficits   LABORATORY DATA:  I have reviewed the data as listed . CBC Latest Ref Rng & Units 12/29/2018 12/20/2018 08/30/2018  WBC 4.0 - 10.5 K/uL 5.2 4.8 5.3  Hemoglobin 13.0 - 17.0 g/dL 11.7(L) 12.3(L) 11.3(L)  Hematocrit 39.0 - 52.0 % 37.3(L) 38.1(L) 35.4(L)  Platelets 150 - 400 K/uL 203 187 207  HGB 11.9  CMP Latest Ref Rng & Units 12/29/2018 12/20/2018 08/30/2018  Glucose 70 - 99 mg/dL 137(H) 101(H) 99  BUN 8 - 23 mg/dL 19 16 17   Creatinine 0.61 - 1.24 mg/dL 1.33(H) 1.18 1.33(H)  Sodium 135 - 145 mmol/L 143 143 142  Potassium 3.5 - 5.1 mmol/L 4.1 4.0 4.4  Chloride 98 - 111 mmol/L 104 104 104  CO2 22 - 32 mmol/L 30 31 30   Calcium 8.9 - 10.3 mg/dL 9.2 9.4 9.4  Total Protein 6.5 - 8.1 g/dL 7.1 7.0 6.8  Total Bilirubin 0.3 - 1.2 mg/dL 1.4(H) 1.5(H) 1.5(H)  Alkaline Phos 38 - 126 U/L 80 75 73  AST 15 - 41 U/L 20 23 20   ALT 0 - 44 U/L 15 18 15    . Lab Results  Component Value Date   LDH 157 12/20/2018        CT CHEST ABD  06/14/2017 IMPRESSION: 1. Bilateral pulmonary parenchymal opacities, peribronchovascular in distribution. Favor infection or (especially given the clinical history of vomiting) aspiration. Neoplastic processes are felt unlikely but cannot be  entirely excluded. Especially if the patient is diagnosed with lymphoma, consider CT followup after therapy to confirm resolution. 2. No thoracic adenopathy. 3. Mild cardiomegaly. Coronary artery atherosclerosis. Aortic Atherosclerosis (ICD10-I70.0). 4. Development of small bowel dilatation, suspicious for obstruction at the site of distal small bowel thickening on prior exam. 5. Borderline ascending aortic aneurysm. Recommend annual imaging followup by CTA or MRA. This recommendation follows 2010 ACCF/AHA/AATS/ACR/ASA/SCA/SCAI/SIR/STS/SVM Guidelines for the Diagnosis and Management of Patients with Thoracic Aortic Disease. Circulation. 2010; 121: T622-Q333. 6. Nonspecific left upper lobe pulmonary nodule.   RADIOGRAPHIC STUDIES: I have personally reviewed the radiological images as listed and agreed with the findings in the report. Ct Abdomen Pelvis Wo Contrast  Result Date: 12/20/2018 CLINICAL DATA:  Non-Hodgkin's lymphoma. Patient undergoing chemotherapy. EXAM: CT CHEST, ABDOMEN AND PELVIS WITHOUT CONTRAST TECHNIQUE: Multidetector CT imaging of the chest, abdomen and pelvis was performed following the standard protocol without IV contrast. COMPARISON:  CT chest, abdomen and pelvis 05/30/2018. PET CT scan 11/08/2017. FINDINGS: CT CHEST FINDINGS Cardiovascular: Calcific aortic and coronary atherosclerosis noted. Heart size is normal. No pericardial effusion. Mediastinum/Nodes: No enlarged mediastinal, hilar, or axillary lymph nodes. Thyroid gland, trachea, and esophagus demonstrate no significant findings. Lungs/Pleura: Lungs are clear. No pleural effusion or pneumothorax. Musculoskeletal: No fracture or focal lesion. CT ABDOMEN PELVIS FINDINGS  Hepatobiliary: Small gallstones are again seen layering dependently in the gallbladder. Negative for cholecystitis. Biliary tree appears normal. Single punctate calcification in the periphery of the right lobe of the liver is unchanged. The liver otherwise appears normal. Pancreas: Unremarkable. No pancreatic ductal dilatation or surrounding inflammatory changes. Spleen: Normal in size without focal abnormality. Adrenals/Urinary Tract: Left renal cyst is unchanged. The kidneys otherwise appear normal. Ureters and urinary bladder appear normal. The adrenal glands are normal. Stomach/Bowel: Stomach is within normal limits. Appendix appears normal. No evidence of bowel wall thickening, distention, or inflammatory changes. Vascular/Lymphatic: Previously seen lymph node in the central mesentery which had measured 2.5 x 1.9 cm is increased in size to 2.6 x 3.7 cm on image 78 of series 2. It is now contiguous with the previously described 1.8 x 1.5 cm mesenteric node seen on the prior exam. No other enlarged lymph nodes are identified. Atherosclerotic vascular disease is again seen. Reproductive: Mild prostatomegaly noted. Other: None. Musculoskeletal: No lytic or sclerotic lesion. Lumbar degenerative disease is stable in appearance. IMPRESSION: Previously seen lymph node in central mesenteric fat which had measured 2.5 x 1.9 cm on the most recent CT has increased in size and is now contiguous with a second index lymph node on the prior exam. No new enlarged lymph nodes are identified. No other change compared to the prior study. Electronically Signed   By: Inge Rise M.D.   On: 12/20/2018 10:13   Ct Chest Wo Contrast  Result Date: 12/20/2018 CLINICAL DATA:  Non-Hodgkin's lymphoma. Patient undergoing chemotherapy. EXAM: CT CHEST, ABDOMEN AND PELVIS WITHOUT CONTRAST TECHNIQUE: Multidetector CT imaging of the chest, abdomen and pelvis was performed following the standard protocol without IV contrast. COMPARISON:  CT  chest, abdomen and pelvis 05/30/2018. PET CT scan 11/08/2017. FINDINGS: CT CHEST FINDINGS Cardiovascular: Calcific aortic and coronary atherosclerosis noted. Heart size is normal. No pericardial effusion. Mediastinum/Nodes: No enlarged mediastinal, hilar, or axillary lymph nodes. Thyroid gland, trachea, and esophagus demonstrate no significant findings. Lungs/Pleura: Lungs are clear. No pleural effusion or pneumothorax. Musculoskeletal: No fracture or focal lesion. CT ABDOMEN PELVIS FINDINGS Hepatobiliary: Small gallstones are again seen layering dependently in the gallbladder. Negative for cholecystitis. Biliary  tree appears normal. Single punctate calcification in the periphery of the right lobe of the liver is unchanged. The liver otherwise appears normal. Pancreas: Unremarkable. No pancreatic ductal dilatation or surrounding inflammatory changes. Spleen: Normal in size without focal abnormality. Adrenals/Urinary Tract: Left renal cyst is unchanged. The kidneys otherwise appear normal. Ureters and urinary bladder appear normal. The adrenal glands are normal. Stomach/Bowel: Stomach is within normal limits. Appendix appears normal. No evidence of bowel wall thickening, distention, or inflammatory changes. Vascular/Lymphatic: Previously seen lymph node in the central mesentery which had measured 2.5 x 1.9 cm is increased in size to 2.6 x 3.7 cm on image 78 of series 2. It is now contiguous with the previously described 1.8 x 1.5 cm mesenteric node seen on the prior exam. No other enlarged lymph nodes are identified. Atherosclerotic vascular disease is again seen. Reproductive: Mild prostatomegaly noted. Other: None. Musculoskeletal: No lytic or sclerotic lesion. Lumbar degenerative disease is stable in appearance. IMPRESSION: Previously seen lymph node in central mesenteric fat which had measured 2.5 x 1.9 cm on the most recent CT has increased in size and is now contiguous with a second index lymph node on the  prior exam. No new enlarged lymph nodes are identified. No other change compared to the prior study. Electronically Signed   By: Inge Rise M.D.   On: 12/20/2018 10:13    ASSESSMENT & PLAN:   Henry Nichols is a wonderful 85 y.o. 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 No type B symptoms S/p Rituxan weekly x 4 doses Rpt PET/CT 11/08/2017 showed Much improved mesenteric mass as described above. Two small mildly hypermetabolic lymph nodes remain. No other areas of adenopathy are identified in the neck, chest, abdomen or pelvis  05/30/18 CT C/A/P revealed he central mesenteric lymph nodes identified on previous PET studies as hypermetabolic have progressed since the PET-CT of 11/08/2017. No evidence for lymphadenopathy in the chest or pelvis. 2. Left renal cyst. 3.  Aortic Atherosclerois. Marland Kitchen PLAN -Discussed pt labwork today, 12/29/18; blood counts and chemistries are stable. LDH is pending. -Discussed the 12/20/18 CT C/A/P which revealed "Previously seen lymph node in central mesenteric fat which had measured 2.5 x 1.9 cm on the most recent CT has increased in size and is now contiguous with a second index lymph node on the prior exam. No new enlarged lymph nodes are identified. No other change compared to the prior study." -Discussed that there are no new lymph nodes observed in recent CT scan, but there is some slight progression in his abdominal lymph node sizes, which is expected. Changes not clinically significant nor symptomatic. -Discussed that at this point, in the absence of symptoms, the overall clinical context including the patient's age, do not feel that there is overt indication at this time to consider initiating treatment which the pt is in agreement with. -Discussed again the option to pursue maintenance Rituxan vs waiting and watching for symptoms and initiating treatment with symptoms. The pt prefers  a wait and watch approach at this time.  -The pt will continue to look for constitutional symptoms or new abdominal symptoms and will let me know if he develops any new concerns -Recommended that the pt continue to eat well, drink at least 48-64 oz of water each day, and walk 20-30 minutes each day. -Will see the pt back in 4 months   RTC with Dr Irene Limbo with labs in 4 months  All of the patients questions were answered with apparent satisfaction. The patient knows to call the clinic with any problems, questions or concerns.  The total time spent in the appt was 20 minutes and more than 50% was on counseling and direct patient cares.    Sullivan Lone MD [place] AAHIVMS [facility] Hematology/Oncology Physician [facility]  (Office):       [phone] (Work cell):  [phone] (Fax):           [phone]  I, Baldwin Jamaica, am acting as a scribe for Dr. Sullivan Lone.   .I have reviewed the above documentation for accuracy and completeness, and I agree with the above. Brunetta Genera MD

## 2018-12-29 ENCOUNTER — Other Ambulatory Visit: Payer: Self-pay

## 2018-12-29 ENCOUNTER — Inpatient Hospital Stay (HOSPITAL_BASED_OUTPATIENT_CLINIC_OR_DEPARTMENT_OTHER): Payer: Medicare Other | Admitting: Hematology

## 2018-12-29 ENCOUNTER — Inpatient Hospital Stay: Payer: Medicare Other

## 2018-12-29 ENCOUNTER — Other Ambulatory Visit: Payer: Self-pay | Admitting: *Deleted

## 2018-12-29 ENCOUNTER — Telehealth: Payer: Self-pay | Admitting: Hematology

## 2018-12-29 VITALS — BP 125/60 | HR 58 | Temp 98.7°F | Resp 17 | Ht 70.0 in | Wt 158.4 lb

## 2018-12-29 DIAGNOSIS — C8233 Follicular lymphoma grade IIIa, intra-abdominal lymph nodes: Secondary | ICD-10-CM

## 2018-12-29 DIAGNOSIS — Z9221 Personal history of antineoplastic chemotherapy: Secondary | ICD-10-CM | POA: Diagnosis not present

## 2018-12-29 LAB — CBC WITH DIFFERENTIAL (CANCER CENTER ONLY)
Abs Immature Granulocytes: 0.02 10*3/uL (ref 0.00–0.07)
Basophils Absolute: 0.1 10*3/uL (ref 0.0–0.1)
Basophils Relative: 1 %
Eosinophils Absolute: 0.3 10*3/uL (ref 0.0–0.5)
Eosinophils Relative: 5 %
HCT: 37.3 % — ABNORMAL LOW (ref 39.0–52.0)
Hemoglobin: 11.7 g/dL — ABNORMAL LOW (ref 13.0–17.0)
Immature Granulocytes: 0 %
Lymphocytes Relative: 21 %
Lymphs Abs: 1.1 10*3/uL (ref 0.7–4.0)
MCH: 31.3 pg (ref 26.0–34.0)
MCHC: 31.4 g/dL (ref 30.0–36.0)
MCV: 99.7 fL (ref 80.0–100.0)
Monocytes Absolute: 0.5 10*3/uL (ref 0.1–1.0)
Monocytes Relative: 10 %
Neutro Abs: 3.2 10*3/uL (ref 1.7–7.7)
Neutrophils Relative %: 63 %
Platelet Count: 203 10*3/uL (ref 150–400)
RBC: 3.74 MIL/uL — ABNORMAL LOW (ref 4.22–5.81)
RDW: 13 % (ref 11.5–15.5)
WBC Count: 5.2 10*3/uL (ref 4.0–10.5)
nRBC: 0 % (ref 0.0–0.2)

## 2018-12-29 LAB — CMP (CANCER CENTER ONLY)
ALT: 15 U/L (ref 0–44)
AST: 20 U/L (ref 15–41)
Albumin: 3.5 g/dL (ref 3.5–5.0)
Alkaline Phosphatase: 80 U/L (ref 38–126)
Anion gap: 9 (ref 5–15)
BUN: 19 mg/dL (ref 8–23)
CO2: 30 mmol/L (ref 22–32)
Calcium: 9.2 mg/dL (ref 8.9–10.3)
Chloride: 104 mmol/L (ref 98–111)
Creatinine: 1.33 mg/dL — ABNORMAL HIGH (ref 0.61–1.24)
GFR, Est AFR Am: 56 mL/min — ABNORMAL LOW (ref >60)
GFR, Estimated: 49 mL/min — ABNORMAL LOW (ref >60)
Glucose, Bld: 137 mg/dL — ABNORMAL HIGH (ref 70–99)
Potassium: 4.1 mmol/L (ref 3.5–5.1)
Sodium: 143 mmol/L (ref 135–145)
Total Bilirubin: 1.4 mg/dL — ABNORMAL HIGH (ref 0.3–1.2)
Total Protein: 7.1 g/dL (ref 6.5–8.1)

## 2018-12-29 LAB — LACTATE DEHYDROGENASE: LDH: 152 U/L (ref 98–192)

## 2018-12-29 NOTE — Telephone Encounter (Signed)
Scheduled appt per 5/15 los. ° °A calendar will be mailed out. °

## 2019-04-30 NOTE — Progress Notes (Signed)
HEMATOLOGY/ONCOLOGY CLINIC NOTE  Date of Service: 05/01/2019  Patient Care Team: [name], MD as PCP - General (Family Medicine)  CHIEF COMPLAINTS/PURPOSE OF CONSULTATION:  F/u for continued management of small bowel follicular lymphoma  HISTORY OF PRESENTING ILLNESS:   Henry Nichols is a wonderful 85 y.o. male who has been referred to [place] by Dr [name], MD for evaluation and management of a small bowel follicular lymphoma.   He presented to ED for abdominal pain and had a CT chest/abd/pelvis on 06/09/2017 with results showing: IMPRESSION: 1. Bilateral pulmonary parenchymal opacities, peribronchovascular in distribution. Favor infection or (especially given the clinical history of vomiting) aspiration. Neoplastic processes are felt unlikely but cannot be entirely excluded. Especially if the patient is diagnosed with lymphoma, consider CT followup after therapy to confirm resolution. 2. No thoracic adenopathy. 3. Mild cardiomegaly. Coronary artery atherosclerosis. Aortic Atherosclerosis (ICD10-I70.0). 4. Development of small bowel dilatation, suspicious for obstruction at the site of distal small bowel thickening on prior exam. 5. Borderline ascending aortic aneurysm. Recommend annual imaging followup by CTA or MRA. This recommendation follows 2010 ACCF/AHA/AATS/ACR/ASA/SCA/SCAI/SIR/STS/SVM Guidelines for the Diagnosis and Management of Patients with Thoracic Aortic Disease. Circulation. 2010; 121: H476-L465. 6. Nonspecific left upper lobe pulmonary nodule.   He is accompanied by his son today. He states he is doing well overall. He reports noticing a difficulty swallowing and nausea/vomiting after eating. He saw his PCP for this and had a CT scan. He was then referred to Dr. [name] and underwent a laparoscopic small bowel resection with resolution of symptoms.  He states he wasn't losing much weight before these symptoms began and lost 15 pounds after.   Pathology showed  follicular lymphoma   On review of systems, pt reports vomiting, constipation, weight loss, swelling and skin rash to the right ankle, and denies SOB, cough, bloody stools, diarrhea, fever, chills, night sweats and any other accompanying symptoms.   Henry Nichols is here for a scheduled follow-up of his Grade 3A Follicular Lymphoma. The patient's last visit with [place] was on 12/29/2018. The pt reports that he is doing well overall.  The pt reports that he has been feeling good and that he has no new concerns. He has been eating well and doesn't feel different than 4 months ago. 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   Lab results today (05/01/19) of CBC w/diff and CMP is as follows: all values are WNL except for RBC at 3.72, Hgb at 12.0, HCT at 37.4, MCV at 100.5, Creatinine at 1.36, Total Bilirubin at 1.7, GFR Est Non Af Am at 47. 05/01/2019 LDH at 187  On review of systems, pt reports leg swelling and denies abdominal pains, bowel movement changes fevers, chills, night sweats, unexpected weight loss, abdominal pain and any other symptoms.   MEDICAL HISTORY:  Past Medical History:  Diagnosis Date  . Coronary artery calcification 08/03/2017  . Essential hypertension 08/03/2017  . GERD (gastroesophageal reflux disease)   . HOH (hard of hearing)   . Hyperlipidemia 08/03/2017  . Hypertension   . LBBB (left bundle branch block) 08/03/2017  . Non-Hodgkin lymphoma ([place]) 08/03/2017  . Pneumonia    hx  . Small bowel mass   . Varicose vein of leg 08/03/2017    SURGICAL HISTORY: Past Surgical History:  Procedure Laterality Date  . EYE SURGERY Bilateral 2016   cataracts  . HERNIA REPAIR  Bilateral 2008   inguinal  . LAPAROSCOPIC SMALL BOWEL RESECTION  07/06/2017  . LAPAROSCOPIC SMALL BOWEL RESECTION N/A 07/06/2017   Procedure: LAPAROSCOPIC ASSISTED SMALL BOWEL RESECTION;  Surgeon: Coralie Keens, MD;  Location: [facility];  Service: General;  Laterality: N/A;    SOCIAL HISTORY: Social History   Socioeconomic History  . Marital status: Widowed    Spouse name: Not on file  . Number of children: Not on file  . Years of education: Not on file  . Highest education level: Not on file  Occupational History  . Not on file  Social Needs  . Financial resource strain: Not on file  . Food insecurity    Worry: Not on file    Inability: Not on file  . Transportation needs    Medical: Not on file    Non-medical: Not on file  Tobacco Use  . Smoking status: Former Smoker    Types: Cigars    Quit date: 06/29/1984    Years since quitting: 34.8  . Smokeless tobacco: Never Used  Substance and Sexual Activity  . Alcohol use: No    Frequency: Never  . Drug use: No  . Sexual activity: Not on file  Lifestyle  . Physical activity    Days per week: Not on file    Minutes per session: Not on file  . Stress: Not on file  Relationships  . Social Herbalist on phone: Not on file    Gets together: Not on file    Attends religious service: Not on file    Active member of club or organization: Not on file    Attends meetings of clubs or organizations: Not on file    Relationship status: Not on file  . Intimate partner violence    Fear of current or ex partner: Not on file    Emotionally abused: Not on file    Physically abused: Not on file    Forced sexual activity: Not on file  Other Topics Concern  . Not on file  Social History Narrative  . Not on file    FAMILY HISTORY: Family History  Problem Relation Age of Onset  . Alzheimer's disease Mother   . Heart disease Mother   . Heart attack Father     ALLERGIES:  has No Known Allergies.  MEDICATIONS:  Current Outpatient Medications  Medication Sig Dispense Refill  . acetaminophen (TYLENOL) 325 MG tablet Take 2 tablets (650 mg total) by mouth every 6 (six) hours as needed for mild pain or moderate pain.    Marland Kitchen  albuterol (PROVENTIL HFA;VENTOLIN HFA) 108 (90 Base) MCG/ACT inhaler Inhale 1 puff every 6 (six) hours as needed into the lungs for wheezing or shortness of breath.    Marland Kitchen aspirin EC 81 MG tablet Take 81 mg daily by mouth.    Marland Kitchen atenolol-chlorthalidone (TENORETIC) 100-25 MG tablet Take 0.5 tablets daily by mouth.  1  . Multiple Vitamins-Minerals (PRESERVISION AREDS 2 PO) Take 1 capsule 2 (two) times daily by mouth.    . pantoprazole (PROTONIX) 40 MG tablet Take 40 mg daily by mouth.  3  . potassium chloride SA (K-DUR,KLOR-CON) 20 MEQ tablet Take 40 mEq daily by mouth.    . simvastatin (ZOCOR) 40 MG tablet Take 1 tablet (40 mg total) by mouth every evening. 30 tablet 5  . triamcinolone cream (KENALOG) 0.1 % Apply 1 application daily as needed topically (for dry skin).     No current facility-administered medications  for this visit.     REVIEW OF SYSTEMS:    A 10+ POINT REVIEW OF SYSTEMS WAS OBTAINED including neurology, dermatology, psychiatry, cardiac, respiratory, lymph, extremities, GI, GU, Musculoskeletal, constitutional, breasts, reproductive, HEENT.  All pertinent positives are noted in the HPI.  All others are negative.   PHYSICAL EXAMINATION:  ECOG PERFORMANCE STATUS: 1 - Symptomatic but completely ambulatory  .BP 119/74 (BP Location: Left Arm, Patient Position: Sitting)   Pulse (!) 45   Temp 98 F (36.7 C) (Temporal)   Resp 18   Ht 5\' 10"  (1.778 m)   Wt 163 lb 6.4 oz (74.1 kg)   SpO2 100%   BMI 23.45 kg/m   GENERAL:alert, in no acute distress and comfortable SKIN: no acute rashes, no significant lesions EYES: conjunctiva are pink and non-injected, sclera anicteric OROPHARYNX: MMM, no exudates, no oropharyngeal erythema or ulceration NECK: supple, no JVD LYMPH:  no palpable lymphadenopathy in the cervical, axillary or inguinal regions LUNGS: clear to auscultation b/l with normal respiratory effort HEART: regular rate & rhythm ABDOMEN:  normoactive bowel sounds , non tender,  not distended. No palpable hepatosplenomegaly.  Extremity: 1+ pedal edema on the right, no pedal edema on the left PSYCH: alert & oriented x 3 with fluent speech NEURO: no focal motor/sensory deficits  LABORATORY DATA:  I have reviewed the data as listed . CBC Latest Ref Rng & Units 05/01/2019 12/29/2018 12/20/2018  WBC 4.0 - 10.5 K/uL 4.5 5.2 4.8  Hemoglobin 13.0 - 17.0 g/dL 12.0(L) 11.7(L) 12.3(L)  Hematocrit 39.0 - 52.0 % 37.4(L) 37.3(L) 38.1(L)  Platelets 150 - 400 K/uL 201 203 187  HGB 11.9  CMP Latest Ref Rng & Units 05/01/2019 12/29/2018 12/20/2018  Glucose 70 - 99 mg/dL 94 137(H) 101(H)  BUN 8 - 23 mg/dL 19 19 16   Creatinine 0.61 - 1.24 mg/dL 1.36(H) 1.33(H) 1.18  Sodium 135 - 145 mmol/L 142 143 143  Potassium 3.5 - 5.1 mmol/L 4.1 4.1 4.0  Chloride 98 - 111 mmol/L 103 104 104  CO2 22 - 32 mmol/L 32 30 31  Calcium 8.9 - 10.3 mg/dL 9.3 9.2 9.4  Total Protein 6.5 - 8.1 g/dL 6.8 7.1 7.0  Total Bilirubin 0.3 - 1.2 mg/dL 1.7(H) 1.4(H) 1.5(H)  Alkaline Phos 38 - 126 U/L 73 80 75  AST 15 - 41 U/L 30 20 23   ALT 0 - 44 U/L 19 15 18    . Lab Results  Component Value Date   LDH 187 05/01/2019        CT CHEST ABD 06/14/2017 IMPRESSION: 1. Bilateral pulmonary parenchymal opacities, peribronchovascular in distribution. Favor infection or (especially given the clinical history of vomiting) aspiration. Neoplastic processes are felt unlikely but cannot be entirely excluded. Especially if the patient is diagnosed with lymphoma, consider CT followup after therapy to confirm resolution. 2. No thoracic adenopathy. 3. Mild cardiomegaly. Coronary artery atherosclerosis. Aortic Atherosclerosis (ICD10-I70.0). 4. Development of small bowel dilatation, suspicious for obstruction at the site of distal small bowel thickening on prior exam. 5. Borderline ascending aortic aneurysm. Recommend annual imaging followup by CTA or MRA. This recommendation follows 2010  ACCF/AHA/AATS/ACR/ASA/SCA/SCAI/SIR/STS/SVM Guidelines for the Diagnosis and Management of Patients with Thoracic Aortic Disease. Circulation. 2010; 121: P379-K240. 6. Nonspecific left upper lobe pulmonary nodule.   RADIOGRAPHIC STUDIES: I have personally reviewed the radiological images as listed and agreed with the findings in the report. No results found.  ASSESSMENT & PLAN:   Henry Nichols is a wonderful 85 y.o. male with   #1  Stage IVAE high grade Grade 3A Small-bowel follicular Lymphoma with ileocolic progressive LNadenopathy. No type B symptoms S/p Rituxan weekly x 4 doses Rpt PET/CT 11/08/2017 showed Much improved mesenteric mass as described above. Two small mildly hypermetabolic lymph nodes remain. No other areas of adenopathy are identified in the neck, chest, abdomen or pelvis  05/30/18 CT C/A/P revealed he central mesenteric lymph nodes identified on previous PET studies as hypermetabolic have progressed since the PET-CT of 11/08/2017. No evidence for lymphadenopathy in the chest or pelvis. 2. Left renal cyst. 3.  Aortic Atherosclerois.  12/20/18 CT C/A/P which revealed "Previously seen lymph node in central mesenteric fat which had measured 2.5 x 1.9 cm on the most recent CT has increased in size and is now contiguous with a second index lymph node on the prior exam. No new enlarged lymph nodes are identified. No other change compared to the prior study."  PLAN -Discussed pt labwork today, 05/01/19;  all values are WNL except for RBC at 3.72, Hgb at 12.0, HCT at 37.4, MCV at 100.5, Creatinine at 1.36, Total Bilirubin at 1.7, GFR Est Non Af Am at 47. -Discussed 05/01/2019 LDH at 187 -Discussed that at this point, in the absence of symptoms, the overall clinical context including the patient's age, do not feel that there is overt indication at this time to consider initiating treatment which the pt is in agreement with. -The pt will continue to look for constitutional symptoms  or new abdominal symptoms and will let me know if he develops any new concerns -Will see back in 6 months with labs and scans   FOLLOW UP: CT chest/abd/pelvis without contrast in 24 weeks RTC with Dr Irene Limbo with labs in 6 months  The total time spent in the appt was 20 minutes and more than 50% was on counseling and direct patient cares.  All of the patient's questions were answered with apparent satisfaction. The patient knows to call the clinic with any problems, questions or concerns.   Sullivan Lone MD [place] AAHIVMS [facility] Hematology/Oncology Physician [facility]  (Office):       [phone] (Work cell):  [phone] (Fax):           [phone]  I, Yevette Edwards, am acting as a scribe for Dr. Sullivan Lone.   .I have reviewed the above documentation for accuracy and completeness, and I agree with the above. Brunetta Genera MD

## 2019-05-01 ENCOUNTER — Other Ambulatory Visit: Payer: Self-pay

## 2019-05-01 ENCOUNTER — Inpatient Hospital Stay: Payer: Medicare Other | Attending: Hematology

## 2019-05-01 ENCOUNTER — Inpatient Hospital Stay (HOSPITAL_BASED_OUTPATIENT_CLINIC_OR_DEPARTMENT_OTHER): Payer: Medicare Other | Admitting: Hematology

## 2019-05-01 VITALS — BP 119/74 | HR 45 | Temp 98.0°F | Resp 18 | Ht 70.0 in | Wt 163.4 lb

## 2019-05-01 DIAGNOSIS — Z9221 Personal history of antineoplastic chemotherapy: Secondary | ICD-10-CM | POA: Insufficient documentation

## 2019-05-01 DIAGNOSIS — C8233 Follicular lymphoma grade IIIa, intra-abdominal lymph nodes: Secondary | ICD-10-CM | POA: Diagnosis not present

## 2019-05-01 LAB — CBC WITH DIFFERENTIAL/PLATELET
Abs Immature Granulocytes: 0.01 10*3/uL (ref 0.00–0.07)
Basophils Absolute: 0.1 10*3/uL (ref 0.0–0.1)
Basophils Relative: 1 %
Eosinophils Absolute: 0.4 10*3/uL (ref 0.0–0.5)
Eosinophils Relative: 9 %
HCT: 37.4 % — ABNORMAL LOW (ref 39.0–52.0)
Hemoglobin: 12 g/dL — ABNORMAL LOW (ref 13.0–17.0)
Immature Granulocytes: 0 %
Lymphocytes Relative: 26 %
Lymphs Abs: 1.2 10*3/uL (ref 0.7–4.0)
MCH: 32.3 pg (ref 26.0–34.0)
MCHC: 32.1 g/dL (ref 30.0–36.0)
MCV: 100.5 fL — ABNORMAL HIGH (ref 80.0–100.0)
Monocytes Absolute: 0.5 10*3/uL (ref 0.1–1.0)
Monocytes Relative: 10 %
Neutro Abs: 2.4 10*3/uL (ref 1.7–7.7)
Neutrophils Relative %: 54 %
Platelets: 201 10*3/uL (ref 150–400)
RBC: 3.72 MIL/uL — ABNORMAL LOW (ref 4.22–5.81)
RDW: 13.3 % (ref 11.5–15.5)
WBC: 4.5 10*3/uL (ref 4.0–10.5)
nRBC: 0 % (ref 0.0–0.2)

## 2019-05-01 LAB — CMP (CANCER CENTER ONLY)
ALT: 19 U/L (ref 0–44)
AST: 30 U/L (ref 15–41)
Albumin: 3.9 g/dL (ref 3.5–5.0)
Alkaline Phosphatase: 73 U/L (ref 38–126)
Anion gap: 7 (ref 5–15)
BUN: 19 mg/dL (ref 8–23)
CO2: 32 mmol/L (ref 22–32)
Calcium: 9.3 mg/dL (ref 8.9–10.3)
Chloride: 103 mmol/L (ref 98–111)
Creatinine: 1.36 mg/dL — ABNORMAL HIGH (ref 0.61–1.24)
GFR, Est AFR Am: 55 mL/min — ABNORMAL LOW (ref >60)
GFR, Estimated: 47 mL/min — ABNORMAL LOW (ref >60)
Glucose, Bld: 94 mg/dL (ref 70–99)
Potassium: 4.1 mmol/L (ref 3.5–5.1)
Sodium: 142 mmol/L (ref 135–145)
Total Bilirubin: 1.7 mg/dL — ABNORMAL HIGH (ref 0.3–1.2)
Total Protein: 6.8 g/dL (ref 6.5–8.1)

## 2019-05-01 LAB — LACTATE DEHYDROGENASE: LDH: 187 U/L (ref 98–192)

## 2019-05-02 ENCOUNTER — Telehealth: Payer: Self-pay | Admitting: Hematology

## 2019-05-02 NOTE — Telephone Encounter (Signed)
Spoke with patient re March 2021 lab/fu. Central radiology will contact patient re scan.

## 2019-05-07 DIAGNOSIS — E7800 Pure hypercholesterolemia, unspecified: Secondary | ICD-10-CM | POA: Diagnosis not present

## 2019-05-07 DIAGNOSIS — J449 Chronic obstructive pulmonary disease, unspecified: Secondary | ICD-10-CM | POA: Diagnosis not present

## 2019-05-07 DIAGNOSIS — I10 Essential (primary) hypertension: Secondary | ICD-10-CM | POA: Diagnosis not present

## 2019-06-28 DIAGNOSIS — J309 Allergic rhinitis, unspecified: Secondary | ICD-10-CM | POA: Diagnosis not present

## 2019-06-28 DIAGNOSIS — I872 Venous insufficiency (chronic) (peripheral): Secondary | ICD-10-CM | POA: Diagnosis not present

## 2019-06-28 DIAGNOSIS — E7800 Pure hypercholesterolemia, unspecified: Secondary | ICD-10-CM | POA: Diagnosis not present

## 2019-06-28 DIAGNOSIS — Z0000 Encounter for general adult medical examination without abnormal findings: Secondary | ICD-10-CM | POA: Diagnosis not present

## 2019-06-28 DIAGNOSIS — C8290 Follicular lymphoma, unspecified, unspecified site: Secondary | ICD-10-CM | POA: Diagnosis not present

## 2019-06-28 DIAGNOSIS — I10 Essential (primary) hypertension: Secondary | ICD-10-CM | POA: Diagnosis not present

## 2019-06-28 DIAGNOSIS — J449 Chronic obstructive pulmonary disease, unspecified: Secondary | ICD-10-CM | POA: Diagnosis not present

## 2019-07-03 DIAGNOSIS — Z23 Encounter for immunization: Secondary | ICD-10-CM | POA: Diagnosis not present

## 2019-09-06 ENCOUNTER — Ambulatory Visit: Payer: Medicare Other | Attending: Internal Medicine

## 2019-09-06 DIAGNOSIS — Z23 Encounter for immunization: Secondary | ICD-10-CM | POA: Insufficient documentation

## 2019-09-06 NOTE — Progress Notes (Signed)
   Covid-19 Vaccination Clinic  Name:  Henry Nichols    MRN: [id number] DOB: [birth year]  09/06/2019  Henry Nichols was observed post Covid-19 immunization for 15 minutes without incidence. He was provided with Vaccine Information Sheet and instruction to access the V-Safe system.   Henry Nichols was instructed to call 911 with any severe reactions post vaccine: Marland Kitchen Difficulty breathing  . Swelling of your face and throat  . A fast heartbeat  . A bad rash all over your body  . Dizziness and weakness    Immunizations Administered    Name Date Dose VIS Date Route   Pfizer COVID-19 Vaccine 09/06/2019 12:05 PM 0.3 mL 07/27/2019 Intramuscular   Manufacturer: [place]   Lot: [id number]   [place]: [id number]

## 2019-09-27 ENCOUNTER — Ambulatory Visit: Payer: Medicare Other | Attending: Internal Medicine

## 2019-09-27 DIAGNOSIS — Z23 Encounter for immunization: Secondary | ICD-10-CM | POA: Insufficient documentation

## 2019-09-27 NOTE — Progress Notes (Signed)
   Covid-19 Vaccination Clinic  Name:  Henry Nichols    MRN: [id number] DOB: 1932-06-05  09/27/2019  Henry Nichols was observed post Covid-19 immunization for 15 minutes without incidence. He was provided with Vaccine Information Sheet and instruction to access the V-Safe system.   Henry Nichols was instructed to call 911 with any severe reactions post vaccine: Marland Kitchen Difficulty breathing  . Swelling of your face and throat  . A fast heartbeat  . A bad rash all over your body  . Dizziness and weakness    Immunizations Administered    Name Date Dose VIS Date Route   Pfizer COVID-19 Vaccine 09/27/2019 11:32 AM 0.3 mL 07/27/2019 Intramuscular   Manufacturer: [place]   Lot: [id number]   [place]: [id number]

## 2019-10-16 ENCOUNTER — Encounter (HOSPITAL_COMMUNITY): Payer: Self-pay

## 2019-10-16 ENCOUNTER — Ambulatory Visit (HOSPITAL_COMMUNITY)
Admission: RE | Admit: 2019-10-16 | Discharge: 2019-10-16 | Disposition: A | Discharge status: Home / Self Care | Payer: Medicare Other | Source: Ambulatory Visit | Attending: Hematology | Admitting: Hematology

## 2019-10-16 ENCOUNTER — Other Ambulatory Visit: Payer: Self-pay

## 2019-10-16 DIAGNOSIS — C8233 Follicular lymphoma grade IIIa, intra-abdominal lymph nodes: Secondary | ICD-10-CM | POA: Insufficient documentation

## 2019-10-29 ENCOUNTER — Inpatient Hospital Stay: Payer: Medicare Other

## 2019-10-29 ENCOUNTER — Inpatient Hospital Stay: Payer: Medicare Other | Attending: Hematology | Admitting: Hematology

## 2019-10-29 ENCOUNTER — Other Ambulatory Visit: Payer: Self-pay

## 2019-10-29 VITALS — BP 120/68 | HR 44 | Temp 97.8°F | Resp 18 | Ht 70.0 in | Wt 157.6 lb

## 2019-10-29 DIAGNOSIS — C8233 Follicular lymphoma grade IIIa, intra-abdominal lymph nodes: Secondary | ICD-10-CM | POA: Insufficient documentation

## 2019-10-29 DIAGNOSIS — Z9221 Personal history of antineoplastic chemotherapy: Secondary | ICD-10-CM | POA: Diagnosis not present

## 2019-10-29 LAB — CBC WITH DIFFERENTIAL/PLATELET
Abs Immature Granulocytes: 0.02 10*3/uL (ref 0.00–0.07)
Basophils Absolute: 0.1 10*3/uL (ref 0.0–0.1)
Basophils Relative: 1 %
Eosinophils Absolute: 0.2 10*3/uL (ref 0.0–0.5)
Eosinophils Relative: 4 %
HCT: 39.1 % (ref 39.0–52.0)
Hemoglobin: 12.5 g/dL — ABNORMAL LOW (ref 13.0–17.0)
Immature Granulocytes: 0 %
Lymphocytes Relative: 24 %
Lymphs Abs: 1.2 10*3/uL (ref 0.7–4.0)
MCH: 32 pg (ref 26.0–34.0)
MCHC: 32 g/dL (ref 30.0–36.0)
MCV: 100 fL (ref 80.0–100.0)
Monocytes Absolute: 0.5 10*3/uL (ref 0.1–1.0)
Monocytes Relative: 10 %
Neutro Abs: 3.1 10*3/uL (ref 1.7–7.7)
Neutrophils Relative %: 61 %
Platelets: 210 10*3/uL (ref 150–400)
RBC: 3.91 MIL/uL — ABNORMAL LOW (ref 4.22–5.81)
RDW: 13.2 % (ref 11.5–15.5)
WBC: 5.1 10*3/uL (ref 4.0–10.5)
nRBC: 0 % (ref 0.0–0.2)

## 2019-10-29 LAB — CMP (CANCER CENTER ONLY)
ALT: 16 U/L (ref 0–44)
AST: 24 U/L (ref 15–41)
Albumin: 3.8 g/dL (ref 3.5–5.0)
Alkaline Phosphatase: 69 U/L (ref 38–126)
Anion gap: 7 (ref 5–15)
BUN: 18 mg/dL (ref 8–23)
CO2: 31 mmol/L (ref 22–32)
Calcium: 9.5 mg/dL (ref 8.9–10.3)
Chloride: 103 mmol/L (ref 98–111)
Creatinine: 1.29 mg/dL — ABNORMAL HIGH (ref 0.61–1.24)
GFR, Est AFR Am: 58 mL/min — ABNORMAL LOW (ref >60)
GFR, Estimated: 50 mL/min — ABNORMAL LOW (ref >60)
Glucose, Bld: 97 mg/dL (ref 70–99)
Potassium: 4.3 mmol/L (ref 3.5–5.1)
Sodium: 141 mmol/L (ref 135–145)
Total Bilirubin: 2.1 mg/dL — ABNORMAL HIGH (ref 0.3–1.2)
Total Protein: 6.9 g/dL (ref 6.5–8.1)

## 2019-10-29 LAB — LACTATE DEHYDROGENASE: LDH: 181 U/L (ref 98–192)

## 2019-10-29 NOTE — Progress Notes (Signed)
HEMATOLOGY/ONCOLOGY CLINIC NOTE  Date of Service: 10/29/2019  Patient Care Team: [name], MD as PCP - General (Family Medicine)  CHIEF COMPLAINTS/PURPOSE OF CONSULTATION:  F/u for continued management of small bowel follicular lymphoma  HISTORY OF PRESENTING ILLNESS:   Henry Nichols is a wonderful 86 y.o. male who has been referred to [place] by Dr [name], MD for evaluation and management of a small bowel follicular lymphoma.   He presented to ED for abdominal pain and had a CT chest/abd/pelvis on 06/09/2017 with results showing: IMPRESSION: 1. Bilateral pulmonary parenchymal opacities, peribronchovascular in distribution. Favor infection or (especially given the clinical history of vomiting) aspiration. Neoplastic processes are felt unlikely but cannot be entirely excluded. Especially if the patient is diagnosed with lymphoma, consider CT followup after therapy to confirm resolution. 2. No thoracic adenopathy. 3. Mild cardiomegaly. Coronary artery atherosclerosis. Aortic Atherosclerosis (ICD10-I70.0). 4. Development of small bowel dilatation, suspicious for obstruction at the site of distal small bowel thickening on prior exam. 5. Borderline ascending aortic aneurysm. Recommend annual imaging followup by CTA or MRA. This recommendation follows 2010 ACCF/AHA/AATS/ACR/ASA/SCA/SCAI/SIR/STS/SVM Guidelines for the Diagnosis and Management of Patients with Thoracic Aortic Disease. Circulation. 2010; 121: P546-F681. 6. Nonspecific left upper lobe pulmonary nodule.   He is accompanied by his son today. He states he is doing well overall. He reports noticing a difficulty swallowing and nausea/vomiting after eating. He saw his PCP for this and had a CT scan. He was then referred to Dr. [name] and underwent a laparoscopic small bowel resection with resolution of symptoms.  He states he wasn't losing much weight before these symptoms began and lost 15 pounds after.   Pathology showed  follicular lymphoma   On review of systems, pt reports vomiting, constipation, weight loss, swelling and skin rash to the right ankle, and denies SOB, cough, bloody stools, diarrhea, fever, chills, night sweats and any other accompanying symptoms.   Henry Nichols is here for a scheduled follow-up of his Grade 3A Follicular Lymphoma. The patient's last visit with [place] was on 05/01/2019. The pt reports that he is doing well overall.  The pt reports that he has been feeling good and has been able to be as active as he'd like. He is helping his son build a home. Pt denies any new concerns or symptoms.   Of note since the patient's last visit, pt has had CT C/A/P ([id number]) ([id number]) completed on 10/16/2019 with results revealing "1. Dominant small bowel mesenteric mass is similar. Adjacent smaller mesenteric nodes are slightly increased in size, possibly representing mildly progressive disease. 2. No evidence of lymphoma within the chest or pelvis."  Lab results today (10/29/19) of CBC w/diff and CMP is as follows: all values are WNL except for RBC at 3.91, Hgb at 12.5, Creatinine at 1.29, Total Bilirubin at 2.1, GFR Est Non Af Am at 58. 10/29/2019 LDH at 181  On review of systems, pt denies fevers, chills, night sweats, abdominal pain and any other symptoms.   MEDICAL HISTORY:  Past Medical History:  Diagnosis Date  . Coronary artery calcification 08/03/2017  . Essential hypertension 08/03/2017  . GERD (gastroesophageal reflux disease)   . HOH (hard of hearing)   . Hyperlipidemia 08/03/2017  . Hypertension   . LBBB (left bundle branch block) 08/03/2017  . Non-Hodgkin lymphoma ([place]) 08/03/2017  . Pneumonia    hx  . Small bowel mass   . Varicose vein of leg 08/03/2017    SURGICAL HISTORY: Past  Surgical History:  Procedure Laterality Date  . EYE SURGERY Bilateral 2016   cataracts  . HERNIA REPAIR Bilateral 2008   inguinal  . LAPAROSCOPIC SMALL BOWEL RESECTION   07/06/2017  . LAPAROSCOPIC SMALL BOWEL RESECTION N/A 07/06/2017   Procedure: LAPAROSCOPIC ASSISTED SMALL BOWEL RESECTION;  Surgeon: Coralie Keens, MD;  Location: [facility];  Service: General;  Laterality: N/A;    SOCIAL HISTORY: Social History   Socioeconomic History  . Marital status: Widowed    Spouse name: Not on file  . Number of children: Not on file  . Years of education: Not on file  . Highest education level: Not on file  Occupational History  . Not on file  Tobacco Use  . Smoking status: Former Smoker    Types: Cigars    Quit date: 06/29/1984    Years since quitting: 35.3  . Smokeless tobacco: Never Used  Substance and Sexual Activity  . Alcohol use: No  . Drug use: No  . Sexual activity: Not on file  Other Topics Concern  . Not on file  Social History Narrative  . Not on file   Social Determinants of Health   Financial Resource Strain:   . Difficulty of Paying Living Expenses:   Food Insecurity:   . Worried About Charity fundraiser in the Last Year:   . Arboriculturist in the Last Year:   Transportation Needs:   . Film/video editor (Medical):   Marland Kitchen Lack of Transportation (Non-Medical):   Physical Activity:   . Days of Exercise per Week:   . Minutes of Exercise per Session:   Stress:   . Feeling of Stress :   Social Connections:   . Frequency of Communication with Friends and Family:   . Frequency of Social Gatherings with Friends and Family:   . Attends Religious Services:   . Active Member of Clubs or Organizations:   . Attends Archivist Meetings:   Marland Kitchen Marital Status:   Intimate Partner Violence:   . Fear of Current or Ex-Partner:   . Emotionally Abused:   Marland Kitchen Physically Abused:   . Sexually Abused:     FAMILY HISTORY: Family History  Problem Relation Age of Onset  . Alzheimer's disease Mother   . Heart disease Mother   . Heart attack Father     ALLERGIES:  has No Known Allergies.  MEDICATIONS:  Current Outpatient  Medications  Medication Sig Dispense Refill  . acetaminophen (TYLENOL) 325 MG tablet Take 2 tablets (650 mg total) by mouth every 6 (six) hours as needed for mild pain or moderate pain.    Marland Kitchen albuterol (PROVENTIL HFA;VENTOLIN HFA) 108 (90 Base) MCG/ACT inhaler Inhale 1 puff every 6 (six) hours as needed into the lungs for wheezing or shortness of breath.    Marland Kitchen aspirin EC 81 MG tablet Take 81 mg daily by mouth.    Marland Kitchen atenolol-chlorthalidone (TENORETIC) 100-25 MG tablet Take 0.5 tablets daily by mouth.  1  . Multiple Vitamins-Minerals (PRESERVISION AREDS 2 PO) Take 1 capsule 2 (two) times daily by mouth.    . pantoprazole (PROTONIX) 40 MG tablet Take 40 mg daily by mouth.  3  . potassium chloride SA (K-DUR,KLOR-CON) 20 MEQ tablet Take 40 mEq daily by mouth.    . simvastatin (ZOCOR) 40 MG tablet Take 1 tablet (40 mg total) by mouth every evening. 30 tablet 5  . triamcinolone cream (KENALOG) 0.1 % Apply 1 application daily as needed topically (for dry skin).  No current facility-administered medications for this visit.    REVIEW OF SYSTEMS:   A 10+ POINT REVIEW OF SYSTEMS WAS OBTAINED including neurology, dermatology, psychiatry, cardiac, respiratory, lymph, extremities, GI, GU, Musculoskeletal, constitutional, breasts, reproductive, HEENT.  All pertinent positives are noted in the HPI.  All others are negative.    PHYSICAL EXAMINATION:  ECOG PERFORMANCE STATUS: 1 - Symptomatic but completely ambulatory  .BP 120/68 (BP Location: Left Arm, Patient Position: Sitting)   Pulse (!) 44   Temp 97.8 F (36.6 C) (Temporal)   Resp 18   Ht 5\' 10"  (1.778 m)   Wt 157 lb 9.6 oz (71.5 kg)   SpO2 100%   BMI 22.61 kg/m    GENERAL:alert, in no acute distress and comfortable SKIN: no acute rashes, no significant lesions EYES: conjunctiva are pink and non-injected, sclera anicteric OROPHARYNX: MMM, no exudates, no oropharyngeal erythema or ulceration NECK: supple, no JVD LYMPH:  no palpable  lymphadenopathy in the cervical, axillary or inguinal regions LUNGS: clear to auscultation b/l with normal respiratory effort HEART: regular rate & rhythm ABDOMEN:  normoactive bowel sounds , non tender, not distended. No palpable hepatosplenomegaly.  Extremity: trace pedal edema  PSYCH: alert & oriented x 3 with fluent speech NEURO: no focal motor/sensory deficits  LABORATORY DATA:  I have reviewed the data as listed . CBC Latest Ref Rng & Units 10/29/2019 05/01/2019 12/29/2018  WBC 4.0 - 10.5 K/uL 5.1 4.5 5.2  Hemoglobin 13.0 - 17.0 g/dL 12.5(L) 12.0(L) 11.7(L)  Hematocrit 39.0 - 52.0 % 39.1 37.4(L) 37.3(L)  Platelets 150 - 400 K/uL 210 201 203  HGB 11.9  CMP Latest Ref Rng & Units 10/29/2019 05/01/2019 12/29/2018  Glucose 70 - 99 mg/dL 97 94 137(H)  BUN 8 - 23 mg/dL 18 19 19   Creatinine 0.61 - 1.24 mg/dL 1.29(H) 1.36(H) 1.33(H)  Sodium 135 - 145 mmol/L 141 142 143  Potassium 3.5 - 5.1 mmol/L 4.3 4.1 4.1  Chloride 98 - 111 mmol/L 103 103 104  CO2 22 - 32 mmol/L 31 32 30  Calcium 8.9 - 10.3 mg/dL 9.5 9.3 9.2  Total Protein 6.5 - 8.1 g/dL 6.9 6.8 7.1  Total Bilirubin 0.3 - 1.2 mg/dL 2.1(H) 1.7(H) 1.4(H)  Alkaline Phos 38 - 126 U/L 69 73 80  AST 15 - 41 U/L 24 30 20   ALT 0 - 44 U/L 16 19 15    . Lab Results  Component Value Date   LDH 181 10/29/2019        CT CHEST ABD 06/14/2017 IMPRESSION: 1. Bilateral pulmonary parenchymal opacities, peribronchovascular in distribution. Favor infection or (especially given the clinical history of vomiting) aspiration. Neoplastic processes are felt unlikely but cannot be entirely excluded. Especially if the patient is diagnosed with lymphoma, consider CT followup after therapy to confirm resolution. 2. No thoracic adenopathy. 3. Mild cardiomegaly. Coronary artery atherosclerosis. Aortic Atherosclerosis (ICD10-I70.0). 4. Development of small bowel dilatation, suspicious for obstruction at the site of distal small bowel thickening on  prior exam. 5. Borderline ascending aortic aneurysm. Recommend annual imaging followup by CTA or MRA. This recommendation follows 2010 ACCF/AHA/AATS/ACR/ASA/SCA/SCAI/SIR/STS/SVM Guidelines for the Diagnosis and Management of Patients with Thoracic Aortic Disease. Circulation. 2010; 121: Y606-T016. 6. Nonspecific left upper lobe pulmonary nodule.   RADIOGRAPHIC STUDIES: I have personally reviewed the radiological images as listed and agreed with the findings in the report. CT Abdomen Pelvis Wo Contrast  Result Date: 10/16/2019 CLINICAL DATA:  Re-evaluate follicular lymphoma. Rituxan on going. Chronic kidney disease. EXAM: CT CHEST, ABDOMEN AND  PELVIS WITHOUT CONTRAST TECHNIQUE: Multidetector CT imaging of the chest, abdomen and pelvis was performed following the standard protocol without IV contrast. COMPARISON:  12/20/2018. FINDINGS: CT CHEST FINDINGS Cardiovascular: Ascending aortic borderline aneurysmal dilatation, similar at 4.0 cm on 65/2. Aortic atherosclerosis. Mild cardiomegaly, without pericardial effusion. Multivessel coronary artery atherosclerosis. Mediastinum/Nodes: No supraclavicular adenopathy. No mediastinal or definite hilar adenopathy, given limitations of unenhanced CT. Lungs/Pleura: Lingular and left lower lobe scarring. No pleural fluid. Musculoskeletal: Moderate thoracic spondylosis. CT ABDOMEN PELVIS FINDINGS Hepatobiliary: Normal noncontrast appearance of the liver. Multiple small gallstones. No acute cholecystitis or biliary duct dilatation. Pancreas: Normal, without mass or ductal dilatation. Spleen: Normal in size, without focal abnormality. Adrenals/Urinary Tract: Normal adrenal glands. Mild renal cortical thinning bilaterally. Interpolar 3.4 cm left renal low-density lesion is most consistent with a cyst. No renal calculi or hydronephrosis. No bladder calculi. Stomach/Bowel: Portions of the stomach are underdistended. Normal colon, appendix, and terminal ileum. Normal small  bowel. Vascular/Lymphatic: Aortic atherosclerosis. Small bowel mesenteric nodal mass measures 4.0 x 2.6 cm on 39/2. Compare 3.7 x 2.6 cm on the prior exam (when remeasured). 3.9 cm craniocaudal on 31/4 versus 4.2 cm on the prior exam (when remeasured). Nodes just posterior to the dominant mass are slightly more prominent today including at 7 mm on 39/2. Also 10 mm on 43/2. 2-3 mm on the prior. No pelvic sidewall adenopathy. Reproductive: Mild prostatomegaly. Other: No significant free fluid. Musculoskeletal: Degenerative partial fusion of the bilateral sacroiliac joints. Trace L4-5 anterolisthesis. IMPRESSION: 1. Dominant small bowel mesenteric mass is similar. Adjacent smaller mesenteric nodes are slightly increased in size, possibly representing mildly progressive disease. 2. No evidence of lymphoma within the chest or pelvis. 3. Borderline ascending aortic dilatation, similar at 4.0 cm. Recommend annual imaging followup by CTA or MRA. This recommendation follows 2010 ACCF/AHA/AATS/ACR/ASA/SCA/SCAI/SIR/STS/SVM Guidelines for the Diagnosis and Management of Patients with Thoracic Aortic Disease. Circulation. 2010; 121: N361-W431. Aortic aneurysm NOS (ICD10-I71.9) 4. Coronary artery atherosclerosis. Aortic Atherosclerosis (ICD10-I70.0). 5. Cholelithiasis. Electronically Signed   By: Abigail Miyamoto M.D.   On: 10/16/2019 11:44   CT Chest Wo Contrast  Result Date: 10/16/2019 CLINICAL DATA:  Re-evaluate follicular lymphoma. Rituxan on going. Chronic kidney disease. EXAM: CT CHEST, ABDOMEN AND PELVIS WITHOUT CONTRAST TECHNIQUE: Multidetector CT imaging of the chest, abdomen and pelvis was performed following the standard protocol without IV contrast. COMPARISON:  12/20/2018. FINDINGS: CT CHEST FINDINGS Cardiovascular: Ascending aortic borderline aneurysmal dilatation, similar at 4.0 cm on 65/2. Aortic atherosclerosis. Mild cardiomegaly, without pericardial effusion. Multivessel coronary artery atherosclerosis.  Mediastinum/Nodes: No supraclavicular adenopathy. No mediastinal or definite hilar adenopathy, given limitations of unenhanced CT. Lungs/Pleura: Lingular and left lower lobe scarring. No pleural fluid. Musculoskeletal: Moderate thoracic spondylosis. CT ABDOMEN PELVIS FINDINGS Hepatobiliary: Normal noncontrast appearance of the liver. Multiple small gallstones. No acute cholecystitis or biliary duct dilatation. Pancreas: Normal, without mass or ductal dilatation. Spleen: Normal in size, without focal abnormality. Adrenals/Urinary Tract: Normal adrenal glands. Mild renal cortical thinning bilaterally. Interpolar 3.4 cm left renal low-density lesion is most consistent with a cyst. No renal calculi or hydronephrosis. No bladder calculi. Stomach/Bowel: Portions of the stomach are underdistended. Normal colon, appendix, and terminal ileum. Normal small bowel. Vascular/Lymphatic: Aortic atherosclerosis. Small bowel mesenteric nodal mass measures 4.0 x 2.6 cm on 39/2. Compare 3.7 x 2.6 cm on the prior exam (when remeasured). 3.9 cm craniocaudal on 31/4 versus 4.2 cm on the prior exam (when remeasured). Nodes just posterior to the dominant mass are slightly more prominent today including at 7  mm on 39/2. Also 10 mm on 43/2. 2-3 mm on the prior. No pelvic sidewall adenopathy. Reproductive: Mild prostatomegaly. Other: No significant free fluid. Musculoskeletal: Degenerative partial fusion of the bilateral sacroiliac joints. Trace L4-5 anterolisthesis. IMPRESSION: 1. Dominant small bowel mesenteric mass is similar. Adjacent smaller mesenteric nodes are slightly increased in size, possibly representing mildly progressive disease. 2. No evidence of lymphoma within the chest or pelvis. 3. Borderline ascending aortic dilatation, similar at 4.0 cm. Recommend annual imaging followup by CTA or MRA. This recommendation follows 2010 ACCF/AHA/AATS/ACR/ASA/SCA/SCAI/SIR/STS/SVM Guidelines for the Diagnosis and Management of Patients with  Thoracic Aortic Disease. Circulation. 2010; 121: A193-X902. Aortic aneurysm NOS (ICD10-I71.9) 4. Coronary artery atherosclerosis. Aortic Atherosclerosis (ICD10-I70.0). 5. Cholelithiasis. Electronically Signed   By: Abigail Miyamoto M.D.   On: 10/16/2019 11:44    ASSESSMENT & PLAN:   LAMARCUS SPIRA is a wonderful 86 y.o. 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 No type B symptoms S/p Rituxan weekly x 4 doses Rpt PET/CT 11/08/2017 showed Much improved mesenteric mass as described above. Two small mildly hypermetabolic lymph nodes remain. No other areas of adenopathy are identified in the neck, chest, abdomen or pelvis  05/30/18 CT C/A/P revealed he central mesenteric lymph nodes identified on previous PET studies as hypermetabolic have progressed since the PET-CT of 11/08/2017. No evidence for lymphadenopathy in the chest or pelvis. 2. Left renal cyst. 3.  Aortic Atherosclerois.  12/20/18 CT C/A/P which revealed "Previously seen lymph node in central mesenteric fat which had measured 2.5 x 1.9 cm on the most recent CT has increased in size and is now contiguous with a second index lymph node on the prior exam. No new enlarged lymph nodes are identified. No other change compared to the prior study."  PLAN -Discussed pt labwork today, 10/29/19; RBC and Hgb have improved, PLT and WBC are normal, blood chemistries are stable  -Discussed 10/29/2019 LDH is WNL at 181 -Discussed 10/16/2019 CT C/A/P ([id number]) ([id number]) which revealed no enlarged lymph nodes in the chest or pelvis, normal spleen, mass in small bowel mesentery.  -Recommended that the pt continue to eat well, drink at least 48-64 oz of water each day, and walk 20-30 minutes each day.  -Due to the absence of symptoms and stable disease on radiographic examination there is no indication at this time to consider initiating treatment, which the pt is in agreement  with. -The pt will continue to look for constitutional symptoms or new abdominal symptoms and will let me know if he develops any new concerns. -Will see back in 6 months   FOLLOW UP: RTC with Dr Irene Limbo with labs in 6 months   The total time spent in the appt was 20 minutes and more than 50% was on counseling and direct patient cares.  All of the patient's questions were answered with apparent satisfaction. The patient knows to call the clinic with any problems, questions or concerns.   Sullivan Lone MD [place] AAHIVMS [facility] Hematology/Oncology Physician [facility]  (Office):       [phone] (Work cell):  [phone] (Fax):           [phone]  I, Yevette Edwards, am acting as a scribe for Dr. Sullivan Lone.   .I have reviewed the above documentation for accuracy and completeness, and I agree with the above. Brunetta Genera MD

## 2019-10-31 ENCOUNTER — Encounter (INDEPENDENT_AMBULATORY_CARE_PROVIDER_SITE_OTHER): Payer: Medicare Other | Admitting: Ophthalmology

## 2019-10-31 ENCOUNTER — Telehealth: Payer: Self-pay | Admitting: Hematology

## 2019-10-31 DIAGNOSIS — H43813 Vitreous degeneration, bilateral: Secondary | ICD-10-CM

## 2019-10-31 DIAGNOSIS — H35033 Hypertensive retinopathy, bilateral: Secondary | ICD-10-CM

## 2019-10-31 DIAGNOSIS — H35371 Puckering of macula, right eye: Secondary | ICD-10-CM

## 2019-10-31 DIAGNOSIS — I10 Essential (primary) hypertension: Secondary | ICD-10-CM | POA: Diagnosis not present

## 2019-10-31 DIAGNOSIS — H353132 Nonexudative age-related macular degeneration, bilateral, intermediate dry stage: Secondary | ICD-10-CM | POA: Diagnosis not present

## 2019-10-31 NOTE — Telephone Encounter (Signed)
Scheduled per 03/15 los, patient has been called and notified. 

## 2020-04-30 ENCOUNTER — Other Ambulatory Visit: Payer: Self-pay

## 2020-04-30 ENCOUNTER — Inpatient Hospital Stay: Payer: Medicare Other | Attending: Hematology

## 2020-04-30 ENCOUNTER — Telehealth: Payer: Self-pay | Admitting: Hematology

## 2020-04-30 ENCOUNTER — Inpatient Hospital Stay: Payer: Medicare Other

## 2020-04-30 ENCOUNTER — Inpatient Hospital Stay (HOSPITAL_BASED_OUTPATIENT_CLINIC_OR_DEPARTMENT_OTHER): Payer: Medicare Other | Admitting: Hematology

## 2020-04-30 VITALS — BP 110/55 | HR 45 | Temp 97.0°F | Resp 18 | Ht 70.0 in | Wt 151.0 lb

## 2020-04-30 DIAGNOSIS — Z9221 Personal history of antineoplastic chemotherapy: Secondary | ICD-10-CM | POA: Diagnosis not present

## 2020-04-30 DIAGNOSIS — C8233 Follicular lymphoma grade IIIa, intra-abdominal lymph nodes: Secondary | ICD-10-CM | POA: Diagnosis not present

## 2020-04-30 DIAGNOSIS — Z23 Encounter for immunization: Secondary | ICD-10-CM

## 2020-04-30 LAB — CMP (CANCER CENTER ONLY)
ALT: 17 U/L (ref 0–44)
AST: 24 U/L (ref 15–41)
Albumin: 3.8 g/dL (ref 3.5–5.0)
Alkaline Phosphatase: 68 U/L (ref 38–126)
Anion gap: 6 (ref 5–15)
BUN: 24 mg/dL — ABNORMAL HIGH (ref 8–23)
CO2: 32 mmol/L (ref 22–32)
Calcium: 9.6 mg/dL (ref 8.9–10.3)
Chloride: 104 mmol/L (ref 98–111)
Creatinine: 1.43 mg/dL — ABNORMAL HIGH (ref 0.61–1.24)
GFR, Est AFR Am: 51 mL/min — ABNORMAL LOW (ref >60)
GFR, Estimated: 44 mL/min — ABNORMAL LOW (ref >60)
Glucose, Bld: 71 mg/dL (ref 70–99)
Potassium: 4 mmol/L (ref 3.5–5.1)
Sodium: 142 mmol/L (ref 135–145)
Total Bilirubin: 2 mg/dL — ABNORMAL HIGH (ref 0.3–1.2)
Total Protein: 7.1 g/dL (ref 6.5–8.1)

## 2020-04-30 LAB — CBC WITH DIFFERENTIAL/PLATELET
Abs Immature Granulocytes: 0.02 10*3/uL (ref 0.00–0.07)
Basophils Absolute: 0.1 10*3/uL (ref 0.0–0.1)
Basophils Relative: 2 %
Eosinophils Absolute: 0.3 10*3/uL (ref 0.0–0.5)
Eosinophils Relative: 5 %
HCT: 37 % — ABNORMAL LOW (ref 39.0–52.0)
Hemoglobin: 12 g/dL — ABNORMAL LOW (ref 13.0–17.0)
Immature Granulocytes: 0 %
Lymphocytes Relative: 24 %
Lymphs Abs: 1.3 10*3/uL (ref 0.7–4.0)
MCH: 32.3 pg (ref 26.0–34.0)
MCHC: 32.4 g/dL (ref 30.0–36.0)
MCV: 99.5 fL (ref 80.0–100.0)
Monocytes Absolute: 0.5 10*3/uL (ref 0.1–1.0)
Monocytes Relative: 10 %
Neutro Abs: 3.2 10*3/uL (ref 1.7–7.7)
Neutrophils Relative %: 59 %
Platelets: 205 10*3/uL (ref 150–400)
RBC: 3.72 MIL/uL — ABNORMAL LOW (ref 4.22–5.81)
RDW: 13.1 % (ref 11.5–15.5)
WBC: 5.4 10*3/uL (ref 4.0–10.5)
nRBC: 0 % (ref 0.0–0.2)

## 2020-04-30 LAB — LACTATE DEHYDROGENASE: LDH: 182 U/L (ref 98–192)

## 2020-04-30 NOTE — Progress Notes (Signed)
° °  Covid-19 Vaccination Clinic  Name:  Henry Nichols    MRN: [id number] DOB: 1932/03/05  04/30/2020  Henry Nichols was observed post Covid-19 immunization for 15 minutes without incident. He was provided with Vaccine Information Sheet and instruction to access the V-Safe system.   Henry Nichols was instructed to call 911 with any severe reactions post vaccine:  Difficulty breathing   Swelling of face and throat   A fast heartbeat   A bad rash all over body   Dizziness and weakness

## 2020-04-30 NOTE — Telephone Encounter (Signed)
Scheduled per 9/15 los. Printed avs and calendar for pt.

## 2020-04-30 NOTE — Progress Notes (Signed)
HEMATOLOGY/ONCOLOGY CLINIC NOTE  Date of Service: 04/30/2020  Patient Care Team: [name], MD as PCP - General (Family Medicine)  CHIEF COMPLAINTS/PURPOSE OF CONSULTATION:  F/u for continued management of small bowel follicular lymphoma  HISTORY OF PRESENTING ILLNESS:   Henry Nichols is a wonderful 86 y.o. male who has been referred to [place] by Dr [name], MD for evaluation and management of a small bowel follicular lymphoma.   He presented to ED for abdominal pain and had a CT chest/abd/pelvis on 06/09/2017 with results showing: IMPRESSION: 1. Bilateral pulmonary parenchymal opacities, peribronchovascular in distribution. Favor infection or (especially given the clinical history of vomiting) aspiration. Neoplastic processes are felt unlikely but cannot be entirely excluded. Especially if the patient is diagnosed with lymphoma, consider CT followup after therapy to confirm resolution. 2. No thoracic adenopathy. 3. Mild cardiomegaly. Coronary artery atherosclerosis. Aortic Atherosclerosis (ICD10-I70.0). 4. Development of small bowel dilatation, suspicious for obstruction at the site of distal small bowel thickening on prior exam. 5. Borderline ascending aortic aneurysm. Recommend annual imaging followup by CTA or MRA. This recommendation follows 2010 ACCF/AHA/AATS/ACR/ASA/SCA/SCAI/SIR/STS/SVM Guidelines for the Diagnosis and Management of Patients with Thoracic Aortic Disease. Circulation. 2010; 121: D664-Q034. 6. Nonspecific left upper lobe pulmonary nodule.   He is accompanied by his son today. He states he is doing well overall. He reports noticing a difficulty swallowing and nausea/vomiting after eating. He saw his PCP for this and had a CT scan. He was then referred to Dr. [name] and underwent a laparoscopic small bowel resection with resolution of symptoms.  He states he wasn't losing much weight before these symptoms began and lost 15 pounds after.   Pathology showed  follicular lymphoma   On review of systems, pt reports vomiting, constipation, weight loss, swelling and skin rash to the right ankle, and denies SOB, cough, bloody stools, diarrhea, fever, chills, night sweats and any other accompanying symptoms.   [name] is here for a scheduled follow-up of his Grade 3A Follicular Lymphoma. The patient's last visit with [place] was on 10/29/2019. The pt reports that he is doing well overall.  The pt reports that he has been feeling and eating well. He has lost a few lbs since our last visit, but attributes this to being more active in the summer. Pt has small stools that tend to stick together when he has a bowel movement. He denies any other bowel habit changes. Pt is scheduled to see Dr. Kenton Kingfisher within the month.   Lab results today (04/30/20) of CBC w/diff and CMP is as follows: all values are WNL except for RBC at 3.72, Hgb at 12.0, HCT at 37.0, BUN at 24, Creatinine at 1.43, Total Bilirubin at 2.0, GFR Est Non Af Am at 44. 04/30/2020 LDH at 182  On review of systems, pt denies new lumps/bumps, abdominal pain, fevers, chills, night sweats, unexpected weight loss, back pain and any other symptoms.   MEDICAL HISTORY:  Past Medical History:  Diagnosis Date  . Coronary artery calcification 08/03/2017  . Essential hypertension 08/03/2017  . GERD (gastroesophageal reflux disease)   . HOH (hard of hearing)   . Hyperlipidemia 08/03/2017  . Hypertension   . LBBB (left bundle branch block) 08/03/2017  . Non-Hodgkin lymphoma ([place]) 08/03/2017  . Pneumonia    hx  . Small bowel mass   . Varicose vein of leg 08/03/2017    SURGICAL HISTORY: Past Surgical History:  Procedure Laterality Date  . EYE SURGERY Bilateral 2016  cataracts  . HERNIA REPAIR Bilateral 2008   inguinal  . LAPAROSCOPIC SMALL BOWEL RESECTION  07/06/2017  . LAPAROSCOPIC SMALL BOWEL RESECTION N/A 07/06/2017   Procedure: LAPAROSCOPIC ASSISTED SMALL BOWEL RESECTION;   Surgeon: Coralie Keens, MD;  Location: [facility];  Service: General;  Laterality: N/A;    SOCIAL HISTORY: Social History   Socioeconomic History  . Marital status: Widowed    Spouse name: Not on file  . Number of children: Not on file  . Years of education: Not on file  . Highest education level: Not on file  Occupational History  . Not on file  Tobacco Use  . Smoking status: Former Smoker    Types: Cigars    Quit date: 06/29/1984    Years since quitting: 35.8  . Smokeless tobacco: Never Used  Vaping Use  . Vaping Use: Never used  Substance and Sexual Activity  . Alcohol use: No  . Drug use: No  . Sexual activity: Not on file  Other Topics Concern  . Not on file  Social History Narrative  . Not on file   Social Determinants of Health   Financial Resource Strain:   . Difficulty of Paying Living Expenses: Not on file  Food Insecurity:   . Worried About Charity fundraiser in the Last Year: Not on file  . Ran Out of Food in the Last Year: Not on file  Transportation Needs:   . Lack of Transportation (Medical): Not on file  . Lack of Transportation (Non-Medical): Not on file  Physical Activity:   . Days of Exercise per Week: Not on file  . Minutes of Exercise per Session: Not on file  Stress:   . Feeling of Stress : Not on file  Social Connections:   . Frequency of Communication with Friends and Family: Not on file  . Frequency of Social Gatherings with Friends and Family: Not on file  . Attends Religious Services: Not on file  . Active Member of Clubs or Organizations: Not on file  . Attends Archivist Meetings: Not on file  . Marital Status: Not on file  Intimate Partner Violence:   . Fear of Current or Ex-Partner: Not on file  . Emotionally Abused: Not on file  . Physically Abused: Not on file  . Sexually Abused: Not on file    FAMILY HISTORY: Family History  Problem Relation Age of Onset  . Alzheimer's disease Mother   . Heart disease Mother    . Heart attack Father     ALLERGIES:  has No Known Allergies.  MEDICATIONS:  Current Outpatient Medications  Medication Sig Dispense Refill  . acetaminophen (TYLENOL) 325 MG tablet Take 2 tablets (650 mg total) by mouth every 6 (six) hours as needed for mild pain or moderate pain.    Marland Kitchen albuterol (PROVENTIL HFA;VENTOLIN HFA) 108 (90 Base) MCG/ACT inhaler Inhale 1 puff every 6 (six) hours as needed into the lungs for wheezing or shortness of breath.    Marland Kitchen aspirin EC 81 MG tablet Take 81 mg daily by mouth.    Marland Kitchen atenolol-chlorthalidone (TENORETIC) 100-25 MG tablet Take 0.5 tablets daily by mouth.  1  . Multiple Vitamins-Minerals (PRESERVISION AREDS 2 PO) Take 1 capsule 2 (two) times daily by mouth.    . pantoprazole (PROTONIX) 40 MG tablet Take 40 mg daily by mouth.  3  . potassium chloride SA (K-DUR,KLOR-CON) 20 MEQ tablet Take 40 mEq daily by mouth.    . simvastatin (ZOCOR) 40 MG  tablet Take 1 tablet (40 mg total) by mouth every evening. 30 tablet 5  . triamcinolone cream (KENALOG) 0.1 % Apply 1 application daily as needed topically (for dry skin).     No current facility-administered medications for this visit.    REVIEW OF SYSTEMS:   A 10+ POINT REVIEW OF SYSTEMS WAS OBTAINED including neurology, dermatology, psychiatry, cardiac, respiratory, lymph, extremities, GI, GU, Musculoskeletal, constitutional, breasts, reproductive, HEENT.  All pertinent positives are noted in the HPI.  All others are negative.    PHYSICAL EXAMINATION:  ECOG PERFORMANCE STATUS: 1 - Symptomatic but completely ambulatory  .BP (!) 110/55 (BP Location: Left Arm, Patient Position: Sitting)   Pulse (!) 45   Temp (!) 97 F (36.1 C) (Tympanic)   Resp 18   Ht 5\' 10"  (1.778 m)   Wt 151 lb (68.5 kg)   SpO2 100%   BMI 21.67 kg/m    GENERAL:alert, in no acute distress and comfortable SKIN: no acute rashes, no significant lesions EYES: conjunctiva are pink and non-injected, sclera anicteric OROPHARYNX: MMM, no  exudates, no oropharyngeal erythema or ulceration NECK: supple, no JVD LYMPH:  no palpable lymphadenopathy in the cervical, axillary or inguinal regions LUNGS: clear to auscultation b/l with normal respiratory effort HEART: regular rate & rhythm ABDOMEN:  normoactive bowel sounds , non tender, not distended. No palpable hepatosplenomegaly.  Extremity: trace pedal edema b/l PSYCH: alert & oriented x 3 with fluent speech NEURO: no focal motor/sensory deficits  LABORATORY DATA:  I have reviewed the data as listed . CBC Latest Ref Rng & Units 04/30/2020 10/29/2019 05/01/2019  WBC 4.0 - 10.5 K/uL 5.4 5.1 4.5  Hemoglobin 13.0 - 17.0 g/dL 12.0(L) 12.5(L) 12.0(L)  Hematocrit 39 - 52 % 37.0(L) 39.1 37.4(L)  Platelets 150 - 400 K/uL 205 210 201  HGB 11.9  CMP Latest Ref Rng & Units 04/30/2020 10/29/2019 05/01/2019  Glucose 70 - 99 mg/dL 71 97 94  BUN 8 - 23 mg/dL 24(H) 18 19  Creatinine 0.61 - 1.24 mg/dL 1.43(H) 1.29(H) 1.36(H)  Sodium 135 - 145 mmol/L 142 141 142  Potassium 3.5 - 5.1 mmol/L 4.0 4.3 4.1  Chloride 98 - 111 mmol/L 104 103 103  CO2 22 - 32 mmol/L 32 31 32  Calcium 8.9 - 10.3 mg/dL 9.6 9.5 9.3  Total Protein 6.5 - 8.1 g/dL 7.1 6.9 6.8  Total Bilirubin 0.3 - 1.2 mg/dL 2.0(H) 2.1(H) 1.7(H)  Alkaline Phos 38 - 126 U/L 68 69 73  AST 15 - 41 U/L 24 24 30   ALT 0 - 44 U/L 17 16 19    . Lab Results  Component Value Date   LDH 182 04/30/2020        CT CHEST ABD 06/14/2017 IMPRESSION: 1. Bilateral pulmonary parenchymal opacities, peribronchovascular in distribution. Favor infection or (especially given the clinical history of vomiting) aspiration. Neoplastic processes are felt unlikely but cannot be entirely excluded. Especially if the patient is diagnosed with lymphoma, consider CT followup after therapy to confirm resolution. 2. No thoracic adenopathy. 3. Mild cardiomegaly. Coronary artery atherosclerosis. Aortic Atherosclerosis (ICD10-I70.0). 4. Development of small bowel  dilatation, suspicious for obstruction at the site of distal small bowel thickening on prior exam. 5. Borderline ascending aortic aneurysm. Recommend annual imaging followup by CTA or MRA. This recommendation follows 2010 ACCF/AHA/AATS/ACR/ASA/SCA/SCAI/SIR/STS/SVM Guidelines for the Diagnosis and Management of Patients with Thoracic Aortic Disease. Circulation. 2010; 121: T465-K812. 6. Nonspecific left upper lobe pulmonary nodule.   RADIOGRAPHIC STUDIES: I have personally reviewed the radiological images as  listed and agreed with the findings in the report. No results found.  ASSESSMENT & PLAN:   MAXEMILIANO RIEL is a wonderful 86 y.o. 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 No type B symptoms S/p Rituxan weekly x 4 doses Rpt PET/CT 11/08/2017 showed Much improved mesenteric mass as described above. Two small mildly hypermetabolic lymph nodes remain. No other areas of adenopathy are identified in the neck, chest, abdomen or pelvis  05/30/18 CT C/A/P revealed he central mesenteric lymph nodes identified on previous PET studies as hypermetabolic have progressed since the PET-CT of 11/08/2017. No evidence for lymphadenopathy in the chest or pelvis. 2. Left renal cyst. 3.  Aortic Atherosclerois.  12/20/18 CT C/A/P which revealed "Previously seen lymph node in central mesenteric fat which had measured 2.5 x 1.9 cm on the most recent CT has increased in size and is now contiguous with a second index lymph node on the prior exam. No new enlarged lymph nodes are identified. No other change compared to the prior study."  10/16/2019 CT C/A/P ([id number]) ([id number]) revealed no enlarged lymph nodes in the chest or pelvis, normal spleen, mass in small bowel mesentery.   PLAN: -Discussed pt labwork today, 04/30/20; anemia is relatively stable, blood chemistries are steady, LDH is WNL -No lab or clinical evidence of FL  recurrence /progression at this time. No indication to begin treatment. Will continue watchful observation.  -The pt will continue to look for constitutional symptoms or new abdominal symptoms and will let me know if he develops any new concerns. -Recommended that the pt continue to eat well, drink at least 48-64 oz of water each day, and walk 20-30 minutes each day.  -Discussed the CDC guidelines regarding the COVID19 booster. Pt will receive in clinic today.  -Recommend pt receive the annual flu vaccine with his PCP in 2-3 weeks.  -Will see back in 6 months with labs     FOLLOW UP: Plz sent to covid vaccine clinic for covid 3rd booster dose Patient to f/u with PCP for flu shot in 2-3 weeks RTC with Dr Irene Limbo with labs in 6 months   The total time spent in the appt was 20 minutes and more than 50% was on counseling and direct patient cares.  All of the patient's questions were answered with apparent satisfaction. The patient knows to call the clinic with any problems, questions or concerns.   Henry Lone MD [place] AAHIVMS [facility] Hematology/Oncology Physician [facility]  (Office):       [phone] (Work cell):  [phone] (Fax):           [phone]  I, Yevette Edwards, am acting as a scribe for Dr. Sullivan Nichols.   .I have reviewed the above documentation for accuracy and completeness, and I agree with the above. Brunetta Genera MD

## 2020-10-27 NOTE — Progress Notes (Signed)
HEMATOLOGY/ONCOLOGY CLINIC NOTE  Date of Service: 10/28/2020  Patient Care Team: [name], MD as PCP - General (Family Medicine)  CHIEF COMPLAINTS/PURPOSE OF CONSULTATION:  F/u for continued management of small bowel follicular lymphoma  HISTORY OF PRESENTING ILLNESS:   Henry Nichols is a wonderful 87 y.o. male who has been referred to [place] by Dr [name], MD for evaluation and management of a small bowel follicular lymphoma.   He presented to ED for abdominal pain and had a CT chest/abd/pelvis on 06/09/2017 with results showing: IMPRESSION: 1. Bilateral pulmonary parenchymal opacities, peribronchovascular in distribution. Favor infection or (especially given the clinical history of vomiting) aspiration. Neoplastic processes are felt unlikely but cannot be entirely excluded. Especially if the patient is diagnosed with lymphoma, consider CT followup after therapy to confirm resolution. 2. No thoracic adenopathy. 3. Mild cardiomegaly. Coronary artery atherosclerosis. Aortic Atherosclerosis (ICD10-I70.0). 4. Development of small bowel dilatation, suspicious for obstruction at the site of distal small bowel thickening on prior exam. 5. Borderline ascending aortic aneurysm. Recommend annual imaging followup by CTA or MRA. This recommendation follows 2010 ACCF/AHA/AATS/ACR/ASA/SCA/SCAI/SIR/STS/SVM Guidelines for the Diagnosis and Management of Patients with Thoracic Aortic Disease. Circulation. 2010; 121: Q657-Q469. 6. Nonspecific left upper lobe pulmonary nodule.   He is accompanied by his son today. He states he is doing well overall. He reports noticing a difficulty swallowing and nausea/vomiting after eating. He saw his PCP for this and had a CT scan. He was then referred to Dr. [name] and underwent a laparoscopic small bowel resection with resolution of symptoms.  He states he wasn't losing much weight before these symptoms began and lost 15 pounds after.   Pathology showed  follicular lymphoma   On review of systems, pt reports vomiting, constipation, weight loss, swelling and skin rash to the right ankle, and denies SOB, cough, bloody stools, diarrhea, fever, chills, night sweats and any other accompanying symptoms.   [place] is here for a scheduled follow-up of his Grade 3A Follicular Lymphoma. The patient's last visit with [place] was on 04/30/2020. The pt reports that he is doing well overall.  The pt reports no new symptoms or concerns. The pt notes that he now takes Flomax nightly, but notes no other medication changes. The pt has received his COVID vaccines and booster, as well as his annual Flu vaccine.  Lab results today 10/28/2020 of CBC w/diff and CMP is as follows: all values are WNL except for RBC of 3.90, Hgb of 12.1, Hct of 38.4, Glucose of 102, Creatinine of 1.38, Total Bilirubin of 1.38, Total Bilirubin of 1.7, GFR est of 50. 10/28/2020 LDH . Lab Results  Component Value Date   LDH 153 10/28/2020   On review of systems, pt reports intermittent constipation and denies fevers, chills, abdominal pain, SOB, new lumps/bumps, infection issues, and any other symptoms.  MEDICAL HISTORY:  Past Medical History:  Diagnosis Date  . Coronary artery calcification 08/03/2017  . Essential hypertension 08/03/2017  . GERD (gastroesophageal reflux disease)   . HOH (hard of hearing)   . Hyperlipidemia 08/03/2017  . Hypertension   . LBBB (left bundle branch block) 08/03/2017  . Non-Hodgkin lymphoma ([place]) 08/03/2017  . Pneumonia    hx  . Small bowel mass   . Varicose vein of leg 08/03/2017    SURGICAL HISTORY: Past Surgical History:  Procedure Laterality Date  . EYE SURGERY Bilateral 2016   cataracts  . HERNIA REPAIR Bilateral 2008   inguinal  .  LAPAROSCOPIC SMALL BOWEL RESECTION  07/06/2017  . LAPAROSCOPIC SMALL BOWEL RESECTION N/A 07/06/2017   Procedure: LAPAROSCOPIC ASSISTED SMALL BOWEL RESECTION;  Surgeon: Coralie Keens, MD;   Location: [facility];  Service: General;  Laterality: N/A;    SOCIAL HISTORY: Social History   Socioeconomic History  . Marital status: Widowed    Spouse name: Not on file  . Number of children: Not on file  . Years of education: Not on file  . Highest education level: Not on file  Occupational History  . Not on file  Tobacco Use  . Smoking status: Former Smoker    Types: Cigars    Quit date: 06/29/1984    Years since quitting: 36.3  . Smokeless tobacco: Never Used  Vaping Use  . Vaping Use: Never used  Substance and Sexual Activity  . Alcohol use: No  . Drug use: No  . Sexual activity: Not on file  Other Topics Concern  . Not on file  Social History Narrative  . Not on file   Social Determinants of Health   Financial Resource Strain: Not on file  Food Insecurity: Not on file  Transportation Needs: Not on file  Physical Activity: Not on file  Stress: Not on file  Social Connections: Not on file  Intimate Partner Violence: Not on file    FAMILY HISTORY: Family History  Problem Relation Age of Onset  . Alzheimer's disease Mother   . Heart disease Mother   . Heart attack Father     ALLERGIES:  has No Known Allergies.  MEDICATIONS:  Current Outpatient Medications  Medication Sig Dispense Refill  . acetaminophen (TYLENOL) 325 MG tablet Take 2 tablets (650 mg total) by mouth every 6 (six) hours as needed for mild pain or moderate pain.    Marland Kitchen albuterol (PROVENTIL HFA;VENTOLIN HFA) 108 (90 Base) MCG/ACT inhaler Inhale 1 puff every 6 (six) hours as needed into the lungs for wheezing or shortness of breath.    Marland Kitchen aspirin EC 81 MG tablet Take 81 mg daily by mouth.    Marland Kitchen atenolol-chlorthalidone (TENORETIC) 100-25 MG tablet Take 0.5 tablets daily by mouth.  1  . Multiple Vitamins-Minerals (PRESERVISION AREDS 2 PO) Take 1 capsule 2 (two) times daily by mouth.    . pantoprazole (PROTONIX) 40 MG tablet Take 40 mg daily by mouth.  3  . potassium chloride SA (K-DUR,KLOR-CON) 20  MEQ tablet Take 40 mEq daily by mouth.    . simvastatin (ZOCOR) 40 MG tablet Take 1 tablet (40 mg total) by mouth every evening. 30 tablet 5  . triamcinolone cream (KENALOG) 0.1 % Apply 1 application daily as needed topically (for dry skin).     No current facility-administered medications for this visit.    REVIEW OF SYSTEMS:   10 Point review of Systems was done is negative except as noted above.   PHYSICAL EXAMINATION:  ECOG PERFORMANCE STATUS: 1 - Symptomatic but completely ambulatory  .BP 116/63   Pulse 75   Temp (!) 97.5 F (36.4 C) (Tympanic)   Resp 18   Ht 5\' 10"  (1.778 m)   Wt 157 lb (71.2 kg)   SpO2 100%   BMI 22.53 kg/m    GENERAL:alert, in no acute distress and comfortable SKIN: no acute rashes, no significant lesions EYES: conjunctiva are pink and non-injected, sclera anicteric OROPHARYNX: MMM, no exudates, no oropharyngeal erythema or ulceration NECK: supple, no JVD LYMPH:  no palpable lymphadenopathy in the cervical, axillary or inguinal regions LUNGS: clear to auscultation b/l  with normal respiratory effort HEART: regular rate & rhythm ABDOMEN:  normoactive bowel sounds , non tender, not distended. Extremity: no pedal edema PSYCH: alert & oriented x 3 with fluent speech NEURO: no focal motor/sensory deficits  LABORATORY DATA:  I have reviewed the data as listed . CBC Latest Ref Rng & Units 10/28/2020 04/30/2020 10/29/2019  WBC 4.0 - 10.5 K/uL 5.0 5.4 5.1  Hemoglobin 13.0 - 17.0 g/dL 12.1(L) 12.0(L) 12.5(L)  Hematocrit 39.0 - 52.0 % 38.4(L) 37.0(L) 39.1  Platelets 150 - 400 K/uL 196 205 210  HGB 11.9  CMP Latest Ref Rng & Units 10/28/2020 04/30/2020 10/29/2019  Glucose 70 - 99 mg/dL 102(H) 71 97  BUN 8 - 23 mg/dL 15 24(H) 18  Creatinine 0.61 - 1.24 mg/dL 1.38(H) 1.43(H) 1.29(H)  Sodium 135 - 145 mmol/L 144 142 141  Potassium 3.5 - 5.1 mmol/L 4.6 4.0 4.3  Chloride 98 - 111 mmol/L 105 104 103  CO2 22 - 32 mmol/L 31 32 31  Calcium 8.9 - 10.3 mg/dL 9.5 9.6  9.5  Total Protein 6.5 - 8.1 g/dL 7.1 7.1 6.9  Total Bilirubin 0.3 - 1.2 mg/dL 1.7(H) 2.0(H) 2.1(H)  Alkaline Phos 38 - 126 U/L 62 68 69  AST 15 - 41 U/L 19 24 24   ALT 0 - 44 U/L 13 17 16    . Lab Results  Component Value Date   LDH 153 10/28/2020        CT CHEST ABD 06/14/2017 IMPRESSION: 1. Bilateral pulmonary parenchymal opacities, peribronchovascular in distribution. Favor infection or (especially given the clinical history of vomiting) aspiration. Neoplastic processes are felt unlikely but cannot be entirely excluded. Especially if the patient is diagnosed with lymphoma, consider CT followup after therapy to confirm resolution. 2. No thoracic adenopathy. 3. Mild cardiomegaly. Coronary artery atherosclerosis. Aortic Atherosclerosis (ICD10-I70.0). 4. Development of small bowel dilatation, suspicious for obstruction at the site of distal small bowel thickening on prior exam. 5. Borderline ascending aortic aneurysm. Recommend annual imaging followup by CTA or MRA. This recommendation follows 2010 ACCF/AHA/AATS/ACR/ASA/SCA/SCAI/SIR/STS/SVM Guidelines for the Diagnosis and Management of Patients with Thoracic Aortic Disease. Circulation. 2010; 121: E423-N361. 6. Nonspecific left upper lobe pulmonary nodule.   RADIOGRAPHIC STUDIES: I have personally reviewed the radiological images as listed and agreed with the findings in the report. No results found.  ASSESSMENT & PLAN:   Henry Nichols is a wonderful 87 y.o. 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 No type B symptoms S/p Rituxan weekly x 4 doses Rpt PET/CT 11/08/2017 showed Much improved mesenteric mass as described above. Two small mildly hypermetabolic lymph nodes remain. No other areas of adenopathy are identified in the neck, chest, abdomen or pelvis  05/30/18 CT C/A/P revealed he central mesenteric lymph nodes identified on previous  PET studies as hypermetabolic have progressed since the PET-CT of 11/08/2017. No evidence for lymphadenopathy in the chest or pelvis. 2. Left renal cyst. 3.  Aortic Atherosclerois.  12/20/18 CT C/A/P which revealed "Previously seen lymph node in central mesenteric fat which had measured 2.5 x 1.9 cm on the most recent CT has increased in size and is now contiguous with a second index lymph node on the prior exam. No new enlarged lymph nodes are identified. No other change compared to the prior study."  10/16/2019 CT C/A/P ([id number]) ([id number]) revealed no enlarged lymph nodes in the chest or pelvis, normal spleen, mass in small bowel mesentery.   PLAN: -Discussed pt  labwork today, 10/28/2020; blood counts normal, chemistries stable, LDH pending. -Advised pt that soreness on side after waking up is most likely muscle stiffness.  -Advised pt that we will repeat scans in two visits (1 year) unless new symptoms arise. -No lab or clinical evidence of FL recurrence /progression at this time. No indication to begin treatment. Will continue watchful observation.  -The pt will continue to look for constitutional symptoms or new abdominal symptoms and will let me know if he develops any new concerns. -Recommended that the pt continue to eat well, drink at least 48-64 oz of water each day, and walk 20-30 minutes each day.  -Will see back in 6 months with labs.    FOLLOW UP: RTC with Dr Irene Limbo with labs in 6 months   The total time spent in the appointment was 20 minutes and more than 50% was on counseling and direct patient cares.   All of the patient's questions were answered with apparent satisfaction. The patient knows to call the clinic with any problems, questions or concerns.   Sullivan Lone MD [place] AAHIVMS [facility] Hematology/Oncology Physician [facility]  (Office):       [phone] (Work cell):  [phone] (Fax):           [phone]  I, Reinaldo Raddle, am acting as scribe  for Dr. Sullivan Lone, MD.     .I have reviewed the above documentation for accuracy and completeness, and I agree with the above. Brunetta Genera MD

## 2020-10-28 ENCOUNTER — Inpatient Hospital Stay: Payer: Medicare Other | Attending: Hematology | Admitting: Hematology

## 2020-10-28 ENCOUNTER — Other Ambulatory Visit: Payer: Self-pay

## 2020-10-28 ENCOUNTER — Inpatient Hospital Stay: Payer: Medicare Other

## 2020-10-28 VITALS — BP 116/63 | HR 75 | Temp 97.5°F | Resp 18 | Ht 70.0 in | Wt 157.0 lb

## 2020-10-28 DIAGNOSIS — Z87891 Personal history of nicotine dependence: Secondary | ICD-10-CM | POA: Diagnosis not present

## 2020-10-28 DIAGNOSIS — C8233 Follicular lymphoma grade IIIa, intra-abdominal lymph nodes: Secondary | ICD-10-CM | POA: Insufficient documentation

## 2020-10-28 LAB — CMP (CANCER CENTER ONLY)
ALT: 13 U/L (ref 0–44)
AST: 19 U/L (ref 15–41)
Albumin: 3.8 g/dL (ref 3.5–5.0)
Alkaline Phosphatase: 62 U/L (ref 38–126)
Anion gap: 8 (ref 5–15)
BUN: 15 mg/dL (ref 8–23)
CO2: 31 mmol/L (ref 22–32)
Calcium: 9.5 mg/dL (ref 8.9–10.3)
Chloride: 105 mmol/L (ref 98–111)
Creatinine: 1.38 mg/dL — ABNORMAL HIGH (ref 0.61–1.24)
GFR, Estimated: 50 mL/min — ABNORMAL LOW (ref >60)
Glucose, Bld: 102 mg/dL — ABNORMAL HIGH (ref 70–99)
Potassium: 4.6 mmol/L (ref 3.5–5.1)
Sodium: 144 mmol/L (ref 135–145)
Total Bilirubin: 1.7 mg/dL — ABNORMAL HIGH (ref 0.3–1.2)
Total Protein: 7.1 g/dL (ref 6.5–8.1)

## 2020-10-28 LAB — LACTATE DEHYDROGENASE: LDH: 153 U/L (ref 98–192)

## 2020-10-28 LAB — CBC WITH DIFFERENTIAL/PLATELET
Abs Immature Granulocytes: 0.02 10*3/uL (ref 0.00–0.07)
Basophils Absolute: 0.1 10*3/uL (ref 0.0–0.1)
Basophils Relative: 2 %
Eosinophils Absolute: 0.4 10*3/uL (ref 0.0–0.5)
Eosinophils Relative: 7 %
HCT: 38.4 % — ABNORMAL LOW (ref 39.0–52.0)
Hemoglobin: 12.1 g/dL — ABNORMAL LOW (ref 13.0–17.0)
Immature Granulocytes: 0 %
Lymphocytes Relative: 23 %
Lymphs Abs: 1.2 10*3/uL (ref 0.7–4.0)
MCH: 31 pg (ref 26.0–34.0)
MCHC: 31.5 g/dL (ref 30.0–36.0)
MCV: 98.5 fL (ref 80.0–100.0)
Monocytes Absolute: 0.5 10*3/uL (ref 0.1–1.0)
Monocytes Relative: 11 %
Neutro Abs: 2.9 10*3/uL (ref 1.7–7.7)
Neutrophils Relative %: 57 %
Platelets: 196 10*3/uL (ref 150–400)
RBC: 3.9 MIL/uL — ABNORMAL LOW (ref 4.22–5.81)
RDW: 13.8 % (ref 11.5–15.5)
WBC: 5 10*3/uL (ref 4.0–10.5)
nRBC: 0 % (ref 0.0–0.2)

## 2020-10-31 ENCOUNTER — Other Ambulatory Visit: Payer: Self-pay

## 2020-10-31 ENCOUNTER — Encounter (INDEPENDENT_AMBULATORY_CARE_PROVIDER_SITE_OTHER): Payer: Medicare Other | Admitting: Ophthalmology

## 2020-10-31 DIAGNOSIS — H35371 Puckering of macula, right eye: Secondary | ICD-10-CM | POA: Diagnosis not present

## 2020-10-31 DIAGNOSIS — I10 Essential (primary) hypertension: Secondary | ICD-10-CM

## 2020-10-31 DIAGNOSIS — H35033 Hypertensive retinopathy, bilateral: Secondary | ICD-10-CM | POA: Diagnosis not present

## 2020-10-31 DIAGNOSIS — H353132 Nonexudative age-related macular degeneration, bilateral, intermediate dry stage: Secondary | ICD-10-CM | POA: Diagnosis not present

## 2020-10-31 DIAGNOSIS — H43813 Vitreous degeneration, bilateral: Secondary | ICD-10-CM

## 2021-04-30 ENCOUNTER — Inpatient Hospital Stay (HOSPITAL_BASED_OUTPATIENT_CLINIC_OR_DEPARTMENT_OTHER): Payer: Medicare Other | Admitting: Hematology

## 2021-04-30 ENCOUNTER — Other Ambulatory Visit: Payer: Self-pay

## 2021-04-30 ENCOUNTER — Inpatient Hospital Stay: Payer: Medicare Other | Attending: Hematology

## 2021-04-30 VITALS — BP 110/60 | HR 56 | Temp 97.9°F | Resp 17 | Ht 70.0 in | Wt 160.5 lb

## 2021-04-30 DIAGNOSIS — Z9221 Personal history of antineoplastic chemotherapy: Secondary | ICD-10-CM | POA: Diagnosis not present

## 2021-04-30 DIAGNOSIS — Z7982 Long term (current) use of aspirin: Secondary | ICD-10-CM | POA: Diagnosis not present

## 2021-04-30 DIAGNOSIS — I10 Essential (primary) hypertension: Secondary | ICD-10-CM | POA: Diagnosis not present

## 2021-04-30 DIAGNOSIS — C8233 Follicular lymphoma grade IIIa, intra-abdominal lymph nodes: Secondary | ICD-10-CM

## 2021-04-30 DIAGNOSIS — Z8572 Personal history of non-Hodgkin lymphomas: Secondary | ICD-10-CM | POA: Insufficient documentation

## 2021-04-30 DIAGNOSIS — Z87891 Personal history of nicotine dependence: Secondary | ICD-10-CM | POA: Insufficient documentation

## 2021-04-30 DIAGNOSIS — Z79899 Other long term (current) drug therapy: Secondary | ICD-10-CM | POA: Insufficient documentation

## 2021-04-30 LAB — CBC WITH DIFFERENTIAL/PLATELET
Abs Immature Granulocytes: 0.01 10*3/uL (ref 0.00–0.07)
Basophils Absolute: 0.1 10*3/uL (ref 0.0–0.1)
Basophils Relative: 1 %
Eosinophils Absolute: 0.3 10*3/uL (ref 0.0–0.5)
Eosinophils Relative: 5 %
HCT: 34.8 % — ABNORMAL LOW (ref 39.0–52.0)
Hemoglobin: 11.3 g/dL — ABNORMAL LOW (ref 13.0–17.0)
Immature Granulocytes: 0 %
Lymphocytes Relative: 16 %
Lymphs Abs: 0.9 10*3/uL (ref 0.7–4.0)
MCH: 32.2 pg (ref 26.0–34.0)
MCHC: 32.5 g/dL (ref 30.0–36.0)
MCV: 99.1 fL (ref 80.0–100.0)
Monocytes Absolute: 0.7 10*3/uL (ref 0.1–1.0)
Monocytes Relative: 12 %
Neutro Abs: 3.6 10*3/uL (ref 1.7–7.7)
Neutrophils Relative %: 66 %
Platelets: 184 10*3/uL (ref 150–400)
RBC: 3.51 MIL/uL — ABNORMAL LOW (ref 4.22–5.81)
RDW: 13.2 % (ref 11.5–15.5)
WBC: 5.5 10*3/uL (ref 4.0–10.5)
nRBC: 0 % (ref 0.0–0.2)

## 2021-04-30 LAB — CMP (CANCER CENTER ONLY)
ALT: 20 U/L (ref 0–44)
AST: 23 U/L (ref 15–41)
Albumin: 3.6 g/dL (ref 3.5–5.0)
Alkaline Phosphatase: 83 U/L (ref 38–126)
Anion gap: 8 (ref 5–15)
BUN: 15 mg/dL (ref 8–23)
CO2: 29 mmol/L (ref 22–32)
Calcium: 9.4 mg/dL (ref 8.9–10.3)
Chloride: 104 mmol/L (ref 98–111)
Creatinine: 1.28 mg/dL — ABNORMAL HIGH (ref 0.61–1.24)
GFR, Estimated: 55 mL/min — ABNORMAL LOW (ref >60)
Glucose, Bld: 96 mg/dL (ref 70–99)
Potassium: 4 mmol/L (ref 3.5–5.1)
Sodium: 141 mmol/L (ref 135–145)
Total Bilirubin: 1.7 mg/dL — ABNORMAL HIGH (ref 0.3–1.2)
Total Protein: 6.6 g/dL (ref 6.5–8.1)

## 2021-04-30 LAB — LACTATE DEHYDROGENASE: LDH: 187 U/L (ref 98–192)

## 2021-05-06 NOTE — Progress Notes (Signed)
HEMATOLOGY/ONCOLOGY CLINIC NOTE  Date of Service: .04/30/2021   Patient Care Team: [name], MD as PCP - General (Family Medicine)  CHIEF COMPLAINTS/PURPOSE OF CONSULTATION:  F/u for continued management of small bowel follicular lymphoma  HISTORY OF PRESENTING ILLNESS:   Henry Nichols is a wonderful 87 y.o. male who has been referred to [place] by [place] [name], MD for evaluation and management of a small bowel follicular lymphoma.   He presented to ED for abdominal pain and had a CT chest/abd/pelvis on 06/09/2017 with results showing: IMPRESSION: 1. Bilateral pulmonary parenchymal opacities, peribronchovascular in distribution. Favor infection or (especially given the clinical history of vomiting) aspiration. Neoplastic processes are felt unlikely but cannot be entirely excluded. Especially if the patient is diagnosed with lymphoma, consider CT followup after therapy to confirm resolution. 2. No thoracic adenopathy. 3. Mild cardiomegaly. Coronary artery atherosclerosis. Aortic Atherosclerosis (ICD10-I70.0). 4. Development of small bowel dilatation, suspicious for obstruction at the site of distal small bowel thickening on prior exam. 5. Borderline ascending aortic aneurysm. Recommend annual imaging followup by CTA or MRA. This recommendation follows 2010 ACCF/AHA/AATS/ACR/ASA/SCA/SCAI/SIR/STS/SVM Guidelines for the Diagnosis and Management of Patients with Thoracic Aortic Disease. Circulation. 2010; 121: B353-G992. 6. Nonspecific left upper lobe pulmonary nodule.   He is accompanied by his son today. He states he is doing well overall. He reports noticing a difficulty swallowing and nausea/vomiting after eating. He saw his PCP for this and had a CT scan. He was then referred to Henry. [name] and underwent a laparoscopic small bowel resection with resolution of symptoms.  He states he wasn't losing much weight before these symptoms began and lost 15 pounds after.   Pathology  showed follicular lymphoma   On review of systems, pt reports vomiting, constipation, weight loss, swelling and skin rash to the right ankle, and denies SOB, cough, bloody stools, diarrhea, fever, chills, night sweats and any other accompanying symptoms.   Henry Nichols is here for a scheduled follow-up of his Grade 3A Follicular Lymphoma. The patient's last visit with [place] was on 10/28/2020. The pt reports that he is doing well overall. The pt reports no new symptoms or concerns.  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 No fevers no chills no night sweats no unexpected weight loss. No belly pains or belly distention.  Lab results today 04/30/2021 of CBC w/diff and CMP stable  LDH within normal limits at 187.   MEDICAL HISTORY:  Past Medical History:  Diagnosis Date   Coronary artery calcification 08/03/2017   Essential hypertension 08/03/2017   GERD (gastroesophageal reflux disease)    HOH (hard of hearing)    Hyperlipidemia 08/03/2017   Hypertension    LBBB (left bundle branch block) 08/03/2017   Non-Hodgkin lymphoma ([place]) 08/03/2017   Pneumonia    hx   Small bowel mass    Varicose vein of leg 08/03/2017    SURGICAL HISTORY: Past Surgical History:  Procedure Laterality Date   EYE SURGERY Bilateral 2016   cataracts   HERNIA REPAIR Bilateral 2008   inguinal   LAPAROSCOPIC SMALL BOWEL RESECTION  07/06/2017   LAPAROSCOPIC SMALL BOWEL RESECTION N/A 07/06/2017   Procedure: LAPAROSCOPIC ASSISTED SMALL BOWEL RESECTION;  Surgeon: Coralie Keens, MD;  Location: [facility];  Service: General;  Laterality: N/A;    SOCIAL HISTORY: Social History   Socioeconomic History   Marital status: Widowed    Spouse name: Not on file   Number of children: Not on file  Years of education: Not on file   Highest education level: Not on file  Occupational History   Not on file  Tobacco Use   Smoking status: Former    Types: Cigars    Quit date: 06/29/1984     Years since quitting: 36.8   Smokeless tobacco: Never  Vaping Use   Vaping Use: Never used  Substance and Sexual Activity   Alcohol use: No   Drug use: No   Sexual activity: Not on file  Other Topics Concern   Not on file  Social History Narrative   Not on file   Social Determinants of Health   Financial Resource Strain: Not on file  Food Insecurity: Not on file  Transportation Needs: Not on file  Physical Activity: Not on file  Stress: Not on file  Social Connections: Not on file  Intimate Partner Violence: Not on file   .10 Point review of Systems was done is negative except as noted above.   FAMILY HISTORY: Family History  Problem Relation Age of Onset   Alzheimer's disease Mother    Heart disease Mother    Heart attack Father     ALLERGIES:  has No Known Allergies.  MEDICATIONS:  Current Outpatient Medications  Medication Sig Dispense Refill   acetaminophen (TYLENOL) 325 MG tablet Take 2 tablets (650 mg total) by mouth every 6 (six) hours as needed for mild pain or moderate pain.     albuterol (PROVENTIL HFA;VENTOLIN HFA) 108 (90 Base) MCG/ACT inhaler Inhale 1 puff every 6 (six) hours as needed into the lungs for wheezing or shortness of breath.     aspirin EC 81 MG tablet Take 81 mg daily by mouth.     atenolol-chlorthalidone (TENORETIC) 100-25 MG tablet Take 0.5 tablets daily by mouth.  1   Multiple Vitamins-Minerals (PRESERVISION AREDS 2 PO) Take 1 capsule 2 (two) times daily by mouth.     potassium chloride SA (K-DUR,KLOR-CON) 20 MEQ tablet Take 40 mEq daily by mouth.     simvastatin (ZOCOR) 40 MG tablet Take 1 tablet (40 mg total) by mouth every evening. 30 tablet 5   triamcinolone cream (KENALOG) 0.1 % Apply 1 application daily as needed topically (for dry skin).     pantoprazole (PROTONIX) 40 MG tablet Take 40 mg daily by mouth. (Patient not taking: Reported on 04/30/2021)  3   No current facility-administered medications for this visit.    REVIEW OF  SYSTEMS:   10 Point review of Systems was done is negative except as noted above.   PHYSICAL EXAMINATION:  ECOG PERFORMANCE STATUS: 1 - Symptomatic but completely ambulatory  .BP 110/60 (BP Location: Left Arm, Patient Position: Sitting)   Pulse (!) 56   Temp 97.9 F (36.6 C) (Oral)   Resp 17   Ht 5\' 10"  (1.778 m)   Wt 160 lb 8 oz (72.8 kg)   SpO2 99%   BMI 23.03 kg/m   . GENERAL:alert, in no acute distress and comfortable SKIN: no acute rashes, no significant lesions EYES: conjunctiva are pink and non-injected, sclera anicteric OROPHARYNX: MMM, no exudates, no oropharyngeal erythema or ulceration NECK: supple, no JVD LYMPH:  no palpable lymphadenopathy in the cervical, axillary or inguinal regions LUNGS: clear to auscultation b/l with normal respiratory effort HEART: regular rate & rhythm ABDOMEN:  normoactive bowel sounds , non tender, not distended. Extremity: no pedal edema PSYCH: alert & oriented x 3 with fluent speech NEURO: no focal motor/sensory deficits  LABORATORY DATA:  I have reviewed  the data as listed . CBC Latest Ref Rng & Units 04/30/2021 10/28/2020 04/30/2020  WBC 4.0 - 10.5 K/uL 5.5 5.0 5.4  Hemoglobin 13.0 - 17.0 g/dL 11.3(L) 12.1(L) 12.0(L)  Hematocrit 39.0 - 52.0 % 34.8(L) 38.4(L) 37.0(L)  Platelets 150 - 400 K/uL 184 196 205  HGB 11.9  CMP Latest Ref Rng & Units 04/30/2021 10/28/2020 04/30/2020  Glucose 70 - 99 mg/dL 96 102(H) 71  BUN 8 - 23 mg/dL 15 15 24(H)  Creatinine 0.61 - 1.24 mg/dL 1.28(H) 1.38(H) 1.43(H)  Sodium 135 - 145 mmol/L 141 144 142  Potassium 3.5 - 5.1 mmol/L 4.0 4.6 4.0  Chloride 98 - 111 mmol/L 104 105 104  CO2 22 - 32 mmol/L 29 31 32  Calcium 8.9 - 10.3 mg/dL 9.4 9.5 9.6  Total Protein 6.5 - 8.1 g/dL 6.6 7.1 7.1  Total Bilirubin 0.3 - 1.2 mg/dL 1.7(H) 1.7(H) 2.0(H)  Alkaline Phos 38 - 126 U/L 83 62 68  AST 15 - 41 U/L 23 19 24   ALT 0 - 44 U/L 20 13 17    . Lab Results  Component Value Date   LDH 187 04/30/2021        CT  CHEST ABD 06/14/2017 IMPRESSION: 1. Bilateral pulmonary parenchymal opacities, peribronchovascular in distribution. Favor infection or (especially given the clinical history of vomiting) aspiration. Neoplastic processes are felt unlikely but cannot be entirely excluded. Especially if the patient is diagnosed with lymphoma, consider CT followup after therapy to confirm resolution. 2. No thoracic adenopathy. 3. Mild cardiomegaly. Coronary artery atherosclerosis. Aortic Atherosclerosis (ICD10-I70.0). 4. Development of small bowel dilatation, suspicious for obstruction at the site of distal small bowel thickening on prior exam. 5. Borderline ascending aortic aneurysm. Recommend annual imaging followup by CTA or MRA. This recommendation follows 2010 ACCF/AHA/AATS/ACR/ASA/SCA/SCAI/SIR/STS/SVM Guidelines for the Diagnosis and Management of Patients with Thoracic Aortic Disease. Circulation. 2010; 121: E751-Z001. 6. Nonspecific left upper lobe pulmonary nodule.   RADIOGRAPHIC STUDIES: I have personally reviewed the radiological images as listed and agreed with the findings in the report. No results found.  ASSESSMENT & PLAN:   Henry Nichols is a wonderful 87 y.o. 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 No type B symptoms S/p Rituxan weekly x 4 doses Rpt PET/CT 11/08/2017 showed Much improved mesenteric mass as described above. Two small mildly hypermetabolic lymph nodes remain. No other areas of adenopathy are identified in the neck, chest, abdomen or pelvis  05/30/18 CT C/A/P revealed he central mesenteric lymph nodes identified on previous PET studies as hypermetabolic have progressed since the PET-CT of 11/08/2017. No evidence for lymphadenopathy in the chest or pelvis. 2. Left renal cyst. 3.  Aortic Atherosclerois.  12/20/18 CT C/A/P which revealed "Previously seen lymph node in central mesenteric fat which  had measured 2.5 x 1.9 cm on the most recent CT has increased in size and is now contiguous with a second index lymph node on the prior exam. No new enlarged lymph nodes are identified. No other change compared to the prior study."  10/16/2019 CT C/A/P ([id number]) ([id number]) revealed no enlarged lymph nodes in the chest or pelvis, normal spleen, mass in small bowel mesentery.   PLAN: -Discussed pt labwork today, 04/30/2021; blood counts normal, chemistries stable, LDH within normal limits -No clinical or lab evidence of follicular lymphoma progression at this time. -Advised pt that we will repeat scans in two visits (1 year) unless new symptoms arise. -No lab or clinical  evidence of FL recurrence /progression at this time. No indication to begin treatment. Will continue watchful observation.  -The pt will continue to look for constitutional symptoms or new abdominal symptoms and will let me know if he develops any new concerns. -Recommended that the pt continue to eat well, drink at least 48-64 oz of water each day, and walk 20-30 minutes each day.  -Will see back in 6 months with labs.    FOLLOW UP: RTC with Henry Nichols with labs in 6 months  . The total time spent in the appointment was 20 minutes and more than 50% was on counseling and direct patient cares.    All of the patient's questions were answered with apparent satisfaction. The patient knows to call the clinic with any problems, questions or concerns.   Henry Lone MD [place] AAHIVMS [facility] Hematology/Oncology Physician [facility]

## 2021-10-23 ENCOUNTER — Other Ambulatory Visit: Payer: Self-pay

## 2021-10-23 ENCOUNTER — Encounter (INDEPENDENT_AMBULATORY_CARE_PROVIDER_SITE_OTHER): Payer: Medicare Other | Admitting: Ophthalmology

## 2021-10-23 DIAGNOSIS — H43813 Vitreous degeneration, bilateral: Secondary | ICD-10-CM

## 2021-10-23 DIAGNOSIS — I10 Essential (primary) hypertension: Secondary | ICD-10-CM | POA: Diagnosis not present

## 2021-10-23 DIAGNOSIS — H35033 Hypertensive retinopathy, bilateral: Secondary | ICD-10-CM | POA: Diagnosis not present

## 2021-10-23 DIAGNOSIS — H353132 Nonexudative age-related macular degeneration, bilateral, intermediate dry stage: Secondary | ICD-10-CM

## 2021-10-23 DIAGNOSIS — H35371 Puckering of macula, right eye: Secondary | ICD-10-CM

## 2021-10-27 ENCOUNTER — Other Ambulatory Visit: Payer: Self-pay

## 2021-10-27 DIAGNOSIS — C8233 Follicular lymphoma grade IIIa, intra-abdominal lymph nodes: Secondary | ICD-10-CM

## 2021-10-29 ENCOUNTER — Inpatient Hospital Stay (HOSPITAL_BASED_OUTPATIENT_CLINIC_OR_DEPARTMENT_OTHER): Payer: Medicare Other | Admitting: Hematology

## 2021-10-29 ENCOUNTER — Other Ambulatory Visit: Payer: Self-pay

## 2021-10-29 ENCOUNTER — Inpatient Hospital Stay: Payer: Medicare Other | Attending: Hematology

## 2021-10-29 VITALS — BP 132/74 | HR 51 | Temp 97.7°F | Resp 18 | Wt 155.1 lb

## 2021-10-29 DIAGNOSIS — C8233 Follicular lymphoma grade IIIa, intra-abdominal lymph nodes: Secondary | ICD-10-CM | POA: Diagnosis not present

## 2021-10-29 DIAGNOSIS — Z8572 Personal history of non-Hodgkin lymphomas: Secondary | ICD-10-CM | POA: Diagnosis present

## 2021-10-29 LAB — CMP (CANCER CENTER ONLY)
ALT: 33 U/L (ref 0–44)
AST: 44 U/L — ABNORMAL HIGH (ref 15–41)
Albumin: 3.7 g/dL (ref 3.5–5.0)
Alkaline Phosphatase: 64 U/L (ref 38–126)
Anion gap: 4 — ABNORMAL LOW (ref 5–15)
BUN: 17 mg/dL (ref 8–23)
CO2: 33 mmol/L — ABNORMAL HIGH (ref 22–32)
Calcium: 9.3 mg/dL (ref 8.9–10.3)
Chloride: 106 mmol/L (ref 98–111)
Creatinine: 1.19 mg/dL (ref 0.61–1.24)
GFR, Estimated: 59 mL/min — ABNORMAL LOW (ref >60)
Glucose, Bld: 101 mg/dL — ABNORMAL HIGH (ref 70–99)
Potassium: 4.1 mmol/L (ref 3.5–5.1)
Sodium: 143 mmol/L (ref 135–145)
Total Bilirubin: 1.1 mg/dL (ref 0.3–1.2)
Total Protein: 6.6 g/dL (ref 6.5–8.1)

## 2021-10-29 LAB — CBC WITH DIFFERENTIAL (CANCER CENTER ONLY)
Abs Immature Granulocytes: 0.03 10*3/uL (ref 0.00–0.07)
Basophils Absolute: 0.1 10*3/uL (ref 0.0–0.1)
Basophils Relative: 1 %
Eosinophils Absolute: 0.4 10*3/uL (ref 0.0–0.5)
Eosinophils Relative: 7 %
HCT: 35.5 % — ABNORMAL LOW (ref 39.0–52.0)
Hemoglobin: 11.4 g/dL — ABNORMAL LOW (ref 13.0–17.0)
Immature Granulocytes: 1 %
Lymphocytes Relative: 29 %
Lymphs Abs: 1.6 10*3/uL (ref 0.7–4.0)
MCH: 31.8 pg (ref 26.0–34.0)
MCHC: 32.1 g/dL (ref 30.0–36.0)
MCV: 99.2 fL (ref 80.0–100.0)
Monocytes Absolute: 0.5 10*3/uL (ref 0.1–1.0)
Monocytes Relative: 10 %
Neutro Abs: 2.9 10*3/uL (ref 1.7–7.7)
Neutrophils Relative %: 52 %
Platelet Count: 258 10*3/uL (ref 150–400)
RBC: 3.58 MIL/uL — ABNORMAL LOW (ref 4.22–5.81)
RDW: 13.3 % (ref 11.5–15.5)
WBC Count: 5.5 10*3/uL (ref 4.0–10.5)
nRBC: 0 % (ref 0.0–0.2)

## 2021-10-29 LAB — LACTATE DEHYDROGENASE: LDH: 161 U/L (ref 98–192)

## 2021-10-29 NOTE — Progress Notes (Signed)
? ? ?HEMATOLOGY/ONCOLOGY CLINIC NOTE ? ?Date of Service: 10/29/2021 ? ?Patient Care Team: ?[name], MD as PCP - General (Family Medicine) ? ?CHIEF COMPLAINTS/PURPOSE OF CONSULTATION:  ?F/u for continued management of small bowel follicular lymphoma ? ?HISTORY OF PRESENTING ILLNESS:  ? ?Please see previous note for details on initial presentation ? ?INTERVAL HISTORY  ? ?Henry Nichols is here for a scheduled follow-up of his Grade 3A Follicular Lymphoma. The patient's last visit with [place] was on 04/30/2021. The pt reports that he is doing well overall. ?The pt reports no new symptoms or concerns.  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 ? ?He reports that he is able to notice his aging and feels that he is not able to work as hard as he would like. We discussed that his health is great despite his advanced age.  ? ?No new or acute symptoms. No abdominal pain, diarrhea, or bowel changes. No blood or mucus in stool. ? ?No new lumps, bumps, or lesions. ? ?No fever, chills, or night sweats.  ? ?We discussed patient should increase water intake. ? ?Lab results today 10/29/2021 of CBC w/diff and CMP stable  LDH within normal limits at 161. ? ? ?MEDICAL HISTORY:  ?Past Medical History:  ?Diagnosis Date  ? Coronary artery calcification 08/03/2017  ? Essential hypertension 08/03/2017  ? GERD (gastroesophageal reflux disease)   ? HOH (hard of hearing)   ? Hyperlipidemia 08/03/2017  ? Hypertension   ? LBBB (left bundle branch block) 08/03/2017  ? Non-Hodgkin lymphoma ([place]) 08/03/2017  ? Pneumonia   ? hx  ? Small bowel mass   ? Varicose vein of leg 08/03/2017  ? ? ?SURGICAL HISTORY: ?Past Surgical History:  ?Procedure Laterality Date  ? EYE SURGERY Bilateral 2016  ? cataracts  ? HERNIA REPAIR Bilateral 2008  ? inguinal  ? LAPAROSCOPIC SMALL BOWEL RESECTION  07/06/2017  ? LAPAROSCOPIC SMALL BOWEL RESECTION N/A 07/06/2017  ? Procedure: LAPAROSCOPIC ASSISTED SMALL BOWEL RESECTION;  Surgeon: Coralie Keens, MD;  Location: [facility];  Service: General;  Laterality: N/A;  ? ? ?SOCIAL HISTORY: ?Social History  ? ?Socioeconomic History  ? Marital status: Widowed  ?  Spouse name: Not on file  ? Number of children: Not on file  ? Years of education: Not on file  ? Highest education level: Not on file  ?Occupational History  ? Not on file  ?Tobacco Use  ? Smoking status: Former  ?  Types: Cigars  ?  Quit date: 06/29/1984  ?  Years since quitting: 37.3  ? Smokeless tobacco: Never  ?Vaping Use  ? Vaping Use: Never used  ?Substance and Sexual Activity  ? Alcohol use: No  ? Drug use: No  ? Sexual activity: Not on file  ?Other Topics Concern  ? Not on file  ?Social History Narrative  ? Not on file  ? ?Social Determinants of Health  ? ?Financial Resource Strain: Not on file  ?Food Insecurity: Not on file  ?Transportation Needs: Not on file  ?Physical Activity: Not on file  ?Stress: Not on file  ?Social Connections: Not on file  ?Intimate Partner Violence: Not on file  ? ?FAMILY HISTORY: ?Family History  ?Problem Relation Age of Onset  ? Alzheimer's disease Mother   ? Heart disease Mother   ? Heart attack Father   ? ? ?ALLERGIES:  has No Known Allergies. ? ?MEDICATIONS:  ?Current Outpatient Medications  ?Medication Sig Dispense Refill  ? acetaminophen (TYLENOL) 325 MG tablet Take 2  tablets (650 mg total) by mouth every 6 (six) hours as needed for mild pain or moderate pain.    ? albuterol (PROVENTIL HFA;VENTOLIN HFA) 108 (90 Base) MCG/ACT inhaler Inhale 1 puff every 6 (six) hours as needed into the lungs for wheezing or shortness of breath.    ? aspirin EC 81 MG tablet Take 81 mg daily by mouth.    ? atenolol-chlorthalidone (TENORETIC) 100-25 MG tablet Take 0.5 tablets daily by mouth.  1  ? Multiple Vitamins-Minerals (PRESERVISION AREDS 2 PO) Take 1 capsule 2 (two) times daily by mouth.    ? potassium chloride SA (K-DUR,KLOR-CON) 20 MEQ tablet Take 40 mEq daily by mouth.    ? simvastatin (ZOCOR) 40 MG tablet Take 1 tablet (40  mg total) by mouth every evening. 30 tablet 5  ? triamcinolone cream (KENALOG) 0.1 % Apply 1 application daily as needed topically (for dry skin).    ? pantoprazole (PROTONIX) 40 MG tablet Take 40 mg daily by mouth. (Patient not taking: Reported on 04/30/2021)  3  ? ?No current facility-administered medications for this visit.  ? ? ?REVIEW OF SYSTEMS:   ?10 Point review of Systems was done is negative except as noted above.  ? ?PHYSICAL EXAMINATION:  ?ECOG PERFORMANCE STATUS: 1 - Symptomatic but completely ambulatory ? .BP 132/74   Pulse (!) 51   Temp 97.7 ?F (36.5 ?C)   Resp 18   Wt 155 lb 1.6 oz (70.4 kg)   SpO2 100%   BMI 22.25 kg/m?   ? ?GENERAL:alert, in no acute distress and comfortable ?SKIN: no acute rashes, no significant lesions ?EYES: conjunctiva are pink and non-injected, sclera anicteric ?NECK: supple, no JVD ?LYMPH:  no palpable lymphadenopathy in the cervical, axillary or inguinal regions ?LUNGS: clear to auscultation b/l with normal respiratory effort ?HEART: regular rate & rhythm ?ABDOMEN:  normoactive bowel sounds , non tender, not distended. ?Extremity: no pedal edema ?PSYCH: alert & oriented x 3 with fluent speech ?NEURO: no focal motor/sensory deficits ? ?LABORATORY DATA:  ?I have reviewed the data as listed ?Marland Kitchen ?CBC Latest Ref Rng & Units 10/29/2021 04/30/2021 10/28/2020  ?WBC 4.0 - 10.5 K/uL 5.5 5.5 5.0  ?Hemoglobin 13.0 - 17.0 g/dL 11.4(L) 11.3(L) 12.1(L)  ?Hematocrit 39.0 - 52.0 % 35.5(L) 34.8(L) 38.4(L)  ?Platelets 150 - 400 K/uL 258 184 196  ? ? ?CMP Latest Ref Rng & Units 10/29/2021 04/30/2021 10/28/2020  ?Glucose 70 - 99 mg/dL 101(H) 96 102(H)  ?BUN 8 - 23 mg/dL '17 15 15  '$ ?Creatinine 0.61 - 1.24 mg/dL 1.19 1.28(H) 1.38(H)  ?Sodium 135 - 145 mmol/L 143 141 144  ?Potassium 3.5 - 5.1 mmol/L 4.1 4.0 4.6  ?Chloride 98 - 111 mmol/L 106 104 105  ?CO2 22 - 32 mmol/L 33(H) 29 31  ?Calcium 8.9 - 10.3 mg/dL 9.3 9.4 9.5  ?Total Protein 6.5 - 8.1 g/dL 6.6 6.6 7.1  ?Total Bilirubin 0.3 - 1.2 mg/dL 1.1  1.7(H) 1.7(H)  ?Alkaline Phos 38 - 126 U/L 64 83 62  ?AST 15 - 41 U/L 44(H) 23 19  ?ALT 0 - 44 U/L 33 20 13  ? ?. ?Lab Results  ?Component Value Date  ? LDH 161 10/29/2021  ? ? ? ?  ? ?CT CHEST ABD 06/14/2017 ?IMPRESSION: ?1. Bilateral pulmonary parenchymal opacities, peribronchovascular in ?distribution. Favor infection or (especially given the clinical ?history of vomiting) aspiration. Neoplastic processes are felt ?unlikely but cannot be entirely excluded. Especially if the patient ?is diagnosed with lymphoma, consider CT followup after therapy  to ?confirm resolution. ?2. No thoracic adenopathy. ?3. Mild cardiomegaly. Coronary artery atherosclerosis. Aortic ?Atherosclerosis (ICD10-I70.0). ?4. Development of small bowel dilatation, suspicious for obstruction ?at the site of distal small bowel thickening on prior exam. ?5. Borderline ascending aortic aneurysm. Recommend annual imaging ?followup by CTA or MRA. This recommendation follows 2010 ?ACCF/AHA/AATS/ACR/ASA/SCA/SCAI/SIR/STS/SVM Guidelines for the ?Diagnosis and Management of Patients with Thoracic Aortic Disease. ?Circulation. 2010; 121: E334-D568. ?6. Nonspecific left upper lobe pulmonary nodule. ? ? ?RADIOGRAPHIC STUDIES: ?I have personally reviewed the radiological images as listed and agreed with the findings in the report. ?No results found. ? ?ASSESSMENT & PLAN:  ? ?ALTER MOSS is a wonderful 88 y.o. male with  ? 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 ?No type B symptoms ?S/p Rituxan weekly x 4 doses ?Rpt PET/CT 11/08/2017 showed Much improved mesenteric mass as described above. Two small mildly hypermetabolic lymph nodes remain. No other areas of adenopathy are identified in the neck, chest, abdomen or pelvis ? ?05/30/18 CT C/A/P revealed he central mesenteric lymph nodes identified on previous PET studies as hypermetabolic have progressed since the PET-CT of 11/08/2017. No  evidence for lymphadenopathy in the chest or pelvis. 2. Left renal cyst. 3.  Aortic Atherosclerois. ? ?12/20/18 CT C/A/P which revealed "Previously seen lymph node in central mesenteric fat which had measured 2.5 x 1.9

## 2021-10-30 ENCOUNTER — Encounter (INDEPENDENT_AMBULATORY_CARE_PROVIDER_SITE_OTHER): Payer: Medicare Other | Admitting: Ophthalmology

## 2021-11-02 ENCOUNTER — Telehealth: Payer: Self-pay | Admitting: Hematology

## 2021-11-02 NOTE — Telephone Encounter (Signed)
Scheduled follow-up appointment per 3/16 los. Patient is aware. Mailed calendar. ?

## 2022-01-05 ENCOUNTER — Other Ambulatory Visit: Payer: Self-pay | Admitting: Family Medicine

## 2022-01-05 DIAGNOSIS — N5089 Other specified disorders of the male genital organs: Secondary | ICD-10-CM

## 2022-01-18 ENCOUNTER — Ambulatory Visit
Admission: RE | Admit: 2022-01-18 | Discharge: 2022-01-18 | Disposition: A | Discharge status: Home / Self Care | Payer: Medicare Other | Source: Ambulatory Visit | Attending: Family Medicine | Admitting: Family Medicine

## 2022-01-18 DIAGNOSIS — N5089 Other specified disorders of the male genital organs: Secondary | ICD-10-CM

## 2022-01-20 ENCOUNTER — Other Ambulatory Visit: Payer: Self-pay | Admitting: Family Medicine

## 2022-01-20 DIAGNOSIS — N452 Orchitis: Secondary | ICD-10-CM

## 2022-01-23 ENCOUNTER — Ambulatory Visit
Admission: RE | Admit: 2022-01-23 | Discharge: 2022-01-23 | Disposition: A | Discharge status: Home / Self Care | Payer: Medicare Other | Source: Ambulatory Visit | Attending: Family Medicine | Admitting: Family Medicine

## 2022-01-23 DIAGNOSIS — N452 Orchitis: Secondary | ICD-10-CM

## 2022-01-23 MED ORDER — GADOBENATE DIMEGLUMINE 529 MG/ML IV SOLN
15.0000 mL | Freq: Once | INTRAVENOUS | Status: AC | PRN
  Administered 2022-01-23: 15 mL via INTRAVENOUS

## 2022-01-26 ENCOUNTER — Other Ambulatory Visit: Payer: Self-pay | Admitting: Family Medicine

## 2022-02-08 ENCOUNTER — Other Ambulatory Visit: Payer: Self-pay

## 2022-02-08 ENCOUNTER — Inpatient Hospital Stay (HOSPITAL_COMMUNITY)
Admission: EM | Admit: 2022-02-08 | Discharge: 2022-02-14 | DRG: 291 | Disposition: A | Discharge status: Home / Self Care | Payer: Medicare Other | Attending: Internal Medicine | Admitting: Internal Medicine

## 2022-02-08 ENCOUNTER — Emergency Department (HOSPITAL_COMMUNITY): Payer: Medicare Other

## 2022-02-08 DIAGNOSIS — J810 Acute pulmonary edema: Secondary | ICD-10-CM | POA: Diagnosis not present

## 2022-02-08 DIAGNOSIS — I4891 Unspecified atrial fibrillation: Secondary | ICD-10-CM | POA: Diagnosis not present

## 2022-02-08 DIAGNOSIS — I509 Heart failure, unspecified: Secondary | ICD-10-CM | POA: Diagnosis not present

## 2022-02-08 DIAGNOSIS — N179 Acute kidney failure, unspecified: Secondary | ICD-10-CM | POA: Diagnosis present

## 2022-02-08 DIAGNOSIS — I5033 Acute on chronic diastolic (congestive) heart failure: Secondary | ICD-10-CM | POA: Diagnosis not present

## 2022-02-08 DIAGNOSIS — I480 Paroxysmal atrial fibrillation: Secondary | ICD-10-CM | POA: Diagnosis present

## 2022-02-08 DIAGNOSIS — I2781 Cor pulmonale (chronic): Secondary | ICD-10-CM | POA: Diagnosis present

## 2022-02-08 DIAGNOSIS — N183 Chronic kidney disease, stage 3 unspecified: Secondary | ICD-10-CM | POA: Insufficient documentation

## 2022-02-08 DIAGNOSIS — I5043 Acute on chronic combined systolic (congestive) and diastolic (congestive) heart failure: Secondary | ICD-10-CM | POA: Diagnosis present

## 2022-02-08 DIAGNOSIS — Z87891 Personal history of nicotine dependence: Secondary | ICD-10-CM

## 2022-02-08 DIAGNOSIS — E7800 Pure hypercholesterolemia, unspecified: Secondary | ICD-10-CM

## 2022-02-08 DIAGNOSIS — I4892 Unspecified atrial flutter: Secondary | ICD-10-CM | POA: Diagnosis present

## 2022-02-08 DIAGNOSIS — Z7982 Long term (current) use of aspirin: Secondary | ICD-10-CM

## 2022-02-08 DIAGNOSIS — Z79899 Other long term (current) drug therapy: Secondary | ICD-10-CM

## 2022-02-08 DIAGNOSIS — I700 Atherosclerosis of aorta: Secondary | ICD-10-CM | POA: Diagnosis present

## 2022-02-08 DIAGNOSIS — I10 Essential (primary) hypertension: Secondary | ICD-10-CM | POA: Diagnosis present

## 2022-02-08 DIAGNOSIS — R0902 Hypoxemia: Secondary | ICD-10-CM

## 2022-02-08 DIAGNOSIS — I248 Other forms of acute ischemic heart disease: Secondary | ICD-10-CM | POA: Diagnosis present

## 2022-02-08 DIAGNOSIS — C8230 Follicular lymphoma grade IIIa, unspecified site: Secondary | ICD-10-CM | POA: Diagnosis present

## 2022-02-08 DIAGNOSIS — I447 Left bundle-branch block, unspecified: Secondary | ICD-10-CM | POA: Diagnosis present

## 2022-02-08 DIAGNOSIS — E785 Hyperlipidemia, unspecified: Secondary | ICD-10-CM | POA: Diagnosis present

## 2022-02-08 DIAGNOSIS — K219 Gastro-esophageal reflux disease without esophagitis: Secondary | ICD-10-CM | POA: Diagnosis present

## 2022-02-08 DIAGNOSIS — I130 Hypertensive heart and chronic kidney disease with heart failure and stage 1 through stage 4 chronic kidney disease, or unspecified chronic kidney disease: Principal | ICD-10-CM | POA: Diagnosis present

## 2022-02-08 DIAGNOSIS — N189 Chronic kidney disease, unspecified: Secondary | ICD-10-CM | POA: Diagnosis present

## 2022-02-08 DIAGNOSIS — N1831 Chronic kidney disease, stage 3a: Secondary | ICD-10-CM | POA: Diagnosis present

## 2022-02-08 DIAGNOSIS — Z8249 Family history of ischemic heart disease and other diseases of the circulatory system: Secondary | ICD-10-CM

## 2022-02-08 DIAGNOSIS — C8590 Non-Hodgkin lymphoma, unspecified, unspecified site: Secondary | ICD-10-CM | POA: Diagnosis present

## 2022-02-08 DIAGNOSIS — E876 Hypokalemia: Secondary | ICD-10-CM | POA: Diagnosis present

## 2022-02-08 DIAGNOSIS — I2510 Atherosclerotic heart disease of native coronary artery without angina pectoris: Secondary | ICD-10-CM | POA: Diagnosis present

## 2022-02-08 DIAGNOSIS — J90 Pleural effusion, not elsewhere classified: Secondary | ICD-10-CM

## 2022-02-08 DIAGNOSIS — J449 Chronic obstructive pulmonary disease, unspecified: Secondary | ICD-10-CM | POA: Diagnosis present

## 2022-02-08 LAB — CBC WITH DIFFERENTIAL/PLATELET
Abs Immature Granulocytes: 0.01 10*3/uL (ref 0.00–0.07)
Basophils Absolute: 0.1 10*3/uL (ref 0.0–0.1)
Basophils Relative: 1 %
Eosinophils Absolute: 0.1 10*3/uL (ref 0.0–0.5)
Eosinophils Relative: 2 %
HCT: 37.7 % — ABNORMAL LOW (ref 39.0–52.0)
Hemoglobin: 12.1 g/dL — ABNORMAL LOW (ref 13.0–17.0)
Immature Granulocytes: 0 %
Lymphocytes Relative: 22 %
Lymphs Abs: 1.1 10*3/uL (ref 0.7–4.0)
MCH: 32.3 pg (ref 26.0–34.0)
MCHC: 32.1 g/dL (ref 30.0–36.0)
MCV: 100.5 fL — ABNORMAL HIGH (ref 80.0–100.0)
Monocytes Absolute: 0.5 10*3/uL (ref 0.1–1.0)
Monocytes Relative: 10 %
Neutro Abs: 3.3 10*3/uL (ref 1.7–7.7)
Neutrophils Relative %: 65 %
Platelets: 282 10*3/uL (ref 150–400)
RBC: 3.75 MIL/uL — ABNORMAL LOW (ref 4.22–5.81)
RDW: 15.3 % (ref 11.5–15.5)
WBC: 5.1 10*3/uL (ref 4.0–10.5)
nRBC: 0 % (ref 0.0–0.2)

## 2022-02-08 LAB — BRAIN NATRIURETIC PEPTIDE: B Natriuretic Peptide: 598.9 pg/mL — ABNORMAL HIGH (ref 0.0–100.0)

## 2022-02-08 LAB — MAGNESIUM: Magnesium: 2.1 mg/dL (ref 1.7–2.4)

## 2022-02-08 LAB — COMPREHENSIVE METABOLIC PANEL
ALT: 22 U/L (ref 0–44)
AST: 31 U/L (ref 15–41)
Albumin: 3.1 g/dL — ABNORMAL LOW (ref 3.5–5.0)
Alkaline Phosphatase: 74 U/L (ref 38–126)
Anion gap: 7 (ref 5–15)
BUN: 13 mg/dL (ref 8–23)
CO2: 28 mmol/L (ref 22–32)
Calcium: 9.1 mg/dL (ref 8.9–10.3)
Chloride: 104 mmol/L (ref 98–111)
Creatinine, Ser: 1.44 mg/dL — ABNORMAL HIGH (ref 0.61–1.24)
GFR, Estimated: 47 mL/min — ABNORMAL LOW (ref >60)
Glucose, Bld: 115 mg/dL — ABNORMAL HIGH (ref 70–99)
Potassium: 4 mmol/L (ref 3.5–5.1)
Sodium: 139 mmol/L (ref 135–145)
Total Bilirubin: 1.6 mg/dL — ABNORMAL HIGH (ref 0.3–1.2)
Total Protein: 6.5 g/dL (ref 6.5–8.1)

## 2022-02-08 LAB — TROPONIN I (HIGH SENSITIVITY)
Troponin I (High Sensitivity): 31 ng/L — ABNORMAL HIGH (ref <18)
Troponin I (High Sensitivity): 30 ng/L — ABNORMAL HIGH (ref <18)

## 2022-02-08 MED ORDER — ACETAMINOPHEN 650 MG RE SUPP: 650.0000 mg | Freq: Four times a day (QID) | RECTAL | Status: DC | PRN

## 2022-02-08 MED ORDER — PROSIGHT PO TABS
1.0000 | ORAL_TABLET | Freq: Every day | ORAL | Status: DC
  Administered 2022-02-08 – 2022-02-14 (×7): 1 via ORAL
  Filled 2022-02-08 (×8): qty 1

## 2022-02-08 MED ORDER — PRESERVISION AREDS 2 PO CAPS: 1.0000 | ORAL_CAPSULE | Freq: Two times a day (BID) | ORAL | Status: DC

## 2022-02-08 MED ORDER — APIXABAN 5 MG PO TABS
5.0000 mg | ORAL_TABLET | Freq: Two times a day (BID) | ORAL | Status: DC
  Administered 2022-02-08 – 2022-02-14 (×11): 5 mg via ORAL
  Filled 2022-02-08 (×12): qty 1

## 2022-02-08 MED ORDER — SIMVASTATIN 20 MG PO TABS
40.0000 mg | ORAL_TABLET | Freq: Every evening | ORAL | Status: DC
  Administered 2022-02-08: 40 mg via ORAL
  Filled 2022-02-08: qty 2

## 2022-02-08 MED ORDER — ACETAMINOPHEN 325 MG PO TABS
650.0000 mg | ORAL_TABLET | Freq: Four times a day (QID) | ORAL | Status: DC | PRN
  Filled 2022-02-08: qty 2

## 2022-02-08 MED ORDER — METOPROLOL TARTRATE 25 MG PO TABS
25.0000 mg | ORAL_TABLET | Freq: Two times a day (BID) | ORAL | Status: DC
Start: 2022-02-08 — End: 2022-02-10
  Administered 2022-02-08 – 2022-02-09 (×3): 25 mg via ORAL
  Filled 2022-02-08 (×4): qty 1

## 2022-02-08 MED ORDER — SODIUM CHLORIDE 0.9 % IV SOLN: 250.0000 mL | INTRAVENOUS | Status: DC | PRN

## 2022-02-08 MED ORDER — SODIUM CHLORIDE 0.9% FLUSH: 3.0000 mL | INTRAVENOUS | Status: DC | PRN

## 2022-02-08 MED ORDER — FUROSEMIDE 10 MG/ML IJ SOLN
40.0000 mg | Freq: Once | INTRAMUSCULAR | Status: AC
  Administered 2022-02-08: 40 mg via INTRAVENOUS
  Filled 2022-02-08: qty 4

## 2022-02-08 MED ORDER — FUROSEMIDE 10 MG/ML IJ SOLN
40.0000 mg | Freq: Two times a day (BID) | INTRAMUSCULAR | Status: DC
  Administered 2022-02-09 – 2022-02-11 (×5): 40 mg via INTRAVENOUS
  Filled 2022-02-08 (×5): qty 4

## 2022-02-08 MED ORDER — SODIUM CHLORIDE 0.9% FLUSH
3.0000 mL | Freq: Two times a day (BID) | INTRAVENOUS | Status: DC
  Administered 2022-02-08 – 2022-02-14 (×11): 3 mL via INTRAVENOUS

## 2022-02-08 NOTE — ED Provider Notes (Signed)
[facility] EMERGENCY DEPARTMENT Provider Note   CSN: [id number] Arrival date & time: 02/08/22  1344     History  Chief Complaint  Patient presents with   Shortness of Breath    Henry Nichols is a 88 y.o. male.  Patient presents from outpatient office for shortness of breath and concern for new A-fib.  Patient has no known cardiac history.  Patient's had gradually worsening shortness of breath worse with lying flat with unknown amount of weight gain over the past few weeks.  No anticoagulant use.  Recently patient took doxycycline for UTI and completed it.  No vomiting or chest pain.  No abdominal pain or cough.  Mild exertional symptoms.       Home Medications Prior to Admission medications   Medication Sig Start Date End Date Taking? Authorizing Provider  acetaminophen (TYLENOL) 325 MG tablet Take 2 tablets (650 mg total) by mouth every 6 (six) hours as needed for mild pain or moderate pain. 07/09/17   Adam Phenix, PA-C  albuterol (PROVENTIL HFA;VENTOLIN HFA) 108 (90 Base) MCG/ACT inhaler Inhale 1 puff every 6 (six) hours as needed into the lungs for wheezing or shortness of breath.    [provider]  aspirin EC 81 MG tablet Take 81 mg daily by mouth.    [provider]  atenolol-chlorthalidone (TENORETIC) 100-25 MG tablet Take 0.5 tablets daily by mouth. 06/08/17   [provider]  Multiple Vitamins-Minerals (PRESERVISION AREDS 2 PO) Take 1 capsule 2 (two) times daily by mouth.    [provider]  pantoprazole (PROTONIX) 40 MG tablet Take 40 mg daily by mouth. Patient not taking: Reported on 04/30/2021 06/08/17   [provider]  potassium chloride SA (K-DUR,KLOR-CON) 20 MEQ tablet Take 40 mEq daily by mouth.    [provider]  simvastatin (ZOCOR) 40 MG tablet Take 1 tablet (40 mg total) by mouth every evening. 08/03/17   Chilton Si, MD  triamcinolone cream (KENALOG) 0.1 % Apply 1  application daily as needed topically (for dry skin).    [provider]      Allergies    Patient has no known allergies.    Review of Systems   Review of Systems  Constitutional:  Negative for chills and fever.  HENT:  Negative for congestion.   Eyes:  Negative for visual disturbance.  Respiratory:  Positive for shortness of breath.   Cardiovascular:  Positive for leg swelling. Negative for chest pain.  Gastrointestinal:  Negative for abdominal pain and vomiting.  Genitourinary:  Negative for dysuria and flank pain.  Musculoskeletal:  Negative for back pain, neck pain and neck stiffness.  Skin:  Negative for rash.  Neurological:  Negative for light-headedness and headaches.    Physical Exam Updated Vital Signs BP (!) 122/98   Pulse (!) 110   Temp (!) 97.3 F (36.3 C)   Resp 20   Ht 5\' 10"  (1.778 m)   Wt 74.8 kg   SpO2 100%   BMI 23.68 kg/m  Physical Exam Vitals and nursing note reviewed.  Constitutional:      General: He is not in acute distress.    Appearance: He is well-developed.  HENT:     Head: Normocephalic and atraumatic.     Mouth/Throat:     Mouth: Mucous membranes are moist.  Eyes:     General:        Right eye: No discharge.        Left eye: No  discharge.     Conjunctiva/sclera: Conjunctivae normal.  Neck:     Trachea: No tracheal deviation.  Cardiovascular:     Rate and Rhythm: Normal rate and regular rhythm.     Heart sounds: No murmur heard. Pulmonary:     Effort: Pulmonary effort is normal.     Breath sounds: Examination of the right-lower field reveals rales. Examination of the left-lower field reveals rales. Decreased breath sounds and rales present.  Abdominal:     General: There is no distension.     Palpations: Abdomen is soft.     Tenderness: There is no abdominal tenderness. There is no guarding.  Musculoskeletal:     Cervical back: Normal range of motion and neck supple. No rigidity.     Right lower leg: Edema present.      Left lower leg: Edema present.  Skin:    General: Skin is warm.     Capillary Refill: Capillary refill takes less than 2 seconds.     Findings: No rash.  Neurological:     General: No focal deficit present.     Mental Status: He is alert.     Cranial Nerves: No cranial nerve deficit.  Psychiatric:        Mood and Affect: Mood normal.     ED Results / Procedures / Treatments   Labs (all labs ordered are listed, but only abnormal results are displayed) Labs Reviewed  COMPREHENSIVE METABOLIC PANEL - Abnormal; Notable for the following components:      Result Value   Glucose, Bld 115 (*)    Creatinine, Ser 1.44 (*)    Albumin 3.1 (*)    Total Bilirubin 1.6 (*)    GFR, Estimated 47 (*)    All other components within normal limits  CBC WITH DIFFERENTIAL/PLATELET - Abnormal; Notable for the following components:   RBC 3.75 (*)    Hemoglobin 12.1 (*)    HCT 37.7 (*)    MCV 100.5 (*)    All other components within normal limits  BRAIN NATRIURETIC PEPTIDE - Abnormal; Notable for the following components:   B Natriuretic Peptide 598.9 (*)    All other components within normal limits  TROPONIN I (HIGH SENSITIVITY) - Abnormal; Notable for the following components:   Troponin I (High Sensitivity) 31 (*)    All other components within normal limits  TROPONIN I (HIGH SENSITIVITY)    EKG EKG Interpretation  Date/Time:  Monday February 08 2022 13:43:53 EDT Ventricular Rate:  110 PR Interval:  130 QRS Duration: 140 QT Interval:  374 QTC Calculation: 506 R Axis:   62 Text Interpretation: Sinus tachycardia with Premature atrial complexes Left ventricular hypertrophy with QRS widening and repolarization abnormality ( Cornell product ) Abnormal ECG When compared with ECG of 29-Jun-2017 09:11, PREVIOUS ECG IS PRESENT Confirmed by Blane Ohara [phone]) on 02/08/2022 3:22:40 PM  Radiology DG Chest 2 View  Result Date: 02/08/2022 CLINICAL DATA:  Shortness of breath and bilateral lower  extremity swelling. Atrial fibrillation. EXAM: CHEST - 2 VIEW COMPARISON:  Chest CT 10/16/2019.  Chest radiographs 12/06/2006. FINDINGS: The right heart border is obscured. There are new small bilateral pleural effusions, right larger than left, with bibasilar airspace opacities. The interstitial markings are increased bilaterally with Kerley lines noted on the left. No pneumothorax is identified. No acute osseous abnormality is identified. IMPRESSION: New small bilateral pleural effusions, mild interstitial edema, and bibasilar atelectasis or consolidation. Electronically Signed   By: Sebastian Ache M.D.   On: 02/08/2022  14:41    Procedures .Critical Care  Performed by: Blane Ohara, MD Authorized by: Blane Ohara, MD   Critical care provider statement:    Critical care time (minutes):  30   Critical care start time:  02/08/2022 3:30 PM   Critical care end time:  02/08/2022 4:00 PM   Critical care time was exclusive of:  Separately billable procedures and treating other patients and teaching time   Critical care was necessary to treat or prevent imminent or life-threatening deterioration of the following conditions:  Cardiac failure   Critical care was time spent personally by me on the following activities:  Development of treatment plan with patient or surrogate, evaluation of patient's response to treatment, examination of patient, ordering and review of laboratory studies, ordering and review of radiographic studies, ordering and performing treatments and interventions, pulse oximetry, re-evaluation of patient's condition, review of old charts and discussions with consultants     Medications Ordered in ED Medications  furosemide (LASIX) injection 40 mg (40 mg Intravenous Given 02/08/22 1608)    ED Course/ Medical Decision Making/ A&P                           Medical Decision Making Risk Prescription drug management. Decision regarding hospitalization.   Patient presents with  worsening shortness of breath and leg edema clinical concern for new pulm edema/congestive heart failure likely secondary to undiagnosed atrial fibrillation.  Discussed other differentials including vascular disease/silent heart attack.  Patient has no infectious symptoms at this time.  Patient is not requiring oxygen however does have dyspnea with exertion.  EKG left bundle branch block/LVH, mild changes to previous.  Patient on the monitor in the room heart rate ranging between 101 10 irregular reviewed.  Chest x-ray reviewed showing mild pulm edema and pleural effusions.  Blood work independently reviewed results showing mild renal insufficiency creatinine 1.44, no significant anemia hemoglobin 12, BNP 598 elevated with troponin 31.  Discussed with cardiology for consult and likely admission.  Updated patient on plan of care.  Lasix IV 40 mg ordered        Final Clinical Impression(s) / ED Diagnoses Final diagnoses:  Acute pulmonary edema (HCC)  Pleural effusion  Atrial fibrillation with RVR [facility])    Rx / DC Orders ED Discharge Orders     None         Blane Ohara, MD 02/09/22 2333

## 2022-02-08 NOTE — Assessment & Plan Note (Addendum)
CHADsVASC score 4 Patient with atrial flutter, paroxysmal atrial fibrillation.   Sp cardioversion on 06/30.  Plan to continue rate control with metoprolol and anticoagulation with apixaban.  Follow up as outpatient.

## 2022-02-08 NOTE — Assessment & Plan Note (Addendum)
Continue blood pressure control with metoprolol and added lisinopril at the time of his discharge.

## 2022-02-08 NOTE — Assessment & Plan Note (Addendum)
CKD stage 3a, hypokalemia.   Patient tolerated well diuresis with furosemide, negative fluid balance was achieved.  At the time of his discharge his renal function has a serum cr of 1.27 with K at 3,5 and serum bicarbonate at 28.   Plan to continue diuresis with furosemide and follow up renal function as outpatient.

## 2022-02-08 NOTE — Assessment & Plan Note (Deleted)
-   some improvement appreciated in patient's HR when compare with admission; but continue to be on atrial fib/atrial flutter. -Repeat echo demonstrating combined systolic and diastolic heart failure, ejection fraction 30-35% and global hypokinesis.  Most likely arrhythmia mediated.   -Patient denies chest pain or palpitations currently.   -Continue metoprolol, Jardiance and continue diuresis; now orally. -volume stable and tolerating being flat on bed. -Further GMDT and rate control agents in the setting of acute exacerbation and soft blood pressure as per cardiology service..   -Cardiology planning for cardioversion later today (02/12/22)   -continue Daily weights, low-sodium diet strict I's and O's -Follow clinical response.

## 2022-02-08 NOTE — Assessment & Plan Note (Addendum)
Patient changed to high dose high potency satin therapy with atorvastatin 80 mg daily, because elevated LDL.  Plan to follow up as out patient.

## 2022-02-08 NOTE — Assessment & Plan Note (Addendum)
-  Troponin flat.  -no CP -Cardiac enzymes elevation most likely in the setting of CHF exacerbation and arrhythmia. -cardiology on board and will follow rec's -continue telemetry monitoring -continue statin and metoprolol -aspirin stopped after initiation of anticoagulation.

## 2022-02-08 NOTE — Assessment & Plan Note (Addendum)
-  Stage IV high grade Grade 3A Small-bowel follicular Lymphoma with ileocolic progressive Lymphadenopathy. -No type B symptoms currently.  -S/p Rituxan weekly x 4 doses in 2018  -continue to follow up with oncology as an outpatient; per last follow up visit note there was No clinical or lab evidence of follicular lymphoma progression.

## 2022-02-09 ENCOUNTER — Encounter (HOSPITAL_COMMUNITY): Payer: Self-pay | Admitting: Family Medicine

## 2022-02-09 ENCOUNTER — Other Ambulatory Visit (HOSPITAL_COMMUNITY): Payer: Self-pay

## 2022-02-09 ENCOUNTER — Observation Stay (HOSPITAL_BASED_OUTPATIENT_CLINIC_OR_DEPARTMENT_OTHER): Payer: Medicare Other

## 2022-02-09 DIAGNOSIS — I5043 Acute on chronic combined systolic (congestive) and diastolic (congestive) heart failure: Secondary | ICD-10-CM

## 2022-02-09 DIAGNOSIS — I110 Hypertensive heart disease with heart failure: Secondary | ICD-10-CM | POA: Diagnosis not present

## 2022-02-09 DIAGNOSIS — I248 Other forms of acute ischemic heart disease: Secondary | ICD-10-CM | POA: Diagnosis present

## 2022-02-09 DIAGNOSIS — I351 Nonrheumatic aortic (valve) insufficiency: Secondary | ICD-10-CM | POA: Diagnosis not present

## 2022-02-09 DIAGNOSIS — I10 Essential (primary) hypertension: Secondary | ICD-10-CM | POA: Diagnosis not present

## 2022-02-09 DIAGNOSIS — I5021 Acute systolic (congestive) heart failure: Secondary | ICD-10-CM

## 2022-02-09 DIAGNOSIS — N1831 Chronic kidney disease, stage 3a: Secondary | ICD-10-CM

## 2022-02-09 DIAGNOSIS — K219 Gastro-esophageal reflux disease without esophagitis: Secondary | ICD-10-CM | POA: Diagnosis present

## 2022-02-09 DIAGNOSIS — Z79899 Other long term (current) drug therapy: Secondary | ICD-10-CM | POA: Diagnosis not present

## 2022-02-09 DIAGNOSIS — I2510 Atherosclerotic heart disease of native coronary artery without angina pectoris: Secondary | ICD-10-CM | POA: Diagnosis present

## 2022-02-09 DIAGNOSIS — I4891 Unspecified atrial fibrillation: Secondary | ICD-10-CM | POA: Diagnosis present

## 2022-02-09 DIAGNOSIS — E785 Hyperlipidemia, unspecified: Secondary | ICD-10-CM | POA: Diagnosis present

## 2022-02-09 DIAGNOSIS — N179 Acute kidney failure, unspecified: Secondary | ICD-10-CM | POA: Diagnosis present

## 2022-02-09 DIAGNOSIS — Z7982 Long term (current) use of aspirin: Secondary | ICD-10-CM | POA: Diagnosis not present

## 2022-02-09 DIAGNOSIS — I447 Left bundle-branch block, unspecified: Secondary | ICD-10-CM | POA: Diagnosis present

## 2022-02-09 DIAGNOSIS — E876 Hypokalemia: Secondary | ICD-10-CM | POA: Diagnosis present

## 2022-02-09 DIAGNOSIS — I361 Nonrheumatic tricuspid (valve) insufficiency: Secondary | ICD-10-CM | POA: Diagnosis not present

## 2022-02-09 DIAGNOSIS — I4892 Unspecified atrial flutter: Secondary | ICD-10-CM

## 2022-02-09 DIAGNOSIS — R778 Other specified abnormalities of plasma proteins: Secondary | ICD-10-CM

## 2022-02-09 DIAGNOSIS — I130 Hypertensive heart and chronic kidney disease with heart failure and stage 1 through stage 4 chronic kidney disease, or unspecified chronic kidney disease: Secondary | ICD-10-CM | POA: Diagnosis present

## 2022-02-09 DIAGNOSIS — I5033 Acute on chronic diastolic (congestive) heart failure: Secondary | ICD-10-CM | POA: Diagnosis not present

## 2022-02-09 DIAGNOSIS — C8230 Follicular lymphoma grade IIIa, unspecified site: Secondary | ICD-10-CM | POA: Diagnosis present

## 2022-02-09 DIAGNOSIS — I480 Paroxysmal atrial fibrillation: Secondary | ICD-10-CM | POA: Diagnosis present

## 2022-02-09 DIAGNOSIS — I2781 Cor pulmonale (chronic): Secondary | ICD-10-CM | POA: Diagnosis present

## 2022-02-09 DIAGNOSIS — Z87891 Personal history of nicotine dependence: Secondary | ICD-10-CM | POA: Diagnosis not present

## 2022-02-09 DIAGNOSIS — J449 Chronic obstructive pulmonary disease, unspecified: Secondary | ICD-10-CM | POA: Diagnosis present

## 2022-02-09 DIAGNOSIS — Z8249 Family history of ischemic heart disease and other diseases of the circulatory system: Secondary | ICD-10-CM | POA: Diagnosis not present

## 2022-02-09 DIAGNOSIS — I700 Atherosclerosis of aorta: Secondary | ICD-10-CM | POA: Diagnosis present

## 2022-02-09 DIAGNOSIS — J810 Acute pulmonary edema: Secondary | ICD-10-CM | POA: Diagnosis present

## 2022-02-09 DIAGNOSIS — E7800 Pure hypercholesterolemia, unspecified: Secondary | ICD-10-CM | POA: Diagnosis not present

## 2022-02-09 LAB — ECHOCARDIOGRAM COMPLETE
AR max vel: 2.52 cm2
AV Peak grad: 2.7 mmHg
Ao pk vel: 0.81 m/s
Area-P 1/2: 5.13 cm2
Calc EF: 43.3 %
Height: 70 in
MV M vel: 4.22 m/s
MV Peak grad: 71.3 mmHg
P 1/2 time: 463 msec
S' Lateral: 2.8 cm
Single Plane A2C EF: 44 %
Single Plane A4C EF: 42.3 %
Weight: 2589.08 oz

## 2022-02-09 LAB — CBC
HCT: 37.6 % — ABNORMAL LOW (ref 39.0–52.0)
Hemoglobin: 11.9 g/dL — ABNORMAL LOW (ref 13.0–17.0)
MCH: 31.3 pg (ref 26.0–34.0)
MCHC: 31.6 g/dL (ref 30.0–36.0)
MCV: 98.9 fL (ref 80.0–100.0)
Platelets: 274 10*3/uL (ref 150–400)
RBC: 3.8 MIL/uL — ABNORMAL LOW (ref 4.22–5.81)
RDW: 15.4 % (ref 11.5–15.5)
WBC: 4.7 10*3/uL (ref 4.0–10.5)
nRBC: 0 % (ref 0.0–0.2)

## 2022-02-09 LAB — BASIC METABOLIC PANEL
Anion gap: 8 (ref 5–15)
BUN: 18 mg/dL (ref 8–23)
CO2: 30 mmol/L (ref 22–32)
Calcium: 9.1 mg/dL (ref 8.9–10.3)
Chloride: 102 mmol/L (ref 98–111)
Creatinine, Ser: 1.55 mg/dL — ABNORMAL HIGH (ref 0.61–1.24)
GFR, Estimated: 43 mL/min — ABNORMAL LOW (ref >60)
Glucose, Bld: 126 mg/dL — ABNORMAL HIGH (ref 70–99)
Potassium: 3.9 mmol/L (ref 3.5–5.1)
Sodium: 140 mmol/L (ref 135–145)

## 2022-02-09 LAB — LIPID PANEL
Cholesterol: 169 mg/dL (ref 0–200)
HDL: 51 mg/dL (ref >40)
LDL Cholesterol: 105 mg/dL — ABNORMAL HIGH (ref 0–99)
Total CHOL/HDL Ratio: 3.3 RATIO
Triglycerides: 67 mg/dL (ref <150)
VLDL: 13 mg/dL (ref 0–40)

## 2022-02-09 LAB — TSH: TSH: 3.648 u[IU]/mL (ref 0.350–4.500)

## 2022-02-09 MED ORDER — MELATONIN 5 MG PO TABS
10.0000 mg | ORAL_TABLET | Freq: Every day | ORAL | Status: DC
  Administered 2022-02-09 – 2022-02-13 (×5): 10 mg via ORAL
  Filled 2022-02-09 (×6): qty 2

## 2022-02-09 MED ORDER — LORAZEPAM 0.5 MG PO TABS
0.5000 mg | ORAL_TABLET | Freq: Once | ORAL | Status: AC
Start: 2022-02-09 — End: 2022-02-09
  Administered 2022-02-09: 0.5 mg via ORAL
  Filled 2022-02-09: qty 1

## 2022-02-09 MED ORDER — ATORVASTATIN CALCIUM 80 MG PO TABS
80.0000 mg | ORAL_TABLET | Freq: Every day | ORAL | Status: DC
  Administered 2022-02-09 – 2022-02-14 (×6): 80 mg via ORAL
  Filled 2022-02-09 (×7): qty 1

## 2022-02-09 NOTE — Progress Notes (Signed)
Mobility Specialist Progress Note    02/09/22 1209  Mobility  Activity Ambulated independently in hallway  Level of Assistance Standby assist, set-up cues, supervision of patient - no hands on  Assistive Device None  Distance Ambulated (ft) 450 ft  Activity Response Tolerated well  $Mobility charge 1 Mobility   Pre-Mobility: 113 HR, 100/77 BP During Mobility: 116 HR Post-Mobility: 113 HR, 99% SpO2  Pt received standing EOB and agreeable. No complaints on walk. Returned to standing at bedside with call bell in reach. RN notified.   [place] Mobility Specialist

## 2022-02-09 NOTE — TOC Initial Note (Signed)
Transition of Care [facility]) - Initial/Assessment Note    Patient Details  Name: MEHTAB EZZELL MRN: [id number] Date of Birth: 1932-08-16  Transition of [facility]) CM/SW Contact:    Durenda Guthrie, RN Phone Number: 02/09/2022, 11:04 AM  Clinical Narrative:                 Transition of Care Screening Note:  Transition of Care Department [facility]) has reviewed patient and no TOC needs have been identified at this time. We will continue to monitor patient advancement through Interdisciplinary progressions. If new patient transition needs arise, please place a consult.          Patient Goals and CMS Choice        Expected Discharge Plan and Services                                                Prior Living Arrangements/Services                       Activities of Daily Living Home Assistive Devices/Equipment: None ADL Screening (condition at time of admission) Patient's cognitive ability adequate to safely complete daily activities?: Yes Is the patient deaf or have difficulty hearing?: Yes Does the patient have difficulty seeing, even when wearing glasses/contacts?: Yes Does the patient have difficulty concentrating, remembering, or making decisions?: No Patient able to express need for assistance with ADLs?: Yes Does the patient have difficulty dressing or bathing?: No Independently performs ADLs?: Yes (appropriate for developmental age) Does the patient have difficulty walking or climbing stairs?: Yes Weakness of Legs: Both Weakness of Arms/Hands: None  Permission Sought/Granted                  Emotional Assessment              Admission diagnosis:  Acute pulmonary edema (HCC) [J81.0] Pleural effusion [J90] Hypoxia [R09.02] Atrial fibrillation with rapid ventricular response (HCC) [I48.91] Atrial fibrillation with RVR (HCC) [I48.91] Patient Active Problem List   Diagnosis Date Noted   Paroxysmal atrial fibrillation with RVR  (HCC) 02/08/2022   Acute on chronic diastolic CHF (congestive heart failure) (HCC) 02/08/2022   CKD (chronic kidney disease) stage 3, GFR 30-59 ml/min (HCC) 02/08/2022   Elevated troponin 02/08/2022   Follicular lymphoma grade 3a (HCC) 08/24/2017   Counseling regarding advanced care planning and goals of care 08/24/2017   LBBB (left bundle branch block) 08/03/2017   Non-Hodgkin lymphoma (HCC) 08/03/2017   Essential hypertension 08/03/2017   Coronary artery calcification 08/03/2017   Hyperlipidemia 08/03/2017   Varicose vein of leg 08/03/2017   Small bowel mass 07/06/2017   PCP:  Johny Blamer, MD Pharmacy:   [facility] [phone] [place], [place] - [address] AT [facility] OF [address] [place] [address] Phone: [phone] Fax: [phone]     Social Determinants of Health (SDOH) Interventions    Readmission Risk Interventions     No data to display

## 2022-02-09 NOTE — Plan of Care (Signed)
  Problem: Education: Goal: Knowledge of General Education information will improve Description: Including pain rating scale, medication(s)/side effects and non-pharmacologic comfort measures Outcome: Progressing   Problem: Health Behavior/Discharge Planning: Goal: Ability to manage health-related needs will improve Outcome: Progressing   Problem: Clinical Measurements: Goal: Ability to maintain clinical measurements within normal limits will improve Outcome: Progressing Goal: Will remain free from infection Outcome: Progressing Goal: Diagnostic test results will improve Outcome: Progressing Goal: Respiratory complications will improve Outcome: Progressing Goal: Cardiovascular complication will be avoided Outcome: Progressing   Problem: Activity: Goal: Risk for activity intolerance will decrease Outcome: Progressing   Problem: Nutrition: Goal: Adequate nutrition will be maintained Outcome: Progressing   Problem: Coping: Goal: Level of anxiety will decrease Outcome: Progressing   Problem: Elimination: Goal: Will not experience complications related to bowel motility Outcome: Progressing Goal: Will not experience complications related to urinary retention Outcome: Progressing   Problem: Pain Managment: Goal: General experience of comfort will improve Outcome: Progressing   Problem: Safety: Goal: Ability to remain free from injury will improve Outcome: Progressing   Problem: Skin Integrity: Goal: Risk for impaired skin integrity will decrease Outcome: Progressing   Problem: Education: Goal: Ability to demonstrate management of disease process will improve Outcome: Progressing Goal: Ability to verbalize understanding of medication therapies will improve Outcome: Progressing Goal: Individualized Educational Video(s) Outcome: Progressing   Problem: Activity: Goal: Capacity to carry out activities will improve Outcome: Progressing   Problem: Cardiac: Goal:  Ability to achieve and maintain adequate cardiopulmonary perfusion will improve Outcome: Progressing   Problem: Education: Goal: Ability to demonstrate management of disease process will improve Outcome: Progressing Goal: Ability to verbalize understanding of medication therapies will improve Outcome: Progressing Goal: Individualized Educational Video(s) Outcome: Progressing   Problem: Activity: Goal: Capacity to carry out activities will improve Outcome: Progressing   Problem: Cardiac: Goal: Ability to achieve and maintain adequate cardiopulmonary perfusion will improve Outcome: Progressing   

## 2022-02-09 NOTE — Discharge Instructions (Addendum)
Information on my medicine - ELIQUIS (apixaban)  Why was Eliquis prescribed for you? Eliquis was prescribed for you to reduce the risk of a blood clot forming that can cause a stroke if you have a medical condition called atrial fibrillation (a type of irregular heartbeat).  What do You need to know about Eliquis ? Take your Eliquis TWICE DAILY - one tablet in the morning and one tablet in the evening with or without food. If you have difficulty swallowing the tablet whole please discuss with your pharmacist how to take the medication safely.  Take Eliquis exactly as prescribed by your doctor and DO NOT stop taking Eliquis without talking to the doctor who prescribed the medication.  Stopping may increase your risk of developing a stroke.  Refill your prescription before you run out.  After discharge, you should have regular check-up appointments with your healthcare provider that is prescribing your Eliquis.  In the future your dose may need to be changed if your kidney function or weight changes by a significant amount or as you get older.  What do you do if you miss a dose? If you miss a dose, take it as soon as you remember on the same day and resume taking twice daily.  Do not take more than one dose of ELIQUIS at the same time to make up a missed dose.  Important Safety Information A possible side effect of Eliquis is bleeding. You should call your healthcare provider right away if you experience any of the following: Bleeding from an injury or your nose that does not stop. Unusual colored urine (red or dark brown) or unusual colored stools (red or black). Unusual bruising for unknown reasons. A serious fall or if you hit your head (even if there is no bleeding).  Some medicines may interact with Eliquis and might increase your risk of bleeding or clotting while on Eliquis. To help avoid this, consult your healthcare provider or pharmacist prior to using any new prescription or  non-prescription medications, including herbals, vitamins, non-steroidal anti-inflammatory drugs (NSAIDs) and supplements.  This website has more information on Eliquis (apixaban): http://www.eliquis.com/eliquis/home   ==================================================  Atrial Fibrillation    Atrial fibrillation is a type of heartbeat that is irregular or fast. If you have this condition, your heart beats without any order. This makes it hard for your heart to pump blood in a normal way. Atrial fibrillation may come and go, or it may become a long-lasting problem. If this condition is not treated, it can put you at higher risk for stroke, heart failure, and other heart problems.  What are the causes? This condition may be caused by diseases that damage the heart. They include: High blood pressure. Heart failure. Heart valve disease. Heart surgery. Other causes include: Diabetes. Thyroid disease. Being overweight. Kidney disease. Sometimes the cause is not known.  What increases the risk? You are more likely to develop this condition if: You are older. You smoke. You exercise often and very hard. You have a family history of this condition. You are a man. You use drugs. You drink a lot of alcohol. You have lung conditions, such as emphysema, pneumonia, or COPD. You have sleep apnea.  What are the signs or symptoms? Common symptoms of this condition include: A feeling that your heart is beating very fast. Chest pain or discomfort. Feeling short of breath. Suddenly feeling light-headed or weak. Getting tired easily during activity. Fainting. Sweating. In some cases, there are no symptoms.  How   is this treated? Treatment for this condition depends on underlying conditions and how you feel when you have atrial fibrillation. They include: Medicines to: Prevent blood clots. Treat heart rate or heart rhythm problems. Using devices, such as a pacemaker, to correct heart  rhythm problems. Doing surgery to remove the part of the heart that sends bad signals. Closing an area where clots can form in the heart (left atrial appendage). In some cases, your doctor will treat other underlying conditions.  Follow these instructions at home:  Medicines Take over-the-counter and prescription medicines only as told by your doctor. Do not take any new medicines without first talking to your doctor. If you are taking blood thinners: Talk with your doctor before you take any medicines that have aspirin or NSAIDs, such as ibuprofen, in them. Take your medicine exactly as told by your doctor. Take it at the same time each day. Avoid activities that could hurt or bruise you. Follow instructions about how to prevent falls. Wear a bracelet that says you are taking blood thinners. Or, carry a card that lists what medicines you take. Lifestyle         Do not use any products that have nicotine or tobacco in them. These include cigarettes, e-cigarettes, and chewing tobacco. If you need help quitting, ask your doctor. Eat heart-healthy foods. Talk with your doctor about the right eating plan for you. Exercise regularly as told by your doctor. Do not drink alcohol. Lose weight if you are overweight. Do not use drugs, including cannabis.  General instructions If you have a condition that causes breathing to stop for a short period of time (apnea), treat it as told by your doctor. Keep a healthy weight. Do not use diet pills unless your doctor says they are safe for you. Diet pills may make heart problems worse. Keep all follow-up visits as told by your doctor. This is important.  Contact a doctor if: You notice a change in the speed, rhythm, or strength of your heartbeat. You are taking a blood-thinning medicine and you get more bruising. You get tired more easily when you move or exercise. You have a sudden change in weight.  Get help right away if:    You have pain  in your chest or your belly (abdomen). You have trouble breathing. You have side effects of blood thinners, such as blood in your vomit, poop (stool), or pee (urine), or bleeding that cannot stop. You have any signs of a stroke. "BE FAST" is an easy way to remember the main warning signs: B - Balance. Signs are dizziness, sudden trouble walking, or loss of balance. E - Eyes. Signs are trouble seeing or a change in how you see. F - Face. Signs are sudden weakness or loss of feeling in the face, or the face or eyelid drooping on one side. A - Arms. Signs are weakness or loss of feeling in an arm. This happens suddenly and usually on one side of the body. S - Speech. Signs are sudden trouble speaking, slurred speech, or trouble understanding what people say. T - Time. Time to call emergency services. Write down what time symptoms started. You have other signs of a stroke, such as: A sudden, very bad headache with no known cause. Feeling like you may vomit (nausea). Vomiting. A seizure.  These symptoms may be an emergency. Do not wait to see if the symptoms will go away. Get medical help right away. Call your local emergency services (911 in   the [place] Do not drive yourself to the hospital. Summary Atrial fibrillation is a type of heartbeat that is irregular or fast. You are at higher risk of this condition if you smoke, are older, have diabetes, or are overweight. Follow your doctor's instructions about medicines, diet, exercise, and follow-up visits. Get help right away if you have signs or symptoms of a stroke. Get help right away if you cannot catch your breath, or you have chest pain or discomfort. This information is not intended to replace advice given to you by your health care provider. Make sure you discuss any questions you have with your health care provider. Document Revised: 01/24/2019 Document Reviewed: 01/24/2019 Elsevier Patient Education  2020 [employer]    

## 2022-02-09 NOTE — Progress Notes (Signed)
Pt is getting more confused as the day progresses.  Pt cannot state why he is here or the date.  Pt alert to self only.  Pt is getting aggressive and doesn't wont to follow simple commands.  Pt is refusing to move to a room closer to the nurses station.  Talking with son now on phone and he states, "unable to come and stay with him".  Will notify dr for other resources.  Will continue to monitor.

## 2022-02-09 NOTE — Hospital Course (Addendum)
Henry Nichols was admitted for acute on chronic CHF with massive volume overload, new diagnosis atrial fibrillation with RVR.   88 year old man PMH diastolic CHF presented with shortness of breath, recently treated for right testicular infection with doxycycline presented with leg swelling and new diagnosis of atrial fibrillation.  Patient reported about 6 days of painful lower extremity edema, associated with dyspnea. Patient was evaluated by his primary care provider and was found in atrial fibrillation. On his initial physical examination his blood pressure was 122/91, HR 109, RR 26 and 02 saturation 98%, lungs with no wheezing or rhonchi, heart with S1 and S2 present regular, positive systolic murmur, abdomen not distended, positive lower extremity edema.   Na 139, K 4,0 CL 104, bicarbonate 28, glucose 115 bun 13 cr 1,4 BNP 598  High sensitive troponin 31 and 30  Wbc 5,1 hgb 12,1 plt 282    Chest radiograph with mild cardiomegaly, right hemidiaphragm elevation, with small right pleural effusion, bilateral basal atelectasis.   EKG 110 bpm, normal axis, left bundle branch block, sinus rhythm with poor R wave progression, no significant ST segment or T wave changes. Positive LVH.   Patient was placed on IV diuretic therapy for diuresis with good response.   Follow up echocardiogram with reduced LV systolic function.   06/30 TEE cardioversion.

## 2022-02-10 ENCOUNTER — Other Ambulatory Visit (HOSPITAL_COMMUNITY): Payer: Self-pay

## 2022-02-10 DIAGNOSIS — I480 Paroxysmal atrial fibrillation: Secondary | ICD-10-CM

## 2022-02-10 DIAGNOSIS — I248 Other forms of acute ischemic heart disease: Secondary | ICD-10-CM | POA: Diagnosis not present

## 2022-02-10 DIAGNOSIS — I10 Essential (primary) hypertension: Secondary | ICD-10-CM | POA: Diagnosis not present

## 2022-02-10 DIAGNOSIS — I5043 Acute on chronic combined systolic (congestive) and diastolic (congestive) heart failure: Secondary | ICD-10-CM | POA: Diagnosis not present

## 2022-02-10 LAB — BASIC METABOLIC PANEL
Anion gap: 11 (ref 5–15)
BUN: 25 mg/dL — ABNORMAL HIGH (ref 8–23)
CO2: 33 mmol/L — ABNORMAL HIGH (ref 22–32)
Calcium: 9.1 mg/dL (ref 8.9–10.3)
Chloride: 95 mmol/L — ABNORMAL LOW (ref 98–111)
Creatinine, Ser: 1.55 mg/dL — ABNORMAL HIGH (ref 0.61–1.24)
GFR, Estimated: 43 mL/min — ABNORMAL LOW (ref >60)
Glucose, Bld: 110 mg/dL — ABNORMAL HIGH (ref 70–99)
Potassium: 3.5 mmol/L (ref 3.5–5.1)
Sodium: 139 mmol/L (ref 135–145)

## 2022-02-10 MED ORDER — EMPAGLIFLOZIN 10 MG PO TABS
10.0000 mg | ORAL_TABLET | Freq: Every day | ORAL | Status: DC
Start: 2022-02-10 — End: 2022-02-14
  Administered 2022-02-10 – 2022-02-14 (×5): 10 mg via ORAL
  Filled 2022-02-10 (×6): qty 1

## 2022-02-10 MED ORDER — SPIRONOLACTONE 12.5 MG HALF TABLET
12.5000 mg | ORAL_TABLET | Freq: Every day | ORAL | Status: DC
  Administered 2022-02-10 – 2022-02-11 (×2): 12.5 mg via ORAL
  Filled 2022-02-10 (×2): qty 1

## 2022-02-10 MED ORDER — POTASSIUM CHLORIDE CRYS ER 20 MEQ PO TBCR
40.0000 meq | EXTENDED_RELEASE_TABLET | Freq: Once | ORAL | Status: DC
  Filled 2022-02-10: qty 2

## 2022-02-10 MED ORDER — HALOPERIDOL LACTATE 5 MG/ML IJ SOLN
0.5000 mg | Freq: Three times a day (TID) | INTRAMUSCULAR | Status: DC | PRN
  Administered 2022-02-13: 0.5 mg via INTRAVENOUS
  Filled 2022-02-10: qty 1

## 2022-02-10 MED ORDER — METOPROLOL TARTRATE 5 MG/5ML IV SOLN
2.5000 mg | Freq: Three times a day (TID) | INTRAVENOUS | Status: DC
  Administered 2022-02-10 – 2022-02-11 (×2): 2.5 mg via INTRAVENOUS
  Filled 2022-02-10: qty 5

## 2022-02-10 MED ORDER — LORAZEPAM 2 MG/ML IJ SOLN
1.0000 mg | Freq: Once | INTRAMUSCULAR | Status: AC
Start: 2022-02-10 — End: 2022-02-10
  Administered 2022-02-10: 1 mg via INTRAVENOUS
  Filled 2022-02-10: qty 1

## 2022-02-10 MED ORDER — METOPROLOL TARTRATE 5 MG/5ML IV SOLN
2.5000 mg | Freq: Two times a day (BID) | INTRAVENOUS | Status: DC
  Filled 2022-02-10: qty 5

## 2022-02-10 NOTE — Progress Notes (Signed)
Heart Failure Stewardship Pharmacist Progress Note   PCP: [name], MD PCP-Cardiologist: Freada Bergeron, MD    HPI:  88 yo M with PMH of HTN, HLD, LBBB, mild COPD, non-hodgkin's lymphoma, and GERD.  He presented to the ED on 6/26 with shortness of breath, BLE edema, and orthopnea. He went to see his PCP and was found to be in afib RVR and directed him to the ED for evaluation. Last ECHO in 08/2017 with LVEF 60-65%. CXR on admission with new small bilateral pleural effusions and mild interstitial edema. ECHO on 6/27 with LVEF reduced to 30-35%, mild LVH, RV moderately reduced, mildly elevated pulmonary artery pressures, mild MR and mild AR.   Pending TEE/DCCV once more euvolemic.  Current HF Medications: Diuretic: furosemide 40 mg IV BID Beta Blocker: metoprolol tartrate 25 mg BID Aldosterone Antagonist: spironolactone 12.5 mg daily SGLT2i: Jardiance 10 mg daily  Prior to admission HF Medications: None  Pertinent Lab Values: Serum creatinine 1.55, BUN 25, Potassium 3.5, Sodium 139, BNP 598.9, Magnesium 2.1   Vital Signs: Weight: 152 lbs (admission weight: 161 lbs) Blood pressure: 110-140/80s  Heart rate: 110s - afib  Output (not documenting input): -6.8L yesterday; since admission -10.1L  Medication Assistance / Insurance Benefits Check: Does the patient have prescription insurance?  Yes Type of insurance plan: AARP Medicare  Does the patient qualify for medication assistance through manufacturers or grants?   Pending Eligible grants and/or patient assistance programs: pending Medication assistance applications in progress: none  Medication assistance applications approved: none Approved medication assistance renewals will be completed by: pending  Outpatient Pharmacy:  Prior to admission outpatient pharmacy: [facility] Is the patient willing to use [facility] at discharge? Yes Is the patient willing to transition their outpatient pharmacy to utilize a [facility] outpatient pharmacy?   Pending    Assessment: 1. Acute on chronic systolic CHF (LVEF 74-94%), due to presumed afib RVR. Pneding TEE/DCCV once more euvolemic. NYHA class III symptoms. - Excellent diuresis. Weight down and good urine output. Continue furosemide 40 mg IV BID. Keep K>4 and Mag>2. KCl 40 mEq x 1 ordered for replacement. - Continue metoprolol tartrate 25 mg BID - consider transitioning to XL once dose determined. - Agree with starting spironolactone 12.5 mg daily - Agree with starting Jardiance 10 mg daily - Can consider starting digoxin for RV and afib - caution with age and unclear baseline renal function. SCr 1.55 - was 1.19 in March.   Plan: 1) Medication changes recommended at this time: - Agree with changes  2) Patient assistance: Delene Loll copay $45 -Jardiance/Farxiga copay $45  3)  Education  - To be completed prior to discharge  Kerby Nora, PharmD, BCPS Heart Failure Stewardship Pharmacist Phone [phone]

## 2022-02-10 NOTE — Progress Notes (Signed)
Heart Failure Navigator Progress Note  Following this hospitalization to assess for HV TOC readiness.   EF 30-35%  ? TEE / DCCV ?  Earnestine Leys, BSN, Clinical cytogeneticist Only

## 2022-02-10 NOTE — TOC Benefit Eligibility Note (Signed)
Patient Teacher, English as a foreign language completed.    The patient is currently admitted and upon discharge could be taking Entresto 24-26 mg.  The current 30 day co-pay is, $45.00.   The patient is currently admitted and upon discharge could be taking Farxiga 10 mg.  The current 30 day co-pay is, $45.00.   The patient is currently admitted and upon discharge could be taking Jardiance 10 mg.  The current 30 day co-pay is, $45.00.   The patient is insured through [place], [place] Patient Advocate Specialist [place] Patient Advocate Team Direct Number: [phone]  Fax: [phone]

## 2022-02-10 NOTE — Progress Notes (Addendum)
Progress Note  Patient Name: Henry Nichols Date of Encounter: 02/10/2022  [facility] Cardiologist: Freada Bergeron, MD   Subjective   Continues to have ankle swelling. Output 4.25 L urine yesterday   Inpatient Medications    Scheduled Meds:  apixaban  5 mg Oral BID   atorvastatin  80 mg Oral Daily   empagliflozin  10 mg Oral Daily   furosemide  40 mg Intravenous BID   melatonin  10 mg Oral QHS   metoprolol tartrate  25 mg Oral BID   multivitamin  1 tablet Oral Daily   sodium chloride flush  3 mL Intravenous Q12H   Continuous Infusions:  sodium chloride     PRN Meds: sodium chloride, acetaminophen **OR** acetaminophen, sodium chloride flush   Vital Signs    Vitals:   02/10/22 0750 02/10/22 0751 02/10/22 0818 02/10/22 0823  BP: 108/79 (!) 144/87 (!) 144/87 (!) 144/87  Pulse: 99 99  99  Resp: '18 14 14 14  '$ Temp: (!) 97.5 F (36.4 C) 98 F (36.7 C)  98 F (36.7 C)  TempSrc: Axillary     SpO2: 97%  98%   Weight:      Height:        Intake/Output Summary (Last 24 hours) at 02/10/2022 0905 Last data filed at 02/10/2022 0800 Gross per 24 hour  Intake 0 ml  Output 4250 ml  Net -4250 ml      02/10/2022    3:28 AM 02/09/2022    5:53 AM 02/08/2022    7:01 PM  Last 3 Weights  Weight (lbs) 152 lb 4.8 oz 161 lb 13.1 oz 161 lb 3.2 oz  Weight (kg) 69.083 kg 73.4 kg 73.12 kg      Telemetry    Atrial flutter, HR 110 - Personally Reviewed  ECG    No new tracings - Personally Reviewed  Physical Exam   GEN: No acute distress.  Asleep, snoring  Neck: No JVD  Cardiac: Regular rhythm, tachycardic. Faint systolic murmur  Respiratory: Crackles in lung bases  GI: Soft, nontender, non-distended  MS: 2+ pitting edema in BLE, to the mid shin. Improved from yesterday  Neuro:  Nonfocal  Psych: Normal affect   Labs    High Sensitivity Troponin:   Recent Labs  Lab 02/08/22 1407 02/08/22 1553  TROPONINIHS 31* 30*     Chemistry Recent Labs  Lab  02/08/22 1407 02/08/22 1553 02/09/22 0036 02/10/22 0223  NA 139  --  140 139  K 4.0  --  3.9 3.5  CL 104  --  102 95*  CO2 28  --  30 33*  GLUCOSE 115*  --  126* 110*  BUN 13  --  18 25*  CREATININE 1.44*  --  1.55* 1.55*  CALCIUM 9.1  --  9.1 9.1  MG  --  2.1  --   --   PROT 6.5  --   --   --   ALBUMIN 3.1*  --   --   --   AST 31  --   --   --   ALT 22  --   --   --   ALKPHOS 74  --   --   --   BILITOT 1.6*  --   --   --   GFRNONAA 47*  --  43* 43*  ANIONGAP 7  --  8 11    Lipids  Recent Labs  Lab 02/09/22 0036  CHOL 169  TRIG  67  HDL 51  LDLCALC 105*  CHOLHDL 3.3    Hematology Recent Labs  Lab 02/08/22 1407 02/09/22 0036  WBC 5.1 4.7  RBC 3.75* 3.80*  HGB 12.1* 11.9*  HCT 37.7* 37.6*  MCV 100.5* 98.9  MCH 32.3 31.3  MCHC 32.1 31.6  RDW 15.3 15.4  PLT 282 274   Thyroid  Recent Labs  Lab 02/09/22 0036  TSH 3.648    BNP Recent Labs  Lab 02/08/22 1407  BNP 598.9*    DDimer No results for input(s): "DDIMER" in the last 168 hours.   Radiology    ECHOCARDIOGRAM COMPLETE  Result Date: 02/09/2022    ECHOCARDIOGRAM REPORT   Patient Name:   BREVIN Nichols Date of Exam: 02/09/2022 Medical Rec #:  [id number]        Height:       70.0 in Accession #:    [id number]       Weight:       161.8 lb Date of Birth:  08/09/1932       BSA:          1.908 m Patient Age:    88 years         BP:           100/77 mmHg Patient Gender: M                HR:           110 bpm. Exam Location:  Inpatient Procedure: 2D Echo, Cardiac Doppler and Color Doppler Indications:    CHF  History:        Patient has no prior history of Echocardiogram examinations.                 Arrythmias:Atrial Fibrillation and LBBB; Risk                 Factors:Hypertension.  Sonographer:    Jyl Heinz Referring Phys: [place]  1. There is severe LBBB-related systolic dyssynchrony. Left ventricular ejection fraction, by estimation, is 30 to 35%. The left ventricle has  moderately decreased function. The left ventricle demonstrates global hypokinesis. There is mild concentric left ventricular hypertrophy. Left ventricular diastolic function could not be evaluated.  2. Right ventricular systolic function is moderately reduced. The right ventricular size is normal. There is mildly elevated pulmonary artery systolic pressure. The estimated right ventricular systolic pressure is 25.3 mmHg.  3. Left atrial size was mild to moderately dilated.  4. Right atrial size was moderately dilated.  5. The mitral valve is normal in structure. Mild mitral valve regurgitation. No evidence of mitral stenosis.  6. Tricuspid valve regurgitation is moderate.  7. The aortic valve is tricuspid. Aortic valve regurgitation is mild. No aortic stenosis is present.  8. There is mild dilatation of the ascending aorta, measuring 38 mm.  9. The inferior vena cava is dilated in size with <50% respiratory variability, suggesting right atrial pressure of 15 mmHg. FINDINGS  Left Ventricle: There is severe LBBB-related systolic dyssynchrony. Left ventricular ejection fraction, by estimation, is 30 to 35%. The left ventricle has moderately decreased function. The left ventricle demonstrates global hypokinesis. The left ventricular internal cavity size was normal in size. There is mild concentric left ventricular hypertrophy. Abnormal (paradoxical) septal motion, consistent with left bundle branch block. Left ventricular diastolic function could not be evaluated due to atrial fibrillation. Left ventricular diastolic function could not be evaluated. Right Ventricle: The right ventricular size is normal. No  increase in right ventricular wall thickness. Right ventricular systolic function is moderately reduced. There is mildly elevated pulmonary artery systolic pressure. The tricuspid regurgitant velocity is 2.82 m/s, and with an assumed right atrial pressure of 15 mmHg, the estimated right ventricular systolic pressure is  22.0 mmHg. Left Atrium: Left atrial size was mild to moderately dilated. Right Atrium: Right atrial size was moderately dilated. Pericardium: There is no evidence of pericardial effusion. Mitral Valve: The mitral valve is normal in structure. Mild mitral annular calcification. Mild mitral valve regurgitation, with centrally-directed jet. No evidence of mitral valve stenosis. Tricuspid Valve: The tricuspid valve is normal in structure. Tricuspid valve regurgitation is moderate. Aortic Valve: The aortic valve is tricuspid. Aortic valve regurgitation is mild. Aortic regurgitation PHT measures 463 msec. No aortic stenosis is present. Aortic valve peak gradient measures 2.7 mmHg. Pulmonic Valve: The pulmonic valve was grossly normal. Pulmonic valve regurgitation is not visualized. Aorta: The aortic root is normal in size and structure. There is mild dilatation of the ascending aorta, measuring 38 mm. Venous: The inferior vena cava is dilated in size with less than 50% respiratory variability, suggesting right atrial pressure of 15 mmHg. IAS/Shunts: No atrial level shunt detected by color flow Doppler.  LEFT VENTRICLE PLAX 2D LVIDd:         3.70 cm     Diastology LVIDs:         2.80 cm     LV e' medial:    6.53 cm/s LV PW:         1.20 cm     LV E/e' medial:  12.5 LV IVS:        1.10 cm     LV e' lateral:   6.96 cm/s LVOT diam:     2.00 cm     LV E/e' lateral: 11.7 LV SV:         32 LV SV Index:   17 LVOT Area:     3.14 cm  LV Volumes (MOD) LV vol d, MOD A2C: 71.2 ml LV vol d, MOD A4C: 79.4 ml LV vol s, MOD A2C: 39.9 ml LV vol s, MOD A4C: 45.8 ml LV SV MOD A2C:     31.3 ml LV SV MOD A4C:     79.4 ml LV SV MOD BP:      34.2 ml RIGHT VENTRICLE            IVC RV Basal diam:  3.50 cm    IVC diam: 2.40 cm RV Mid diam:    2.90 cm RV S prime:     6.31 cm/s TAPSE (M-mode): 1.3 cm LEFT ATRIUM             Index        RIGHT ATRIUM           Index LA diam:        4.20 cm 2.20 cm/m   RA Area:     26.40 cm LA Vol (A2C):   47.0 ml  24.64 ml/m  RA Volume:   86.50 ml  45.34 ml/m LA Vol (A4C):   71.1 ml 37.27 ml/m LA Biplane Vol: 60.4 ml 31.66 ml/m  AORTIC VALVE AV Area (Vmax): 2.52 cm AV Vmax:        81.40 cm/s AV Peak Grad:   2.7 mmHg LVOT Vmax:      65.35 cm/s LVOT Vmean:     48.950 cm/s LVOT VTI:       0.103 m AI PHT:  463 msec  AORTA Ao Root diam: 3.70 cm Ao Asc diam:  3.80 cm MITRAL VALVE               TRICUSPID VALVE MV Area (PHT): 5.13 cm    TR Peak grad:   31.8 mmHg MV Decel Time: 148 msec    TR Vmax:        282.00 cm/s MR Peak grad: 71.3 mmHg MR Mean grad: 50.0 mmHg    SHUNTS MR Vmax:      422.33 cm/s  Systemic VTI:  0.10 m MR Vmean:     345.0 cm/s   Systemic Diam: 2.00 cm MV E velocity: 81.40 cm/s MV A velocity: 52.70 cm/s MV E/A ratio:  1.54 Mihai Croitoru MD Electronically signed by Sanda Klein MD Signature Date/Time: 02/09/2022/11:14:44 AM    Final    DG Chest 2 View  Result Date: 02/08/2022 CLINICAL DATA:  Shortness of breath and bilateral lower extremity swelling. Atrial fibrillation. EXAM: CHEST - 2 VIEW COMPARISON:  Chest CT 10/16/2019.  Chest radiographs 12/06/2006. FINDINGS: The right heart border is obscured. There are new small bilateral pleural effusions, right larger than left, with bibasilar airspace opacities. The interstitial markings are increased bilaterally with Kerley lines noted on the left. No pneumothorax is identified. No acute osseous abnormality is identified. IMPRESSION: New small bilateral pleural effusions, mild interstitial edema, and bibasilar atelectasis or consolidation. Electronically Signed   By: Logan Bores M.D.   On: 02/08/2022 14:41    Cardiac Studies   Echocardiogram 02/09/22   1. There is severe LBBB-related systolic dyssynchrony. Left ventricular  ejection fraction, by estimation, is 30 to 35%. The left ventricle has  moderately decreased function. The left ventricle demonstrates global  hypokinesis. There is mild concentric  left ventricular hypertrophy. Left  ventricular diastolic function could  not be evaluated.   2. Right ventricular systolic function is moderately reduced. The right  ventricular size is normal. There is mildly elevated pulmonary artery  systolic pressure. The estimated right ventricular systolic pressure is  73.2 mmHg.   3. Left atrial size was mild to moderately dilated.   4. Right atrial size was moderately dilated.   5. The mitral valve is normal in structure. Mild mitral valve  regurgitation. No evidence of mitral stenosis.   6. Tricuspid valve regurgitation is moderate.   7. The aortic valve is tricuspid. Aortic valve regurgitation is mild. No  aortic stenosis is present.   8. There is mild dilatation of the ascending aorta, measuring 38 mm.   9. The inferior vena cava is dilated in size with <50% respiratory  variability, suggesting right atrial pressure of 15 mmHg.   Patient Profile     88 y.o. male  with a hx of HLD, HTN, LBBB, mild COPD, non-hodgkin's lymphoma and GERD who presented with worsening dyspnea on exertion and LE edema found to have new Aflutter with RVR and new heart failure for which Cardiology was consulted.  Assessment & Plan    Atrial Flutter with RVR: New onset, patient has been unaware of his rhythm.  Denies any history of the same.  -- TSH within normal limits  -- Was started on metoprolol 25 mg BID this admission. HR continues to be elevated consistently to 110. BP somewhat soft so there is little room to titrate further -- Patient remains in a flutter after diuresing 8.85 L urine so far. Suspect he will need TEE/DCCV when more euvolemic  -- Continue eliquis 5 mg BID (new this admission)  --  Echocardiogram showed LVEF 30-35%, global hypokinesis, LBBB-related systolic dyssynchrony, moderately reduced RV function, mildly elevated pulmonary artery systolic pressure, moderately dilated RA and LA    Acute on Chronic HFrEF: History of HFpEF, presented significantly volume overloaded.  BNP 589 and  chest x-ray with bilateral effusions. Echo this admission showed newly reduced EF 30-35%, moderately reduced RV function  -- Suspect that drop in EF is related to afib with RVR. Plan TEE/DCCV when more euvolemic  -- Has been on IV lasix '40mg'$  BID since admission. Output 4.25 L urine yesterday and is currently net -8.85 L since admission  -- Creatinine stable at 1.55. Continues to have lower extremity edema, crackles in lungs, but symptoms are improving with diuresis. Continue IV lasix for now  -- Will need an echocardiogram 6 weeks after restoration of NSR-- if patient continues to have a low EF, will need ischemic evaluation at that time  -- Continue metoprolol '25mg'$  BID -- Now on jardiance 10 mg daily  -- BP continues to be somewhat low-- 841-324 systolic. Add GDMT as BP tolerates. May have more BP room when he is off IV diuretics and in NSR   Hypertension:  -- Now on metoprolol 25 mg twice daily. BP is tolerating, but there is little room to titrate further   COPD: No wheezing on exam.   Hyperlipidemia:  -- Patient was taking Simvastatin 40 mg PTA -- LDL 105, HDL 51, triglycerides 67 this admission  -- Transitioned to lipitor 80 mg daily due to LDL of 105. Will need LFTs and lipid panel in 2-3 months        For questions or updates, please contact [place] Please consult [email or web address] for contact info under        Signed, Margie Billet, PA-C  02/10/2022, 9:05 AM

## 2022-02-10 NOTE — Progress Notes (Signed)
Progress Note  Patient Name: Henry Nichols Date of Encounter: 02/10/2022  [facility] Cardiologist: Freada Bergeron, MD   Subjective   Continues to have ankle swelling. Output 4.25 L urine yesterday   Inpatient Medications    Scheduled Meds:  apixaban  5 mg Oral BID   atorvastatin  80 mg Oral Daily   empagliflozin  10 mg Oral Daily   furosemide  40 mg Intravenous BID   melatonin  10 mg Oral QHS   metoprolol tartrate  25 mg Oral BID   multivitamin  1 tablet Oral Daily   potassium chloride  40 mEq Oral Once   sodium chloride flush  3 mL Intravenous Q12H   Continuous Infusions:  sodium chloride     PRN Meds: sodium chloride, acetaminophen **OR** acetaminophen, sodium chloride flush   Vital Signs    Vitals:   02/10/22 0750 02/10/22 0751 02/10/22 0818 02/10/22 0823  BP: 108/79 (!) 144/87 (!) 144/87 (!) 144/87  Pulse: 99 99  99  Resp: '18 14 14 14  '$ Temp: (!) 97.5 F (36.4 C) 98 F (36.7 C)  98 F (36.7 C)  TempSrc: Axillary     SpO2: 97%  98%   Weight:      Height:        Intake/Output Summary (Last 24 hours) at 02/10/2022 1155 Last data filed at 02/10/2022 1000 Gross per 24 hour  Intake 0 ml  Output 4150 ml  Net -4150 ml       02/10/2022    3:28 AM 02/09/2022    5:53 AM 02/08/2022    7:01 PM  Last 3 Weights  Weight (lbs) 152 lb 4.8 oz 161 lb 13.1 oz 161 lb 3.2 oz  Weight (kg) 69.083 kg 73.4 kg 73.12 kg      Telemetry    Atrial flutter, HR 110 - Personally Reviewed  ECG    No new tracings - Personally Reviewed  Physical Exam   GEN: No acute distress.  Asleep, snoring  Neck: No JVD  Cardiac: Regular rhythm, tachycardic. Faint systolic murmur  Respiratory: Crackles in lung bases  GI: Soft, nontender, non-distended  MS: 2+ pitting edema in BLE, to the mid shin. Improved from yesterday  Neuro:  Nonfocal  Psych: Normal affect   Labs    High Sensitivity Troponin:   Recent Labs  Lab 02/08/22 1407 02/08/22 1553  TROPONINIHS 31* 30*       Chemistry Recent Labs  Lab 02/08/22 1407 02/08/22 1553 02/09/22 0036 02/10/22 0223  NA 139  --  140 139  K 4.0  --  3.9 3.5  CL 104  --  102 95*  CO2 28  --  30 33*  GLUCOSE 115*  --  126* 110*  BUN 13  --  18 25*  CREATININE 1.44*  --  1.55* 1.55*  CALCIUM 9.1  --  9.1 9.1  MG  --  2.1  --   --   PROT 6.5  --   --   --   ALBUMIN 3.1*  --   --   --   AST 31  --   --   --   ALT 22  --   --   --   ALKPHOS 74  --   --   --   BILITOT 1.6*  --   --   --   GFRNONAA 47*  --  43* 43*  ANIONGAP 7  --  8 11     Lipids  Recent Labs  Lab 02/09/22 0036  CHOL 169  TRIG 67  HDL 51  LDLCALC 105*  CHOLHDL 3.3     Hematology Recent Labs  Lab 02/08/22 1407 02/09/22 0036  WBC 5.1 4.7  RBC 3.75* 3.80*  HGB 12.1* 11.9*  HCT 37.7* 37.6*  MCV 100.5* 98.9  MCH 32.3 31.3  MCHC 32.1 31.6  RDW 15.3 15.4  PLT 282 274    Thyroid  Recent Labs  Lab 02/09/22 0036  TSH 3.648     BNP Recent Labs  Lab 02/08/22 1407  BNP 598.9*     DDimer No results for input(s): "DDIMER" in the last 168 hours.   Radiology    ECHOCARDIOGRAM COMPLETE  Result Date: 02/09/2022    ECHOCARDIOGRAM REPORT   Patient Name:   Henry Nichols Date of Exam: 02/09/2022 Medical Rec #:  [id number]        Height:       70.0 in Accession #:    [id number]       Weight:       161.8 lb Date of Birth:  05/08/1932       BSA:          1.908 m Patient Age:    58 years         BP:           100/77 mmHg Patient Gender: M                HR:           110 bpm. Exam Location:  Inpatient Procedure: 2D Echo, Cardiac Doppler and Color Doppler Indications:    CHF  History:        Patient has no prior history of Echocardiogram examinations.                 Arrythmias:Atrial Fibrillation and LBBB; Risk                 Factors:Hypertension.  Sonographer:    Jyl Heinz Referring Phys: Towner  1. There is severe LBBB-related systolic dyssynchrony. Left ventricular ejection fraction, by  estimation, is 30 to 35%. The left ventricle has moderately decreased function. The left ventricle demonstrates global hypokinesis. There is mild concentric left ventricular hypertrophy. Left ventricular diastolic function could not be evaluated.  2. Right ventricular systolic function is moderately reduced. The right ventricular size is normal. There is mildly elevated pulmonary artery systolic pressure. The estimated right ventricular systolic pressure is 09.6 mmHg.  3. Left atrial size was mild to moderately dilated.  4. Right atrial size was moderately dilated.  5. The mitral valve is normal in structure. Mild mitral valve regurgitation. No evidence of mitral stenosis.  6. Tricuspid valve regurgitation is moderate.  7. The aortic valve is tricuspid. Aortic valve regurgitation is mild. No aortic stenosis is present.  8. There is mild dilatation of the ascending aorta, measuring 38 mm.  9. The inferior vena cava is dilated in size with <50% respiratory variability, suggesting right atrial pressure of 15 mmHg. FINDINGS  Left Ventricle: There is severe LBBB-related systolic dyssynchrony. Left ventricular ejection fraction, by estimation, is 30 to 35%. The left ventricle has moderately decreased function. The left ventricle demonstrates global hypokinesis. The left ventricular internal cavity size was normal in size. There is mild concentric left ventricular hypertrophy. Abnormal (paradoxical) septal motion, consistent with left bundle branch block. Left ventricular diastolic function could not be evaluated due to atrial fibrillation. Left ventricular  diastolic function could not be evaluated. Right Ventricle: The right ventricular size is normal. No increase in right ventricular wall thickness. Right ventricular systolic function is moderately reduced. There is mildly elevated pulmonary artery systolic pressure. The tricuspid regurgitant velocity is 2.82 m/s, and with an assumed right atrial pressure of 15 mmHg, the  estimated right ventricular systolic pressure is 02.5 mmHg. Left Atrium: Left atrial size was mild to moderately dilated. Right Atrium: Right atrial size was moderately dilated. Pericardium: There is no evidence of pericardial effusion. Mitral Valve: The mitral valve is normal in structure. Mild mitral annular calcification. Mild mitral valve regurgitation, with centrally-directed jet. No evidence of mitral valve stenosis. Tricuspid Valve: The tricuspid valve is normal in structure. Tricuspid valve regurgitation is moderate. Aortic Valve: The aortic valve is tricuspid. Aortic valve regurgitation is mild. Aortic regurgitation PHT measures 463 msec. No aortic stenosis is present. Aortic valve peak gradient measures 2.7 mmHg. Pulmonic Valve: The pulmonic valve was grossly normal. Pulmonic valve regurgitation is not visualized. Aorta: The aortic root is normal in size and structure. There is mild dilatation of the ascending aorta, measuring 38 mm. Venous: The inferior vena cava is dilated in size with less than 50% respiratory variability, suggesting right atrial pressure of 15 mmHg. IAS/Shunts: No atrial level shunt detected by color flow Doppler.  LEFT VENTRICLE PLAX 2D LVIDd:         3.70 cm     Diastology LVIDs:         2.80 cm     LV e' medial:    6.53 cm/s LV PW:         1.20 cm     LV E/e' medial:  12.5 LV IVS:        1.10 cm     LV e' lateral:   6.96 cm/s LVOT diam:     2.00 cm     LV E/e' lateral: 11.7 LV SV:         32 LV SV Index:   17 LVOT Area:     3.14 cm  LV Volumes (MOD) LV vol d, MOD A2C: 71.2 ml LV vol d, MOD A4C: 79.4 ml LV vol s, MOD A2C: 39.9 ml LV vol s, MOD A4C: 45.8 ml LV SV MOD A2C:     31.3 ml LV SV MOD A4C:     79.4 ml LV SV MOD BP:      34.2 ml RIGHT VENTRICLE            IVC RV Basal diam:  3.50 cm    IVC diam: 2.40 cm RV Mid diam:    2.90 cm RV S prime:     6.31 cm/s TAPSE (M-mode): 1.3 cm LEFT ATRIUM             Index        RIGHT ATRIUM           Index LA diam:        4.20 cm 2.20 cm/m    RA Area:     26.40 cm LA Vol (A2C):   47.0 ml 24.64 ml/m  RA Volume:   86.50 ml  45.34 ml/m LA Vol (A4C):   71.1 ml 37.27 ml/m LA Biplane Vol: 60.4 ml 31.66 ml/m  AORTIC VALVE AV Area (Vmax): 2.52 cm AV Vmax:        81.40 cm/s AV Peak Grad:   2.7 mmHg LVOT Vmax:      65.35 cm/s LVOT Vmean:  48.950 cm/s LVOT VTI:       0.103 m AI PHT:         463 msec  AORTA Ao Root diam: 3.70 cm Ao Asc diam:  3.80 cm MITRAL VALVE               TRICUSPID VALVE MV Area (PHT): 5.13 cm    TR Peak grad:   31.8 mmHg MV Decel Time: 148 msec    TR Vmax:        282.00 cm/s MR Peak grad: 71.3 mmHg MR Mean grad: 50.0 mmHg    SHUNTS MR Vmax:      422.33 cm/s  Systemic VTI:  0.10 m MR Vmean:     345.0 cm/s   Systemic Diam: 2.00 cm MV E velocity: 81.40 cm/s MV A velocity: 52.70 cm/s MV E/A ratio:  1.54 Mihai Croitoru MD Electronically signed by Sanda Klein MD Signature Date/Time: 02/09/2022/11:14:44 AM    Final    DG Chest 2 View  Result Date: 02/08/2022 CLINICAL DATA:  Shortness of breath and bilateral lower extremity swelling. Atrial fibrillation. EXAM: CHEST - 2 VIEW COMPARISON:  Chest CT 10/16/2019.  Chest radiographs 12/06/2006. FINDINGS: The right heart border is obscured. There are new small bilateral pleural effusions, right larger than left, with bibasilar airspace opacities. The interstitial markings are increased bilaterally with Kerley lines noted on the left. No pneumothorax is identified. No acute osseous abnormality is identified. IMPRESSION: New small bilateral pleural effusions, mild interstitial edema, and bibasilar atelectasis or consolidation. Electronically Signed   By: Logan Bores M.D.   On: 02/08/2022 14:41    Cardiac Studies   Echocardiogram 02/09/22   1. There is severe LBBB-related systolic dyssynchrony. Left ventricular  ejection fraction, by estimation, is 30 to 35%. The left ventricle has  moderately decreased function. The left ventricle demonstrates global  hypokinesis. There is mild  concentric  left ventricular hypertrophy. Left ventricular diastolic function could  not be evaluated.   2. Right ventricular systolic function is moderately reduced. The right  ventricular size is normal. There is mildly elevated pulmonary artery  systolic pressure. The estimated right ventricular systolic pressure is  15.1 mmHg.   3. Left atrial size was mild to moderately dilated.   4. Right atrial size was moderately dilated.   5. The mitral valve is normal in structure. Mild mitral valve  regurgitation. No evidence of mitral stenosis.   6. Tricuspid valve regurgitation is moderate.   7. The aortic valve is tricuspid. Aortic valve regurgitation is mild. No  aortic stenosis is present.   8. There is mild dilatation of the ascending aorta, measuring 38 mm.   9. The inferior vena cava is dilated in size with <50% respiratory  variability, suggesting right atrial pressure of 15 mmHg.   Patient Profile     88 y.o. male  with a hx of HLD, HTN, LBBB, mild COPD, non-hodgkin's lymphoma and GERD who presented with worsening dyspnea on exertion and LE edema found to have new Aflutter with RVR and new heart failure for which Cardiology was consulted.  Assessment & Plan    Atrial Flutter with RVR: New onset, patient has been unaware of his rhythm.  Denies any history of the same.  -- TSH within normal limits  -- Was started on metoprolol 25 mg BID this admission. HR continues to be elevated consistently to 110. BP somewhat soft so there is little room to titrate further -- Patient remains in a flutter after diuresing 8.85  L urine so far. Suspect he will need TEE/DCCV when more euvolemic  -- Continue eliquis 5 mg BID (new this admission)  -- Echocardiogram showed LVEF 30-35%, global hypokinesis, LBBB-related systolic dyssynchrony, moderately reduced RV function, mildly elevated pulmonary artery systolic pressure, moderately dilated RA and LA    Acute on Chronic HFrEF: History of HFpEF, presented  significantly volume overloaded.  BNP 589 and chest x-ray with bilateral effusions. Echo this admission showed newly reduced EF 30-35%, moderately reduced RV function  -- Suspect that drop in EF is related to afib with RVR. Plan TEE/DCCV when more euvolemic  -- Has been on IV lasix '40mg'$  BID since admission. Output 4.25 L urine yesterday and is currently net -8.85 L since admission  -- Creatinine stable at 1.55. Continues to have lower extremity edema, crackles in lungs, but symptoms are improving with diuresis. Continue IV lasix for now  -- Will need an echocardiogram 6 weeks after restoration of NSR-- if patient continues to have a low EF, will need ischemic evaluation at that time  -- Continue metoprolol '25mg'$  BID -- Now on jardiance 10 mg daily  -- BP continues to be somewhat low-- 329-518 systolic. Add GDMT as BP tolerates. May have more BP room when he is off IV diuretics and in NSR   Hypertension:  -- Now on metoprolol 25 mg twice daily. BP is tolerating, but there is little room to titrate further   COPD: No wheezing on exam.   Hyperlipidemia:  -- Patient was taking Simvastatin 40 mg PTA -- LDL 105, HDL 51, triglycerides 67 this admission  -- Transitioned to lipitor 80 mg daily due to LDL of 105. Will need LFTs and lipid panel in 2-3 months        For questions or updates, please contact [facility] Please consult [email or web address] for contact info under        Signed, Freada Bergeron, MD  02/10/2022, 11:55 AM    Patient seen and examined and agree with Vikki Ports, PA-C as detailed above.   In brief, the patient is a 88 y.o. male with a hx of HLD, HTN, LBBB, mild COPD, non-hodgkin's lymphoma and GERD who presented with worsening dyspnea on exertion and LE edema found to have new Aflutter with RVR and new heart failure for which Cardiology was consulted.   Patient followed remotely by Cardiology. TTE in 08/2017 showed LVEF of 60- 65%, no wall motion abnormality, LVH,  grade 1 diastolic dysfunction, mildly dilated right atrium, aortic sclerosis with mild AI.    He presented on this admission with worsening SOB and LE edema found to be in new Aflutter with RVR with evidence of volume overload on exam. BNP 500s.   TTE here with LVEF 30-35%, global hypokinesis, moderately reduced RV systolic function, mild MR, moderate TR, mild AR, mild aortic dilation, RAP 15. Suspect acute drop in EF related to Aflutter with RVR. Has been responding well to IV diuresis. HR okay in 110s. Wt 165 on admission to 152lbs today.  Currently, patient is very somnolent on exam after receiving ativan for agitation overnight. He is not answering questions for me today.   Will plan to continue diuresis and rate control with metoprolol. Once euovolemic, plan for TEE/DCCV. If fails to improve EF despite restoration of NSR, will plan for ischemic work-up at that time   GEN: Elderly male, somnolent and not answering questions for me today Neck: JVD to angle of the mandible Cardiac: Tachycardic, regular, 2/6 systolic murmur Respiratory: Diminished  at the bases GI: Soft, nontender, non-distended  MS: 2+ pitting edema. Warm Neuro:  Somnolent, not following commands Psych: Unable to assess   Plan: -Continue lasix '40mg'$  IV BID; responding well -Continue metop '25mg'$  BID; HR fairly controlled at 110 -Continue jardiance '10mg'$  daily -Add spiro 12.'5mg'$  daily -Continue apixaban '5mg'$  BID for [facility] -Plan for TEE/DCCV once more clinically euvolemic -If fails to improve EF despite restoration of NSR, will plan for ischemic work-up at that time -Will add ACE/ARB/ARNI as able pending BP and renal function   Gwyndolyn Kaufman, MD

## 2022-02-10 NOTE — Progress Notes (Signed)
Progress Note   Patient: Henry Nichols WUJ:[id number] DOB: 04-29-1932 DOA: 02/08/2022     1 DOS: the patient was seen and examined on 02/10/2022   Brief hospital course: 88 year old man PMH diastolic CHF presented with shortness of breath, recently treated for infection on doxycycline presented with leg swelling and new diagnosis of atrial fibrillation.  Admitted for acute on chronic CHF with massive volume overload, new diagnosis atrial fibrillation with RVR.  Repeat echo demonstrating global hypokinesis and systolic/diastolic HF with EF 95-62%. Seen by cardiology, overall responding well to diuresis.  Continue to be on A. Fib/A. Flutter; with improvements in his rate. May require TEE.   Assessment and Plan: * Acute on chronic combined systolic and diastolic CHF (congestive heart failure) (HCC) - some improvement appreciated in patient's right; will continue to be on atrial fib/atrial flutter. -Repeat echo demonstrating combined systolic and diastolic heart failure, ejection fraction 30-35% and global hypokinesis.  Most likely arrhythmia mediated.   -Patient denies chest pain.   -Continue metoprolol, initiate Jardiance and continue diuresis. -Further medical optimization essentially alcohol in the setting of acute exacerbation and soft blood pressure.   -Cardiology service is on board and will follow recommendations.   -Repeat chest x-ray and BNP in a.m. -Daily weights, low-sodium diet strict I's and O's -Follow clinical response.   Paroxysmal atrial fibrillation with RVR ([place]) -New onset atrial fib/flutter with RVR with shortness of breath, and CHD. -CHADsVASC score 4 -continue metoprolol and eliquis -will follow cardiology recommendations. -patient might need cardioversion.    CKD stage IIIa -with AKI component in the setting of decrease perfusion with acute CHF and diuresis -Baseline creatinine of 1.2-1.4 per chart review  -will follow up Cr closely while diuresing -patient  reports good urine output -current Cr 1.55   Demand ischemia (HCC) -Troponin flat.  -no CP -most likely in the setting of CHF exacerbation and arrhythmia -cardiology on board and will follow rec's -continue telemetry monitoring -continue Zocor and metoprolol -aspirin stopped after initiation of anticoagulation.  Essential hypertension -soft but stable -continue close monitoring -receiving lopressor and lasix currently.    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 -No type B symptoms currently.  -S/p Rituxan weekly x 4 doses in 2018  -continue to follow up with oncology as an outpatient; per last follow up visit note there was No clinical or lab evidence of follicular lymphoma progression.   Subjective:  Sleepy/somnolent; per chart review overnight and is requiring Ativan for agitation.  No chest pain, + nausea, no vomiting patient is afebrile.  Physical Exam: Vitals:   02/09/22 1442 02/09/22 1849 02/09/22 2000 02/10/22 0328  BP: 106/79  102/72 120/82  Pulse: (!) 106 (!) 110 100 100  Resp:   20 19  Temp: 98.1 F (36.7 C) 98.2 F (36.8 C) 98 F (36.7 C) 97.9 F (36.6 C)  TempSrc: Oral Oral Oral Axillary  SpO2: 99% 99% 98% 97%  Weight:    69.1 kg  Height:       General exam: Somnolent, sleepy; following simple commands.  No chest pain, no nausea, no vomiting.  Reports breathing is better.  Saturation on room air. Respiratory system: Decreased breath sounds at the bases; no using accessory muscles.  Cardiovascular system:irregular, rate better controlled, no rubs, no gallops. Positive JVD. Gastrointestinal system: Abdomen is nondistended, soft and nontender. No organomegaly or masses felt. Normal bowel sounds heard. Central nervous system: Alert and  oriented. No focal neurological deficits. Extremities: No cyanosis, no clubbing; 1 + edema on  exam bilaterally. Skin: No rashes, no petechiae. Psychiatry: Judgement and insight appear normal. Mood & affect appropriate.   Data Reviewed: 2D echo: Demonstrating reduced ejection fraction 30-35%; diffuse global hypokinesis. Basic metabolic panel: Sodium 737, potassium 3.5, BUN 25, and creatinine 1.55   Family Communication: no family at bedside.  Disposition: Status is: Inpatient Remains inpatient appropriate because: still requiring IV diuresis, cardiac management optimization and very likely cardioversion.  Planned Discharge Destination: Home   Author: Barton Dubois, MD 02/10/2022 7:39 AM  For on call review [email or web address].

## 2022-02-11 ENCOUNTER — Inpatient Hospital Stay (HOSPITAL_COMMUNITY): Payer: Medicare Other

## 2022-02-11 DIAGNOSIS — J90 Pleural effusion, not elsewhere classified: Secondary | ICD-10-CM

## 2022-02-11 DIAGNOSIS — I5043 Acute on chronic combined systolic (congestive) and diastolic (congestive) heart failure: Secondary | ICD-10-CM | POA: Diagnosis not present

## 2022-02-11 DIAGNOSIS — I480 Paroxysmal atrial fibrillation: Secondary | ICD-10-CM | POA: Diagnosis not present

## 2022-02-11 DIAGNOSIS — I248 Other forms of acute ischemic heart disease: Secondary | ICD-10-CM | POA: Diagnosis not present

## 2022-02-11 DIAGNOSIS — I10 Essential (primary) hypertension: Secondary | ICD-10-CM | POA: Diagnosis not present

## 2022-02-11 DIAGNOSIS — C8293 Follicular lymphoma, unspecified, intra-abdominal lymph nodes: Secondary | ICD-10-CM

## 2022-02-11 LAB — BASIC METABOLIC PANEL
Anion gap: 14 (ref 5–15)
BUN: 20 mg/dL (ref 8–23)
CO2: 37 mmol/L — ABNORMAL HIGH (ref 22–32)
Calcium: 9.3 mg/dL (ref 8.9–10.3)
Chloride: 91 mmol/L — ABNORMAL LOW (ref 98–111)
Creatinine, Ser: 1.36 mg/dL — ABNORMAL HIGH (ref 0.61–1.24)
GFR, Estimated: 50 mL/min — ABNORMAL LOW (ref >60)
Glucose, Bld: 88 mg/dL (ref 70–99)
Potassium: 3.2 mmol/L — ABNORMAL LOW (ref 3.5–5.1)
Sodium: 142 mmol/L (ref 135–145)

## 2022-02-11 LAB — BRAIN NATRIURETIC PEPTIDE: B Natriuretic Peptide: 289.9 pg/mL — ABNORMAL HIGH (ref 0.0–100.0)

## 2022-02-11 MED ORDER — POTASSIUM CHLORIDE CRYS ER 20 MEQ PO TBCR
40.0000 meq | EXTENDED_RELEASE_TABLET | Freq: Once | ORAL | Status: AC
  Administered 2022-02-11: 40 meq via ORAL
  Filled 2022-02-11: qty 2

## 2022-02-11 MED ORDER — METOPROLOL TARTRATE 12.5 MG HALF TABLET
12.5000 mg | ORAL_TABLET | Freq: Two times a day (BID) | ORAL | Status: DC
  Administered 2022-02-11: 12.5 mg via ORAL
  Filled 2022-02-11: qty 1

## 2022-02-11 MED ORDER — METOPROLOL TARTRATE 12.5 MG HALF TABLET
12.5000 mg | ORAL_TABLET | Freq: Two times a day (BID) | ORAL | Status: DC
  Administered 2022-02-11 – 2022-02-14 (×6): 12.5 mg via ORAL
  Filled 2022-02-11 (×6): qty 1

## 2022-02-11 MED ORDER — METOPROLOL TARTRATE 25 MG PO TABS: 25.0000 mg | ORAL_TABLET | Freq: Two times a day (BID) | ORAL | Status: DC

## 2022-02-11 MED ORDER — METOPROLOL TARTRATE 5 MG/5ML IV SOLN
2.5000 mg | Freq: Four times a day (QID) | INTRAVENOUS | Status: DC
  Administered 2022-02-11: 2.5 mg via INTRAVENOUS
  Filled 2022-02-11: qty 5

## 2022-02-11 NOTE — Progress Notes (Signed)
Progress Note   Patient: Henry Nichols FIE:[id number] DOB: [birth year] DOA: 02/08/2022     2 DOS: the patient was seen and examined on 02/11/2022   Brief hospital course: 88 year old man PMH diastolic CHF presented with shortness of breath, recently treated for infection on doxycycline presented with leg swelling and new diagnosis of atrial fibrillation.  Admitted for acute on chronic CHF with massive volume overload, new diagnosis atrial fibrillation with RVR.  Repeat echo demonstrating global hypokinesis and systolic/diastolic HF with EF 16-60%. Seen by cardiology, overall responding well to diuresis.  Continue to be on A. Fib/A. Flutter; with improvements in his rate. May require TEE.   Assessment and Plan: * Acute on chronic combined systolic and diastolic CHF (congestive heart failure) (HCC) - some improvement appreciated in patient's right; will continue to be on atrial fib/atrial flutter. -Repeat echo demonstrating combined systolic and diastolic heart failure, ejection fraction 30-35% and global hypokinesis.  Most likely arrhythmia mediated.   -Patient denies chest pain.   -Continue metoprolol, initiate Jardiance and continue diuresis; now orally. -Further GMDT and rate control agents in the setting of acute exacerbation and soft blood pressure as per cardiology service..   -Cardiology service is on board and will follow their recommendations.   -BNP 289.9 -continue Daily weights, low-sodium diet strict I's and O's -Follow clinical response.   Paroxysmal atrial fibrillation with RVR ([place]) -New onset atrial fib/flutter with RVR with shortness of breath, and CHD. -CHADsVASC score 4 -continue metoprolol and eliquis -will follow cardiology recommendations. -patient might need cardioversion.    CKD stage IIIa -with AKI component in the setting of decrease perfusion with acute CHF and diuresis -Baseline creatinine of 1.2-1.4 per chart review  -will follow up Cr closely while  diuresing -patient reports good urine output -current Cr 1.55   Demand ischemia (HCC) -Troponin flat.  -no CP -most likely in the setting of CHF exacerbation and arrhythmia -cardiology on board and will follow rec's -continue telemetry monitoring -continue Zocor and metoprolol -aspirin stopped after initiation of anticoagulation.  Essential hypertension -soft but stable -continue close monitoring -receiving lopressor and lasix currently.    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 -No type B symptoms currently.  -S/p Rituxan weekly x 4 doses in 2018  -continue to follow up with oncology as an outpatient; per last follow up visit note there was No clinical or lab evidence of follicular lymphoma progression.   Subjective:  Afebrile, no chest pain, no nausea, no vomiting.  Today awake oriented following commands appropriately and feeling better.  Patient expressed no palpitations.  Per telemetry evaluation heart rate with better control but is still irregular rhythm.  Physical Exam: Vitals:   02/10/22 1550 02/10/22 1903 02/11/22 0418 02/11/22 1319  BP: 110/82 107/87 99/60 116/69  Pulse: (!) 110 (!) 110 (!) 110 (!) 115  Resp: '18 18 17 18  '$ Temp: 98 F (36.7 C) (!) 97.4 F (36.3 C) 97.7 F (36.5 C) (!) 97.5 F (36.4 C)  TempSrc: Oral Oral Oral Oral  SpO2: 100% 96% 100% 97%  Weight:   62.8 kg   Height:       General exam: Alert, awake, oriented x 3; feeling better and denying chest pain or palpitations.  Following commands appropriately.  No requiring oxygen supplementation. Respiratory system: Good air movement bilaterally, no wheezing or crackles on exam. Cardiovascular system: Irregular, no rubs, no gallops, no JVD on exam. Gastrointestinal  system: Abdomen is nondistended, soft and nontender. No organomegaly or masses felt. Normal bowel  sounds heard. Central nervous system: Alert and oriented. No focal neurological deficits. Extremities: No cyanosis or clubbing.  This edema bilaterally appreciated. Skin: No petechiae. Psychiatry: Judgement and insight appear normal. Mood & affect appropriate.   Data Reviewed: 2D echo: Demonstrating reduced ejection fraction 30-35%; diffuse global hypokinesis. Basic metabolic panel: Sodium 130, potassium 3.5, BUN 25, and creatinine 1.55   Family Communication: no family at bedside.  Disposition: Status is: Inpatient Remains inpatient appropriate because: still requiring IV diuresis, cardiac management optimization and very likely cardioversion.  Planned Discharge Destination: Home   Author: Barton Dubois, MD 02/11/2022 2:57 PM  For on call review [email or web address].

## 2022-02-11 NOTE — Progress Notes (Signed)
Heart Failure Stewardship Pharmacist Progress Note   PCP: [name], MD PCP-Cardiologist: Freada Bergeron, MD    HPI:  88 yo M with PMH of HTN, HLD, LBBB, mild COPD, non-hodgkin's lymphoma, and GERD.  He presented to the ED on 6/26 with shortness of breath, BLE edema, and orthopnea. He went to see his PCP and was found to be in afib RVR and directed him to the ED for evaluation. Last ECHO in 08/2017 with LVEF 60-65%. CXR on admission with new small bilateral pleural effusions and mild interstitial edema. ECHO on 6/27 with LVEF reduced to 30-35%, mild LVH, RV moderately reduced, mildly elevated pulmonary artery pressures, mild MR and mild AR.   Pending TEE/DCCV once more euvolemic - anticipated for 6/30.  Current HF Medications: Diuretic: furosemide 40 mg IV BID Beta Blocker: metoprolol 2.5 mg IV q6h Aldosterone Antagonist: spironolactone 12.5 mg daily SGLT2i: Jardiance 10 mg daily  Prior to admission HF Medications: None  Pertinent Lab Values: Serum creatinine 1.36, BUN 20, Potassium 3.2, Sodium 142, BNP 598.9>289.9, Magnesium 2.1   Vital Signs: Weight: 138 lbs (admission weight: 161 lbs) Blood pressure: 100/60s  Heart rate: 110s - afib  Output (not documenting input): -6.1L yesterday; since admission -14.6L  Medication Assistance / Insurance Benefits Check: Does the patient have prescription insurance?  Yes Type of insurance plan: [employer] Medicare  Does the patient qualify for medication assistance through manufacturers or grants?   Yes Eligible grants and/or patient assistance programs: Jardiance Medication assistance applications in progress: Jardiance Medication assistance applications approved: none Approved medication assistance renewals will be completed by: [facility]:  Prior to admission outpatient pharmacy: [facility] Is the patient willing to use [facility] at discharge? Yes Is the patient willing to transition their outpatient pharmacy  to utilize a [facility] outpatient pharmacy?   Pending    Assessment: 1. Acute on chronic systolic CHF (LVEF 16-07%), due to presumed afib RVR. Pending TEE/DCCV once more euvolemic. NYHA class III symptoms. - Excellent diuresis. Weight down and good urine output. Continue furosemide 40 mg IV BID. Keep K>4 and Mag>2. KCl 40 mEq x 2 ordered for replacement. - Continue IV metoprolol for rate control until TEE/DCCV tomorrow. Switch to metoprolol XL prior to discharge once dose determined.  - No Entresto yet with low BP - Continue spironolactone 12.5 mg daily - Continue Jardiance 10 mg daily - Can consider starting digoxin for RV and afib - caution with age and unclear baseline renal function. SCr down 1.36 today - was 1.19 in March.   Plan: 1) Medication changes recommended at this time: - Agree with changes  2) Patient assistance: Delene Loll copay $45 -Jardiance $45 -Patient assistance started for Jardiance - patient portion completed and signed -Eliquis patient assistance would require 3% of annual income to be spent at the pharmacy first. Patient is unable to report how much he thinks he has spent. He states he will look into this after discharge. Information provided.   3)  Education  - Patient has been educated on current HF medications and potential additions to HF medication regimen - Patient verbalizes understanding that over the next few months, these medication doses may change and more medications may be added to optimize HF regimen - Patient has been educated on basic disease state pathophysiology and goals of therapy   Kerby Nora, PharmD, BCPS Heart Failure Stewardship Pharmacist Phone [phone]

## 2022-02-11 NOTE — Progress Notes (Signed)
Mobility Specialist Progress Note    02/11/22 1658  Mobility  Activity Ambulated with assistance in hallway  Level of Assistance Contact guard assist, steadying assist  Assistive Device Front wheel walker  Distance Ambulated (ft) 350 ft  Activity Response Tolerated well  $Mobility charge 1 Mobility   Pre-Mobility: 117 HR, 99% SpO2 Post-Mobility: 120 HR  Pt received in bed and agreeable. No complaints on walk. Returned to chair with call bell in reach and alarm on.     [facility] Mobility Specialist

## 2022-02-11 NOTE — Progress Notes (Addendum)
Progress Note  Patient Name: Henry Nichols Date of Encounter: 02/11/2022  [facility] Cardiologist: Freada Bergeron, MD   Subjective   Patient sitting comfortably in the bed enjoying his breakfast. Denies any chest pain, palpitations, SOB.   Inpatient Medications    Scheduled Meds:  apixaban  5 mg Oral BID   atorvastatin  80 mg Oral Daily   empagliflozin  10 mg Oral Daily   furosemide  40 mg Intravenous BID   melatonin  10 mg Oral QHS   metoprolol tartrate  2.5 mg Intravenous Q6H   multivitamin  1 tablet Oral Daily   potassium chloride  40 mEq Oral Once   sodium chloride flush  3 mL Intravenous Q12H   spironolactone  12.5 mg Oral Daily   Continuous Infusions:  sodium chloride     PRN Meds: sodium chloride, acetaminophen **OR** acetaminophen, haloperidol lactate, sodium chloride flush   Vital Signs    Vitals:   02/10/22 1450 02/10/22 1550 02/10/22 1903 02/11/22 0418  BP: 107/73 110/82 107/87 99/60  Pulse:  (!) 110 (!) 110 (!) 110  Resp: (!) '23 18 18 17  '$ Temp:  98 F (36.7 C) (!) 97.4 F (36.3 C) 97.7 F (36.5 C)  TempSrc:  Oral Oral Oral  SpO2: 99% 100% 96% 100%  Weight:    62.8 kg  Height:        Intake/Output Summary (Last 24 hours) at 02/11/2022 0947 Last data filed at 02/11/2022 0052 Gross per 24 hour  Intake 0 ml  Output 5700 ml  Net -5700 ml      02/11/2022    4:18 AM 02/10/2022    3:28 AM 02/09/2022    5:53 AM  Last 3 Weights  Weight (lbs) 138 lb 7.2 oz 152 lb 4.8 oz 161 lb 13.1 oz  Weight (kg) 62.8 kg 69.083 kg 73.4 kg      Telemetry    Atrial Fibrillation, HR 115 - Personally Reviewed  ECG    No new tracings - Personally Reviewed  Physical Exam   GEN: No acute distress.  Sitting upright in the bed eating breakfast  Neck: No JVD Cardiac: Regular rhythm, tachycardic. Faint systolic murmur.  Respiratory: Clear to auscultation bilaterally  GI: Soft, nontender, non-distended  MS: 1-2+ pitting edema in BLE; No  deformity. Neuro:  Nonfocal  Psych: Normal affect   Labs    High Sensitivity Troponin:   Recent Labs  Lab 02/08/22 1407 02/08/22 1553  TROPONINIHS 31* 30*     Chemistry Recent Labs  Lab 02/08/22 1407 02/08/22 1553 02/09/22 0036 02/10/22 0223 02/11/22 0427  NA 139  --  140 139 142  K 4.0  --  3.9 3.5 3.2*  CL 104  --  102 95* 91*  CO2 28  --  30 33* 37*  GLUCOSE 115*  --  126* 110* 88  BUN 13  --  18 25* 20  CREATININE 1.44*  --  1.55* 1.55* 1.36*  CALCIUM 9.1  --  9.1 9.1 9.3  MG  --  2.1  --   --   --   PROT 6.5  --   --   --   --   ALBUMIN 3.1*  --   --   --   --   AST 31  --   --   --   --   ALT 22  --   --   --   --   ALKPHOS 74  --   --   --   --  BILITOT 1.6*  --   --   --   --   GFRNONAA 47*  --  43* 43* 50*  ANIONGAP 7  --  '8 11 14    '$ Lipids  Recent Labs  Lab 02/09/22 0036  CHOL 169  TRIG 67  HDL 51  LDLCALC 105*  CHOLHDL 3.3    Hematology Recent Labs  Lab 02/08/22 1407 02/09/22 0036  WBC 5.1 4.7  RBC 3.75* 3.80*  HGB 12.1* 11.9*  HCT 37.7* 37.6*  MCV 100.5* 98.9  MCH 32.3 31.3  MCHC 32.1 31.6  RDW 15.3 15.4  PLT 282 274   Thyroid  Recent Labs  Lab 02/09/22 0036  TSH 3.648    BNP Recent Labs  Lab 02/08/22 1407 02/11/22 0427  BNP 598.9* 289.9*    DDimer No results for input(s): "DDIMER" in the last 168 hours.   Radiology    DG Chest 2 View  Result Date: 02/11/2022 CLINICAL DATA:  Shortness of breath EXAM: CHEST - 2 VIEW COMPARISON:  Previous studies including the examination of 02/09/2019 FINDINGS: Transverse diameter of heart is increased. Central pulmonary vessels are prominent. There are no signs of alveolar pulmonary edema. Small to moderate bilateral pleural effusions are seen, more so on the right side. Evaluation of lower lung fields for infiltrates is limited by the effusions. There are no signs of alveolar pulmonary edema. There is no pneumothorax. IMPRESSION: Cardiomegaly. Bilateral pleural effusions, more so on the  right side. Possibility of underlying atelectasis/pneumonia in the lower lung fields is not excluded. Electronically Signed   By: Elmer Picker M.D.   On: 02/11/2022 08:28   ECHOCARDIOGRAM COMPLETE  Result Date: 02/09/2022    ECHOCARDIOGRAM REPORT   Patient Name:   Henry Nichols Date of Exam: 02/09/2022 Medical Rec #:  [id number]        Height:       70.0 in Accession #:    [id number]       Weight:       161.8 lb Date of Birth:  1932-02-27       BSA:          1.908 m Patient Age:    57 years         BP:           100/77 mmHg Patient Gender: M                HR:           110 bpm. Exam Location:  Inpatient Procedure: 2D Echo, Cardiac Doppler and Color Doppler Indications:    CHF  History:        Patient has no prior history of Echocardiogram examinations.                 Arrythmias:Atrial Fibrillation and LBBB; Risk                 Factors:Hypertension.  Sonographer:    Jyl Heinz Referring Phys: [place]  1. There is severe LBBB-related systolic dyssynchrony. Left ventricular ejection fraction, by estimation, is 30 to 35%. The left ventricle has moderately decreased function. The left ventricle demonstrates global hypokinesis. There is mild concentric left ventricular hypertrophy. Left ventricular diastolic function could not be evaluated.  2. Right ventricular systolic function is moderately reduced. The right ventricular size is normal. There is mildly elevated pulmonary artery systolic pressure. The estimated right ventricular systolic pressure is 58.5 mmHg.  3. Left atrial size was mild  to moderately dilated.  4. Right atrial size was moderately dilated.  5. The mitral valve is normal in structure. Mild mitral valve regurgitation. No evidence of mitral stenosis.  6. Tricuspid valve regurgitation is moderate.  7. The aortic valve is tricuspid. Aortic valve regurgitation is mild. No aortic stenosis is present.  8. There is mild dilatation of the ascending aorta, measuring  38 mm.  9. The inferior vena cava is dilated in size with <50% respiratory variability, suggesting right atrial pressure of 15 mmHg. FINDINGS  Left Ventricle: There is severe LBBB-related systolic dyssynchrony. Left ventricular ejection fraction, by estimation, is 30 to 35%. The left ventricle has moderately decreased function. The left ventricle demonstrates global hypokinesis. The left ventricular internal cavity size was normal in size. There is mild concentric left ventricular hypertrophy. Abnormal (paradoxical) septal motion, consistent with left bundle branch block. Left ventricular diastolic function could not be evaluated due to atrial fibrillation. Left ventricular diastolic function could not be evaluated. Right Ventricle: The right ventricular size is normal. No increase in right ventricular wall thickness. Right ventricular systolic function is moderately reduced. There is mildly elevated pulmonary artery systolic pressure. The tricuspid regurgitant velocity is 2.82 m/s, and with an assumed right atrial pressure of 15 mmHg, the estimated right ventricular systolic pressure is 16.6 mmHg. Left Atrium: Left atrial size was mild to moderately dilated. Right Atrium: Right atrial size was moderately dilated. Pericardium: There is no evidence of pericardial effusion. Mitral Valve: The mitral valve is normal in structure. Mild mitral annular calcification. Mild mitral valve regurgitation, with centrally-directed jet. No evidence of mitral valve stenosis. Tricuspid Valve: The tricuspid valve is normal in structure. Tricuspid valve regurgitation is moderate. Aortic Valve: The aortic valve is tricuspid. Aortic valve regurgitation is mild. Aortic regurgitation PHT measures 463 msec. No aortic stenosis is present. Aortic valve peak gradient measures 2.7 mmHg. Pulmonic Valve: The pulmonic valve was grossly normal. Pulmonic valve regurgitation is not visualized. Aorta: The aortic root is normal in size and structure.  There is mild dilatation of the ascending aorta, measuring 38 mm. Venous: The inferior vena cava is dilated in size with less than 50% respiratory variability, suggesting right atrial pressure of 15 mmHg. IAS/Shunts: No atrial level shunt detected by color flow Doppler.  LEFT VENTRICLE PLAX 2D LVIDd:         3.70 cm     Diastology LVIDs:         2.80 cm     LV e' medial:    6.53 cm/s LV PW:         1.20 cm     LV E/e' medial:  12.5 LV IVS:        1.10 cm     LV e' lateral:   6.96 cm/s LVOT diam:     2.00 cm     LV E/e' lateral: 11.7 LV SV:         32 LV SV Index:   17 LVOT Area:     3.14 cm  LV Volumes (MOD) LV vol d, MOD A2C: 71.2 ml LV vol d, MOD A4C: 79.4 ml LV vol s, MOD A2C: 39.9 ml LV vol s, MOD A4C: 45.8 ml LV SV MOD A2C:     31.3 ml LV SV MOD A4C:     79.4 ml LV SV MOD BP:      34.2 ml RIGHT VENTRICLE            IVC RV Basal diam:  3.50 cm  IVC diam: 2.40 cm RV Mid diam:    2.90 cm RV S prime:     6.31 cm/s TAPSE (M-mode): 1.3 cm LEFT ATRIUM             Index        RIGHT ATRIUM           Index LA diam:        4.20 cm 2.20 cm/m   RA Area:     26.40 cm LA Vol (A2C):   47.0 ml 24.64 ml/m  RA Volume:   86.50 ml  45.34 ml/m LA Vol (A4C):   71.1 ml 37.27 ml/m LA Biplane Vol: 60.4 ml 31.66 ml/m  AORTIC VALVE AV Area (Vmax): 2.52 cm AV Vmax:        81.40 cm/s AV Peak Grad:   2.7 mmHg LVOT Vmax:      65.35 cm/s LVOT Vmean:     48.950 cm/s LVOT VTI:       0.103 m AI PHT:         463 msec  AORTA Ao Root diam: 3.70 cm Ao Asc diam:  3.80 cm MITRAL VALVE               TRICUSPID VALVE MV Area (PHT): 5.13 cm    TR Peak grad:   31.8 mmHg MV Decel Time: 148 msec    TR Vmax:        282.00 cm/s MR Peak grad: 71.3 mmHg MR Mean grad: 50.0 mmHg    SHUNTS MR Vmax:      422.33 cm/s  Systemic VTI:  0.10 m MR Vmean:     345.0 cm/s   Systemic Diam: 2.00 cm MV E velocity: 81.40 cm/s MV A velocity: 52.70 cm/s MV E/A ratio:  1.54 Mihai Croitoru MD Electronically signed by Sanda Klein MD Signature Date/Time:  02/09/2022/11:14:44 AM    Final     Cardiac Studies   Echocardiogram 02/09/22   1. There is severe LBBB-related systolic dyssynchrony. Left ventricular  ejection fraction, by estimation, is 30 to 35%. The left ventricle has  moderately decreased function. The left ventricle demonstrates global  hypokinesis. There is mild concentric  left ventricular hypertrophy. Left ventricular diastolic function could  not be evaluated.   2. Right ventricular systolic function is moderately reduced. The right  ventricular size is normal. There is mildly elevated pulmonary artery  systolic pressure. The estimated right ventricular systolic pressure is  66.5 mmHg.   3. Left atrial size was mild to moderately dilated.   4. Right atrial size was moderately dilated.   5. The mitral valve is normal in structure. Mild mitral valve  regurgitation. No evidence of mitral stenosis.   6. Tricuspid valve regurgitation is moderate.   7. The aortic valve is tricuspid. Aortic valve regurgitation is mild. No  aortic stenosis is present.   8. There is mild dilatation of the ascending aorta, measuring 38 mm.   9. The inferior vena cava is dilated in size with <50% respiratory  variability, suggesting right atrial pressure of 15 mmHg.   Patient Profile     88 y.o. male  with a hx of HLD, HTN, LBBB, mild COPD, non-hodgkin's lymphoma and GERD who presented with worsening dyspnea on exertion and LE edema found to have new Aflutter with RVR and new heart failure for which Cardiology was consulted.  Assessment & Plan    Atrial Flutter with RVR: New onset, patient has been unaware of his rhythm.  Denies any  history of the same.  -- TSH within normal limits  -- Was started on metoprolol 25 mg BID this admission. HR continues to be elevated consistently to 110-115. BP somewhat soft so there is little room to titrate further -- Patient remains in a flutter after diuresing 14.5 L so far  -- As volume status has improved  significantly, I expect patient will be able to tolerate TEE/DCCV soon. Tentatively scheduled for tomorrow. NPO at midnight (order has been placed)  -- Continue eliquis 5 mg BID (new this admission)  -- Echocardiogram showed LVEF 30-35%, global hypokinesis, LBBB-related systolic dyssynchrony, moderately reduced RV function, mildly elevated pulmonary artery systolic pressure, moderately dilated RA and LA    Acute on Chronic HFrEF: History of HFpEF, presented significantly volume overloaded.  BNP 589 and chest x-ray with bilateral effusions on presentation. -- Echo this admission showed newly reduced EF 30-35%, moderately reduced RV function  -- Suspect that drop in EF is related to afib with RVR. Plan TEE/DCCV  -- Has been on IV lasix '40mg'$  BID since admission. Output 5.7 L urine yesterday and is currently net -14.5 L since admission. BNP down from 598>>289  -- Creatinine stable at 1.36. Continues to have lower extremity edema, although symptoms have significantly improved with diuresis. Continue IV lasix for now, although I suspect he can be transitioned to oral soon (likely tomorrow)  -- Will need an echocardiogram 6 weeks after restoration of NSR-- if patient continues to have a low EF, will need ischemic evaluation at that time  -- Continue metoprolol '25mg'$  BID -- Now on jardiance 10 mg daily and spironolactone 12.5 mg daily  -- BP is stable but somewhat low. May not tolerate ACEi, ARB, ARNI at this time    Hypertension:  -- Now on metoprolol 25 mg twice daily. BP is tolerating, but there is little room to titrate further   COPD: No wheezing on exam.   Hyperlipidemia:  -- Patient was taking Simvastatin 40 mg PTA -- LDL 105, HDL 51, triglycerides 67 this admission  -- Transitioned to lipitor 80 mg daily due to LDL of 105. Will need LFTs and lipid panel in 2-3 months       For questions or updates, please contact [place] Please consult [email or web address] for contact info under         Signed, Margie Billet, PA-C  02/11/2022, 9:47 AM    Patient seen and examined and agree with Vikki Ports, PA-C as detailed above.   In brief, the patient is a 88 y.o. male with a hx of HLD, HTN, LBBB, mild COPD, non-hodgkin's lymphoma and GERD who presented with worsening dyspnea on exertion and LE edema found to have new Aflutter with RVR and new heart failure for which Cardiology was consulted.   Patient followed remotely by Cardiology. TTE in 08/2017 showed LVEF of 60- 65%, no wall motion abnormality, LVH, grade 1 diastolic dysfunction, mildly dilated right atrium, aortic sclerosis with mild AI.    He presented on this admission with worsening SOB and LE edema found to be in new Aflutter with RVR with evidence of volume overload on exam. BNP 500s.   TTE here with LVEF 30-35%, global hypokinesis, moderately reduced RV systolic function, mild MR, moderate TR, mild AR, mild aortic dilation, RAP 15. Suspect acute drop in EF related to Aflutter with RVR. Has been responding well to IV diuresis. Net negative 5.7L. Cr down-trending from 1.55>1.36. HR okay in 110s. Wt 165 on admission to 138lbs today.  Was somnolent yesterday after being agitated overnight and receiving ativan. Was not able to take PO meds and temporarily was switched to IV metop.   Today, he appears remarkably better. Volume status significantly improved and appears euvolemic. Will stop IV lasix and transition to PO tomorrow pending renal function. Will see if there is availability for TEE/DCCV tomorrow. If fails to improve EF despite restoration of NSR, will plan for ischemic work-up at that time.   GEN: Elderly male, comfortable, alert and awake Neck: No JVD Cardiac: Tachycardic, regular, 2/6 systolic murmur Respiratory: CTAB GI: Soft, nontender, non-distended  MS: Trace pitting edema, warm Neuro:  AAOx3 Psych: Normal   Plan: -Volume status markedly improved; will see if availability for TEE/DCCV tomorrow -Stop  IV lasix and transition to PO tomorrow pending renal function and volume status -Restart metop 12.'5mg'$  BID with holding parameters given soft blood pressure -Continue jardiance '10mg'$  daily -Hold spiro for now given soft blood pressures -Continue apixaban '5mg'$  BID for AC -If fails to improve EF despite restoration of NSR, will plan for ischemic work-up at that time -Will add ACE/ARB/ARNI as able pending BP and renal function   Gwyndolyn Kaufman, MD

## 2022-02-12 ENCOUNTER — Inpatient Hospital Stay (HOSPITAL_COMMUNITY): Payer: Medicare Other

## 2022-02-12 ENCOUNTER — Encounter (HOSPITAL_COMMUNITY)
Admission: EM | Disposition: A | Discharge status: Home / Self Care | Payer: Self-pay | Source: Home / Self Care | Attending: Internal Medicine

## 2022-02-12 ENCOUNTER — Encounter (HOSPITAL_COMMUNITY): Payer: Self-pay | Admitting: Family Medicine

## 2022-02-12 ENCOUNTER — Inpatient Hospital Stay (HOSPITAL_COMMUNITY): Payer: Medicare Other | Admitting: Anesthesiology

## 2022-02-12 DIAGNOSIS — I361 Nonrheumatic tricuspid (valve) insufficiency: Secondary | ICD-10-CM

## 2022-02-12 DIAGNOSIS — I351 Nonrheumatic aortic (valve) insufficiency: Secondary | ICD-10-CM

## 2022-02-12 DIAGNOSIS — I248 Other forms of acute ischemic heart disease: Secondary | ICD-10-CM | POA: Diagnosis not present

## 2022-02-12 DIAGNOSIS — I2510 Atherosclerotic heart disease of native coronary artery without angina pectoris: Secondary | ICD-10-CM

## 2022-02-12 DIAGNOSIS — I4891 Unspecified atrial fibrillation: Secondary | ICD-10-CM

## 2022-02-12 DIAGNOSIS — I110 Hypertensive heart disease with heart failure: Secondary | ICD-10-CM

## 2022-02-12 DIAGNOSIS — I4892 Unspecified atrial flutter: Secondary | ICD-10-CM | POA: Diagnosis not present

## 2022-02-12 DIAGNOSIS — I5043 Acute on chronic combined systolic (congestive) and diastolic (congestive) heart failure: Secondary | ICD-10-CM | POA: Diagnosis not present

## 2022-02-12 DIAGNOSIS — I480 Paroxysmal atrial fibrillation: Secondary | ICD-10-CM | POA: Diagnosis not present

## 2022-02-12 DIAGNOSIS — I10 Essential (primary) hypertension: Secondary | ICD-10-CM | POA: Diagnosis not present

## 2022-02-12 HISTORY — PX: TEE WITHOUT CARDIOVERSION: SHX5443

## 2022-02-12 HISTORY — PX: CARDIOVERSION: SHX1299

## 2022-02-12 LAB — BASIC METABOLIC PANEL
Anion gap: 11 (ref 5–15)
BUN: 22 mg/dL (ref 8–23)
CO2: 36 mmol/L — ABNORMAL HIGH (ref 22–32)
Calcium: 8.9 mg/dL (ref 8.9–10.3)
Chloride: 91 mmol/L — ABNORMAL LOW (ref 98–111)
Creatinine, Ser: 1.61 mg/dL — ABNORMAL HIGH (ref 0.61–1.24)
GFR, Estimated: 41 mL/min — ABNORMAL LOW (ref >60)
Glucose, Bld: 117 mg/dL — ABNORMAL HIGH (ref 70–99)
Potassium: 3.7 mmol/L (ref 3.5–5.1)
Sodium: 138 mmol/L (ref 135–145)

## 2022-02-12 LAB — PROTIME-INR
INR: 1.7 — ABNORMAL HIGH (ref 0.8–1.2)
Prothrombin Time: 19.8 seconds — ABNORMAL HIGH (ref 11.4–15.2)

## 2022-02-12 SURGERY — ECHOCARDIOGRAM, TRANSESOPHAGEAL
Anesthesia: Monitor Anesthesia Care

## 2022-02-12 MED ORDER — BUTAMBEN-TETRACAINE-BENZOCAINE 2-2-14 % EX AERO
INHALATION_SPRAY | CUTANEOUS | Status: DC | PRN
  Administered 2022-02-12: 2 via TOPICAL

## 2022-02-12 MED ORDER — PHENYLEPHRINE 80 MCG/ML (10ML) SYRINGE FOR IV PUSH (FOR BLOOD PRESSURE SUPPORT)
PREFILLED_SYRINGE | INTRAVENOUS | Status: DC | PRN
  Administered 2022-02-12: 160 ug via INTRAVENOUS
  Administered 2022-02-12 (×2): 80 ug via INTRAVENOUS

## 2022-02-12 MED ORDER — SODIUM CHLORIDE 0.9 % IV SOLN: INTRAVENOUS | Status: DC

## 2022-02-12 MED ORDER — POTASSIUM CHLORIDE CRYS ER 20 MEQ PO TBCR
40.0000 meq | EXTENDED_RELEASE_TABLET | Freq: Once | ORAL | Status: AC
Start: 2022-02-12 — End: 2022-02-12
  Administered 2022-02-12: 40 meq via ORAL
  Filled 2022-02-12: qty 2

## 2022-02-12 MED ORDER — PROPOFOL 500 MG/50ML IV EMUL
INTRAVENOUS | Status: DC | PRN
  Administered 2022-02-12: 80 ug/kg/min via INTRAVENOUS

## 2022-02-12 MED ORDER — PROPOFOL 10 MG/ML IV BOLUS
INTRAVENOUS | Status: DC | PRN
  Administered 2022-02-12: 30 mg via INTRAVENOUS

## 2022-02-12 NOTE — Progress Notes (Signed)
Progress Note   Patient: Henry Nichols PJK:[id number] DOB: July 22, 1932 DOA: 02/08/2022     3 DOS: the patient was seen and examined on 02/12/2022   Brief hospital course: 88 year old man PMH diastolic CHF presented with shortness of breath, recently treated for infection on doxycycline presented with leg swelling and new diagnosis of atrial fibrillation.  Admitted for acute on chronic CHF with massive volume overload, new diagnosis atrial fibrillation with RVR.  Repeat echo demonstrating global hypokinesis and systolic/diastolic HF with EF 80-99%. Seen by cardiology, overall responding well to diuresis.  Continue to be on A. Fib/A. Flutter; with improvements in his rate. May require TEE.   Assessment and Plan: * Acute on chronic combined systolic and diastolic CHF (congestive heart failure) ([place]) - some improvement appreciated in patient's HR when compare with admission; but continue to be on atrial fib/atrial flutter. -Repeat echo demonstrating combined systolic and diastolic heart failure, ejection fraction 30-35% and global hypokinesis.  Most likely arrhythmia mediated.   -Patient denies chest pain or palpitations currently.   -Continue metoprolol, Jardiance and continue diuresis; now orally. -volume stable and tolerating being flat on bed. -Further GMDT and rate control agents in the setting of acute exacerbation and soft blood pressure as per cardiology service..   -Cardiology planning for cardioversion later today (02/12/22)   -continue Daily weights, low-sodium diet strict I's and O's -Follow clinical response.   Paroxysmal atrial fibrillation with RVR ([place]) -New onset atrial fib/flutter with RVR with shortness of breath, and CHD. -CHADsVASC score 4 -continue metoprolol and eliquis. -will follow any further cardiology recommendations. -planning cardioversion later today.    CKD stage IIIa -with AKI component in the setting of decrease perfusion with acute CHF and  diuresis. -Baseline creatinine of 1.2-1.4 per chart review  -will follow up Cr closely while diuresing -patient reports good urine output -current Cr 1.61; continue close monitoring.  Demand ischemia (HCC) -Troponin flat.  -no CP -Cardiac enzymes elevation most likely in the setting of CHF exacerbation and arrhythmia. -cardiology on board and will follow rec's -continue telemetry monitoring -continue statin and metoprolol -aspirin stopped after initiation of anticoagulation.  Essential hypertension -soft but stable overall; patient reports no lightheadedness. -continue close monitoring of his vital signs. -receiving lopressor and lasix currently. -Heart healthy diet discussed with patient.    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 -No type B symptoms currently.  -S/p Rituxan weekly x 4 doses in 2018  -continue to follow up with oncology as an outpatient; per last follow up visit note there was No clinical or lab evidence of follicular lymphoma progression.   Subjective:  Afebrile, no chest pain, no nausea, no vomiting.  Following commands appropriately and comparative.  No overnight events.  Telemetry evaluation continue demonstrated atrial flutter/atrial fibrillation.  Heart rate 115-120 sustained.  Physical Exam: Vitals:   02/12/22 1420 02/12/22 1430 02/12/22 1436 02/12/22 1451  BP:  99/71 104/73 113/86  Pulse:  69    Resp:  (!) 22    Temp:    (!) 97.3 F (36.3 C)  TempSrc:    Oral  SpO2: 98% 97%    Weight:      Height:       General exam: Oriented x1 and following commands appropriately.  No overnight events; denies chest pain, shortness of breath or palpitations.  Doing well. Respiratory system: Good saturation on room air, no using accessory muscles.  Good  air movement bilaterally.  No wheezing or crackles. Cardiovascular system:  Irregular irregular, no rubs, no gallops, no JVD on exam. Gastrointestinal system: Abdomen is nondistended, soft and nontender. No organomegaly or masses felt. Normal bowel sounds heard. Central nervous system: Alert and oriented. No focal neurological deficits. Extremities: No cyanosis or clubbing. Skin: No petechiae. Psychiatry: Mood & affect appropriate.   Data Reviewed: 2D echo: Demonstrating reduced ejection fraction 30-35%; diffuse global hypokinesis. Basic metabolic panel: Sodium 252, potassium 3.7, chloride 91, bicarb 36, BUN 22, creatinine 1.61.  Planned cardioversion: later today 6/30  Family Communication: no family at bedside.  Disposition: Status is: Inpatient Remains inpatient appropriate because: still requiring IV diuresis, cardiac management optimization and very likely cardioversion.  Planned Discharge Destination: Home   Author: Barton Dubois, MD 02/12/2022 5:42 PM  For on call review [email or web address].

## 2022-02-12 NOTE — Progress Notes (Signed)
Mobility Specialist Progress Note    02/12/22 1707  Mobility  Activity Ambulated with assistance in hallway  Level of Assistance Contact guard assist, steadying assist  Assistive Device Front wheel walker  Distance Ambulated (ft) 350 ft  Activity Response Tolerated well  $Mobility charge 1 Mobility   Pre-Mobility: 85 HR During Mobility: 93 HR Post-Mobility: 81 HR  Pt received in bed and agreeable. Had x1 LOB requiring modA to recover. No complaints on walk. Returned to chair with call bell in reach and family present. RN notified.   Hildred Alamin Mobility Specialist

## 2022-02-12 NOTE — CV Procedure (Signed)
INDICATIONS: AFlutter  PROCEDURE:   Informed consent was obtained prior to the procedure. The risks, benefits and alternatives for the procedure were discussed and the patient comprehended these risks.  Risks include, but are not limited to, cough, sore throat, vomiting, nausea, somnolence, esophageal and stomach trauma or perforation, bleeding, low blood pressure, aspiration, pneumonia, infection, trauma to the teeth and death.    After a procedural time-out, the oropharynx was anesthetized with 20% benzocaine spray.   During this procedure the patient was administered propofol per anesthesia.  The patient's heart rate, blood pressure, and oxygen saturation were monitored continuously during the procedure. The period of conscious sedation was 20 minutes, of which I was present face-to-face 100% of this time.  The transesophageal probe was inserted in the esophagus and stomach without difficulty and multiple views were obtained.  The patient was kept under observation until the patient left the procedure room.  The patient left the procedure room in stable condition.   Agitated microbubble saline contrast was not administered.  COMPLICATIONS:    There were no immediate complications.  FINDINGS:   FORMAL ECHOCARDIOGRAM REPORT PENDING Limited echo images due to poor image quality No LA appendage thrombus  RECOMMENDATIONS:    Proceed to cardioversion.  Procedure: Electrical Cardioversion Indications:  Atrial Flutter  Procedure Details:  Consent: Risks of procedure as well as the alternatives and risks of each were explained to the (patient/caregiver).  Consent for procedure obtained.  Time Out: Verified patient identification, verified procedure, site/side was marked, verified correct patient position, special equipment/implants available, medications/allergies/relevent history reviewed, required imaging and test results available. PERFORMED.  Patient placed on cardiac monitor,  pulse oximetry, supplemental oxygen as necessary.  Sedation given:  propofol  per anesthesia Pacer pads placed anterior and posterior chest.  Cardioverted 1 time(s).  Cardioversion with synchronized biphasic 120J shock.  Evaluation: Findings: Post procedure EKG shows: NSR Complications: None Patient did tolerate procedure well.  Time Spent Directly with the Patient:  45 minutes   Elouise Munroe 02/12/2022, 5:17 PM

## 2022-02-12 NOTE — Progress Notes (Addendum)
Progress Note  Patient Name: Henry Nichols Date of Encounter: 02/12/2022  [facility] Cardiologist: Freada Bergeron, MD   Subjective   Patient doing well this AM. Denies chest pain, sob, palpitations. Patient is oriented to person, not place or time. Calm  Inpatient Medications    Scheduled Meds:  apixaban  5 mg Oral BID   atorvastatin  80 mg Oral Daily   empagliflozin  10 mg Oral Daily   melatonin  10 mg Oral QHS   metoprolol tartrate  12.5 mg Oral BID   multivitamin  1 tablet Oral Daily   sodium chloride flush  3 mL Intravenous Q12H   Continuous Infusions:  sodium chloride     PRN Meds: sodium chloride, acetaminophen **OR** acetaminophen, haloperidol lactate, sodium chloride flush   Vital Signs    Vitals:   02/11/22 1319 02/11/22 2024 02/11/22 2100 02/12/22 0500  BP: 116/69 (!) 84/63 104/69 105/70  Pulse: (!) 115 98 (!) 113 93  Resp: 18 (!) 24  (!) 25  Temp: (!) 97.5 F (36.4 C) 97.7 F (36.5 C)  98 F (36.7 C)  TempSrc: Oral Axillary  Axillary  SpO2: 97% 98%  100%  Weight:    62.2 kg  Height:        Intake/Output Summary (Last 24 hours) at 02/12/2022 0914 Last data filed at 02/12/2022 0700 Gross per 24 hour  Intake 240 ml  Output 1700 ml  Net -1460 ml      02/12/2022    5:00 AM 02/11/2022    4:18 AM 02/10/2022    3:28 AM  Last 3 Weights  Weight (lbs) 137 lb 2 oz 138 lb 7.2 oz 152 lb 4.8 oz  Weight (kg) 62.2 kg 62.8 kg 69.083 kg      Telemetry    Atrial flutter, HR 115  - Personally Reviewed  ECG    No new tracings - Personally Reviewed  Physical Exam   GEN: No acute distress. Resting comfortably in the bed   Neck: No JVD Cardiac: Regular rhythm, tachycardic. Faint systolic murmur  Respiratory: Clear to auscultation bilaterally. GI: Soft, nontender, non-distended  MS: Trace edema; No deformity. Right leg appears to have chronic skin changes  Neuro:  Nonfocal. Oriented to person, not place or time.  Psych: Normal affect   Labs     High Sensitivity Troponin:   Recent Labs  Lab 02/08/22 1407 02/08/22 1553  TROPONINIHS 31* 30*     Chemistry Recent Labs  Lab 02/08/22 1407 02/08/22 1553 02/09/22 0036 02/10/22 0223 02/11/22 0427 02/12/22 0025  NA 139  --    < > 139 142 138  K 4.0  --    < > 3.5 3.2* 3.7  CL 104  --    < > 95* 91* 91*  CO2 28  --    < > 33* 37* 36*  GLUCOSE 115*  --    < > 110* 88 117*  BUN 13  --    < > 25* 20 22  CREATININE 1.44*  --    < > 1.55* 1.36* 1.61*  CALCIUM 9.1  --    < > 9.1 9.3 8.9  MG  --  2.1  --   --   --   --   PROT 6.5  --   --   --   --   --   ALBUMIN 3.1*  --   --   --   --   --   AST 31  --   --   --   --   --  ALT 22  --   --   --   --   --   ALKPHOS 74  --   --   --   --   --   BILITOT 1.6*  --   --   --   --   --   GFRNONAA 47*  --    < > 43* 50* 41*  ANIONGAP 7  --    < > '11 14 11   '$ < > = values in this interval not displayed.    Lipids  Recent Labs  Lab 02/09/22 0036  CHOL 169  TRIG 67  HDL 51  LDLCALC 105*  CHOLHDL 3.3    Hematology Recent Labs  Lab 02/08/22 1407 02/09/22 0036  WBC 5.1 4.7  RBC 3.75* 3.80*  HGB 12.1* 11.9*  HCT 37.7* 37.6*  MCV 100.5* 98.9  MCH 32.3 31.3  MCHC 32.1 31.6  RDW 15.3 15.4  PLT 282 274   Thyroid  Recent Labs  Lab 02/09/22 0036  TSH 3.648    BNP Recent Labs  Lab 02/08/22 1407 02/11/22 0427  BNP 598.9* 289.9*    DDimer No results for input(s): "DDIMER" in the last 168 hours.   Radiology    DG Chest 2 View  Result Date: 02/11/2022 CLINICAL DATA:  Shortness of breath EXAM: CHEST - 2 VIEW COMPARISON:  Previous studies including the examination of 02/09/2019 FINDINGS: Transverse diameter of heart is increased. Central pulmonary vessels are prominent. There are no signs of alveolar pulmonary edema. Small to moderate bilateral pleural effusions are seen, more so on the right side. Evaluation of lower lung fields for infiltrates is limited by the effusions. There are no signs of alveolar pulmonary  edema. There is no pneumothorax. IMPRESSION: Cardiomegaly. Bilateral pleural effusions, more so on the right side. Possibility of underlying atelectasis/pneumonia in the lower lung fields is not excluded. Electronically Signed   By: Elmer Picker M.D.   On: 02/11/2022 08:28    Cardiac Studies   Echocardiogram 02/09/22   1. There is severe LBBB-related systolic dyssynchrony. Left ventricular  ejection fraction, by estimation, is 30 to 35%. The left ventricle has  moderately decreased function. The left ventricle demonstrates global  hypokinesis. There is mild concentric  left ventricular hypertrophy. Left ventricular diastolic function could  not be evaluated.   2. Right ventricular systolic function is moderately reduced. The right  ventricular size is normal. There is mildly elevated pulmonary artery  systolic pressure. The estimated right ventricular systolic pressure is  43.1 mmHg.   3. Left atrial size was mild to moderately dilated.   4. Right atrial size was moderately dilated.   5. The mitral valve is normal in structure. Mild mitral valve  regurgitation. No evidence of mitral stenosis.   6. Tricuspid valve regurgitation is moderate.   7. The aortic valve is tricuspid. Aortic valve regurgitation is mild. No  aortic stenosis is present.   8. There is mild dilatation of the ascending aorta, measuring 38 mm.   9. The inferior vena cava is dilated in size with <50% respiratory  variability, suggesting right atrial pressure of 15 mmHg.   Patient Profile     88 y.o. male with a hx of HLD, HTN, LBBB, mild COPD, non-hodgkin's lymphoma and GERD who presented with worsening dyspnea on exertion and LE edema found to have new Aflutter with RVR and new heart failure for which Cardiology was consulted.  Assessment & Plan    Atrial Flutter with RVR: New  onset, patient has been unaware of his rhythm.  Denies any history of the same.  -- TSH within normal limits  -- Was started on  metoprolol 12.5 mg BID this admission. HR continues to be elevated consistently to 110-115. BP soft so there is little room to titrate further -- Patient remains in a flutter after significant diuresis  -- As volume status has improved significantly, patient is set to undergo TEE guided cardioversion today. He has been NPO since midnight. Patient is confused, so I discussed procedure, risk, and benefits with Patient's son Henry Nichols who consented to the procedure  -- Continue eliquis 5 mg BID (new this admission)  -- Echocardiogram showed LVEF 30-35%, global hypokinesis, LBBB-related systolic dyssynchrony, moderately reduced RV function, mildly elevated pulmonary artery systolic pressure, moderately dilated RA and LA    Acute on Chronic HFrEF: History of HFpEF, presented significantly volume overloaded.  BNP 589 and chest x-ray with bilateral effusions on presentation. -- Echo this admission showed newly reduced EF 30-35%, moderately reduced RV function  -- Suspect that drop in EF is related to afib with RVR. Plan TEE/DCCV today -- Has been on IV lasix '40mg'$  BID since admission. Output 1.7 L urine yesterday and is currently net -16L since admission. BNP down from 598>>289  -- IV lasix was discontinued yesterday afternoon-- creatinine bumped from 1.36 up to 1.61 this AM  -- Hold off on oral lasix for now, patient euvolemic. Suspect we can start oral lasix tomorrow  -- Will need an echocardiogram 6 weeks after restoration of NSR-- if patient continues to have a low EF, will need ischemic evaluation at that time  -- Continue metoprolol 12.'5mg'$  BID -- Now on jardiance 10 mg daily -- BP is stable but somewhat low. May not tolerate ACEi, ARB, ARNI, spiro at this time    Hypertension:  -- Now on metoprolol 12.5 mg twice daily. BP is tolerating, but there is little room to titrate further   COPD: No wheezing on exam.   Hyperlipidemia:  -- Patient was taking Simvastatin 40 mg PTA -- LDL 105, HDL 51,  triglycerides 67 this admission  -- Transitioned to lipitor 80 mg daily due to LDL of 105. Will need LFTs and lipid panel in 2-3 months      Shared Decision Making/Informed Consent The risks [stroke, cardiac arrhythmias rarely resulting in the need for a temporary or permanent pacemaker, skin irritation or burns, esophageal damage, perforation (1:10,000 risk), bleeding, pharyngeal hematoma as well as other potential complications associated with conscious sedation including aspiration, arrhythmia, respiratory failure and death], benefits (treatment guidance, restoration of normal sinus rhythm, diagnostic support) and alternatives of a transesophageal echocardiogram guided cardioversion were discussed in detail. Patient is confused at baseline and unable to consent at this time. I discussed the procedure thoroughly with his Son, Henry Nichols, who consented   For questions or updates, please contact [place] Please consult [email or web address] for contact info under        Signed, Margie Billet, PA-C  02/12/2022, 9:14 AM    Patient seen and examined and agree with Vikki Ports, PA-C as detailed above.   In brief, the patient is a 88 y.o. male with a hx of HLD, HTN, LBBB, mild COPD, non-hodgkin's lymphoma and GERD who presented with worsening dyspnea on exertion and LE edema found to have new Aflutter with RVR and new heart failure for which Cardiology was consulted.   Patient followed remotely by Cardiology. TTE in 08/2017 showed LVEF of 60- 65%,  no wall motion abnormality, LVH, grade 1 diastolic dysfunction, mildly dilated right atrium, aortic sclerosis with mild AI.    He presented on this admission with worsening SOB and LE edema found to be in new Aflutter with RVR with evidence of volume overload on exam. BNP 500s.   TTE here with LVEF 30-35%, global hypokinesis, moderately reduced RV systolic function, mild MR, moderate TR, mild AR, mild aortic dilation, RAP 15. Suspect acute drop  in EF related to Aflutter with RVR. Has been responding well to IV diuresis. Net negative 16L since admission. HR okay in 110s. Wt 165 on admission to 137lbs today.   Currently, he is significantly improved and now euvolemic on exam. Will hold lasix today given rise in Cr and plan to transition to PO tomorrow. He is scheduled for TEE/DCCV today.   GEN: Elderly male, comfortable, alert and awake Neck: No JVD Cardiac: Tachycardic, regular, 2/6 systolic murmur Respiratory: CTAB GI: Soft, nontender, non-distended  MS: Trace pitting edema, warm Neuro:  AAOx3, hard of hearing Psych: Normal   Plan: -Plan for TEE/DCCV today -Hold lasix today and transition to PO tomorrow (either 20 or '40mg'$  daily) -Continue metop 12.'5mg'$  BID with holding parameters given soft blood pressure -Continue jardiance '10mg'$  daily -Hold spiro for now given soft blood pressures; can add back as able tomorrow -Continue apixaban '5mg'$  BID for AC -Add ACE/ARB/ARNI as able pending BP and renal function (can do as outpatient) -If fails to improve EF despite restoration of NSR, will plan for ischemic work-up at that time    Gwyndolyn Kaufman, MD

## 2022-02-12 NOTE — Progress Notes (Signed)
Heart Failure Stewardship Pharmacist Progress Note   PCP: [name], MD PCP-Cardiologist: Freada Bergeron, MD    HPI:  88 yo M with PMH of HTN, HLD, LBBB, mild COPD, non-hodgkin's lymphoma, and GERD.  He presented to the ED on 6/26 with shortness of breath, BLE edema, and orthopnea. He went to see his PCP and was found to be in afib RVR and directed him to the ED for evaluation. Last ECHO in 08/2017 with LVEF 60-65%. CXR on admission with new small bilateral pleural effusions and mild interstitial edema. ECHO on 6/27 with LVEF reduced to 30-35%, mild LVH, RV moderately reduced, mildly elevated pulmonary artery pressures, mild MR and mild AR.   Pending TEE/DCCV today.  Current HF Medications: Beta Blocker: metoprolol tartrate 12.5 mg BID SGLT2i: Jardiance 10 mg daily  Prior to admission HF Medications: None  Pertinent Lab Values: Serum creatinine 1.61, BUN 22, Potassium 3.7, Sodium 138, BNP 598.9>289.9, Magnesium 2.1   Vital Signs: Weight: 137 lbs (admission weight: 161 lbs) Blood pressure: 100/60s  Heart rate: 110s - afib  Output (not documenting input): -1L yesterday; since admission -16.3L  Medication Assistance / Insurance Benefits Check: Does the patient have prescription insurance?  Yes Type of insurance plan: AARP Medicare  Does the patient qualify for medication assistance through manufacturers or grants?   Yes Eligible grants and/or patient assistance programs: Jardiance Medication assistance applications in progress: Jardiance Medication assistance applications approved: none Approved medication assistance renewals will be completed by: [place]:  Prior to admission outpatient pharmacy: [facility] Is the patient willing to use [place] at discharge? Yes Is the patient willing to transition their outpatient pharmacy to utilize a [facility] outpatient pharmacy?   Pending    Assessment: 1. Acute on chronic systolic CHF (LVEF  78-24%), due to presumed afib RVR. Pending TEE/DCCV hopefully today. NYHA class II symptoms. - Excellent diuresis. Weight down and good urine output. Off IV lasix 6/29. Keep K>4 and Mag>2. - Continue metoprolol tartrate 12.5 mg BID. Switch to metoprolol XL prior to discharge once dose determined.  - No Entresto yet with AKI - Holding spironolactone with soft BP and now AKI - Continue Jardiance 10 mg daily   Plan: 1) Medication changes recommended at this time: - None pending improvement in renal function  2) Patient assistance: Delene Loll copay $45 -Jardiance $45 -Patient assistance started for Jardiance - patient portion completed and signed -Eliquis patient assistance would require 3% of annual income to be spent at the pharmacy first. Patient is unable to report how much he thinks he has spent. He states he will look into this after discharge. Information provided.   3)  Education  - Patient has been educated on current HF medications and potential additions to HF medication regimen - Patient verbalizes understanding that over the next few months, these medication doses may change and more medications may be added to optimize HF regimen - Patient has been educated on basic disease state pathophysiology and goals of therapy   Kerby Nora, PharmD, BCPS Heart Failure Stewardship Pharmacist Phone [phone]

## 2022-02-12 NOTE — Interval H&P Note (Signed)
History and Physical Interval Note:  02/12/2022 1:47 PM  Henry Nichols  has presented today for surgery, with the diagnosis of AFIB.  The various methods of treatment have been discussed with the patient and family. After consideration of risks, benefits and other options for treatment, the patient has consented to  Procedure(s): TRANSESOPHAGEAL ECHOCARDIOGRAM (TEE) (N/A) CARDIOVERSION (N/A) as a surgical intervention.  The patient's history has been reviewed, patient examined, no change in status, stable for surgery.  I have reviewed the patient's chart and labs.  Questions were answered to the patient's satisfaction.     Elouise Munroe

## 2022-02-12 NOTE — Care Management Important Message (Signed)
Important Message  Patient Details  Name: Henry Nichols MRN: [id number] Date of Birth: 05/09/1932   Medicare Important Message Given:  Yes     Shelda Altes 02/12/2022, 9:30 AM

## 2022-02-12 NOTE — Transfer of Care (Signed)
Immediate Anesthesia Transfer of Care Note  Patient: Henry Nichols  Procedure(s) Performed: TRANSESOPHAGEAL ECHOCARDIOGRAM (TEE) CARDIOVERSION  Patient Location: Short Stay  Anesthesia Type:MAC  Level of Consciousness: drowsy  Airway & Oxygen Therapy: Patient connected to nasal cannula oxygen  Post-op Assessment: Report given to RN and Post -op Vital signs reviewed and stable  Post vital signs: Reviewed and stable  Last Vitals:  Vitals Value Taken Time  BP 108/67   Temp    Pulse 66 02/12/22 1419  Resp 23 02/12/22 1419  SpO2 97 % 02/12/22 1419  Vitals shown include unvalidated device data.  Last Pain:  Vitals:   02/12/22 1245  TempSrc: Oral  PainSc: 0-No pain      Patients Stated Pain Goal: 0 (43/32/95 1884)  Complications: No notable events documented.

## 2022-02-12 NOTE — H&P (View-Only) (Signed)
Progress Note  Patient Name: Henry Nichols Date of Encounter: 02/12/2022  [facility] Cardiologist: Freada Bergeron, MD   Subjective   Patient doing well this AM. Denies chest pain, sob, palpitations. Patient is oriented to person, not place or time. Calm  Inpatient Medications    Scheduled Meds:  apixaban  5 mg Oral BID   atorvastatin  80 mg Oral Daily   empagliflozin  10 mg Oral Daily   melatonin  10 mg Oral QHS   metoprolol tartrate  12.5 mg Oral BID   multivitamin  1 tablet Oral Daily   sodium chloride flush  3 mL Intravenous Q12H   Continuous Infusions:  sodium chloride     PRN Meds: sodium chloride, acetaminophen **OR** acetaminophen, haloperidol lactate, sodium chloride flush   Vital Signs    Vitals:   02/11/22 1319 02/11/22 2024 02/11/22 2100 02/12/22 0500  BP: 116/69 (!) 84/63 104/69 105/70  Pulse: (!) 115 98 (!) 113 93  Resp: 18 (!) 24  (!) 25  Temp: (!) 97.5 F (36.4 C) 97.7 F (36.5 C)  98 F (36.7 C)  TempSrc: Oral Axillary  Axillary  SpO2: 97% 98%  100%  Weight:    62.2 kg  Height:        Intake/Output Summary (Last 24 hours) at 02/12/2022 0914 Last data filed at 02/12/2022 0700 Gross per 24 hour  Intake 240 ml  Output 1700 ml  Net -1460 ml      02/12/2022    5:00 AM 02/11/2022    4:18 AM 02/10/2022    3:28 AM  Last 3 Weights  Weight (lbs) 137 lb 2 oz 138 lb 7.2 oz 152 lb 4.8 oz  Weight (kg) 62.2 kg 62.8 kg 69.083 kg      Telemetry    Atrial flutter, HR 115  - Personally Reviewed  ECG    No new tracings - Personally Reviewed  Physical Exam   GEN: No acute distress. Resting comfortably in the bed   Neck: No JVD Cardiac: Regular rhythm, tachycardic. Faint systolic murmur  Respiratory: Clear to auscultation bilaterally. GI: Soft, nontender, non-distended  MS: Trace edema; No deformity. Right leg appears to have chronic skin changes  Neuro:  Nonfocal. Oriented to person, not place or time.  Psych: Normal affect   Labs     High Sensitivity Troponin:   Recent Labs  Lab 02/08/22 1407 02/08/22 1553  TROPONINIHS 31* 30*     Chemistry Recent Labs  Lab 02/08/22 1407 02/08/22 1553 02/09/22 0036 02/10/22 0223 02/11/22 0427 02/12/22 0025  NA 139  --    < > 139 142 138  K 4.0  --    < > 3.5 3.2* 3.7  CL 104  --    < > 95* 91* 91*  CO2 28  --    < > 33* 37* 36*  GLUCOSE 115*  --    < > 110* 88 117*  BUN 13  --    < > 25* 20 22  CREATININE 1.44*  --    < > 1.55* 1.36* 1.61*  CALCIUM 9.1  --    < > 9.1 9.3 8.9  MG  --  2.1  --   --   --   --   PROT 6.5  --   --   --   --   --   ALBUMIN 3.1*  --   --   --   --   --   AST 31  --   --   --   --   --  ALT 22  --   --   --   --   --   ALKPHOS 74  --   --   --   --   --   BILITOT 1.6*  --   --   --   --   --   GFRNONAA 47*  --    < > 43* 50* 41*  ANIONGAP 7  --    < > '11 14 11   '$ < > = values in this interval not displayed.    Lipids  Recent Labs  Lab 02/09/22 0036  CHOL 169  TRIG 67  HDL 51  LDLCALC 105*  CHOLHDL 3.3    Hematology Recent Labs  Lab 02/08/22 1407 02/09/22 0036  WBC 5.1 4.7  RBC 3.75* 3.80*  HGB 12.1* 11.9*  HCT 37.7* 37.6*  MCV 100.5* 98.9  MCH 32.3 31.3  MCHC 32.1 31.6  RDW 15.3 15.4  PLT 282 274   Thyroid  Recent Labs  Lab 02/09/22 0036  TSH 3.648    BNP Recent Labs  Lab 02/08/22 1407 02/11/22 0427  BNP 598.9* 289.9*    DDimer No results for input(s): "DDIMER" in the last 168 hours.   Radiology    DG Chest 2 View  Result Date: 02/11/2022 CLINICAL DATA:  Shortness of breath EXAM: CHEST - 2 VIEW COMPARISON:  Previous studies including the examination of 02/09/2019 FINDINGS: Transverse diameter of heart is increased. Central pulmonary vessels are prominent. There are no signs of alveolar pulmonary edema. Small to moderate bilateral pleural effusions are seen, more so on the right side. Evaluation of lower lung fields for infiltrates is limited by the effusions. There are no signs of alveolar pulmonary  edema. There is no pneumothorax. IMPRESSION: Cardiomegaly. Bilateral pleural effusions, more so on the right side. Possibility of underlying atelectasis/pneumonia in the lower lung fields is not excluded. Electronically Signed   By: Elmer Picker M.D.   On: 02/11/2022 08:28    Cardiac Studies   Echocardiogram 02/09/22   1. There is severe LBBB-related systolic dyssynchrony. Left ventricular  ejection fraction, by estimation, is 30 to 35%. The left ventricle has  moderately decreased function. The left ventricle demonstrates global  hypokinesis. There is mild concentric  left ventricular hypertrophy. Left ventricular diastolic function could  not be evaluated.   2. Right ventricular systolic function is moderately reduced. The right  ventricular size is normal. There is mildly elevated pulmonary artery  systolic pressure. The estimated right ventricular systolic pressure is  24.2 mmHg.   3. Left atrial size was mild to moderately dilated.   4. Right atrial size was moderately dilated.   5. The mitral valve is normal in structure. Mild mitral valve  regurgitation. No evidence of mitral stenosis.   6. Tricuspid valve regurgitation is moderate.   7. The aortic valve is tricuspid. Aortic valve regurgitation is mild. No  aortic stenosis is present.   8. There is mild dilatation of the ascending aorta, measuring 38 mm.   9. The inferior vena cava is dilated in size with <50% respiratory  variability, suggesting right atrial pressure of 15 mmHg.   Patient Profile     88 y.o. male with a hx of HLD, HTN, LBBB, mild COPD, non-hodgkin's lymphoma and GERD who presented with worsening dyspnea on exertion and LE edema found to have new Aflutter with RVR and new heart failure for which Cardiology was consulted.  Assessment & Plan    Atrial Flutter with RVR: New  onset, patient has been unaware of his rhythm.  Denies any history of the same.  -- TSH within normal limits  -- Was started on  metoprolol 12.5 mg BID this admission. HR continues to be elevated consistently to 110-115. BP soft so there is little room to titrate further -- Patient remains in a flutter after significant diuresis  -- As volume status has improved significantly, patient is set to undergo TEE guided cardioversion today. He has been NPO since midnight. Patient is confused, so I discussed procedure, risk, and benefits with Patient's son Brenen Beigel who consented to the procedure  -- Continue eliquis 5 mg BID (new this admission)  -- Echocardiogram showed LVEF 30-35%, global hypokinesis, LBBB-related systolic dyssynchrony, moderately reduced RV function, mildly elevated pulmonary artery systolic pressure, moderately dilated RA and LA    Acute on Chronic HFrEF: History of HFpEF, presented significantly volume overloaded.  BNP 589 and chest x-ray with bilateral effusions on presentation. -- Echo this admission showed newly reduced EF 30-35%, moderately reduced RV function  -- Suspect that drop in EF is related to afib with RVR. Plan TEE/DCCV today -- Has been on IV lasix '40mg'$  BID since admission. Output 1.7 L urine yesterday and is currently net -16L since admission. BNP down from 598>>289  -- IV lasix was discontinued yesterday afternoon-- creatinine bumped from 1.36 up to 1.61 this AM  -- Hold off on oral lasix for now, patient euvolemic. Suspect we can start oral lasix tomorrow  -- Will need an echocardiogram 6 weeks after restoration of NSR-- if patient continues to have a low EF, will need ischemic evaluation at that time  -- Continue metoprolol 12.'5mg'$  BID -- Now on jardiance 10 mg daily -- BP is stable but somewhat low. May not tolerate ACEi, ARB, ARNI, spiro at this time    Hypertension:  -- Now on metoprolol 12.5 mg twice daily. BP is tolerating, but there is little room to titrate further   COPD: No wheezing on exam.   Hyperlipidemia:  -- Patient was taking Simvastatin 40 mg PTA -- LDL 105, HDL 51,  triglycerides 67 this admission  -- Transitioned to lipitor 80 mg daily due to LDL of 105. Will need LFTs and lipid panel in 2-3 months      Shared Decision Making/Informed Consent The risks [stroke, cardiac arrhythmias rarely resulting in the need for a temporary or permanent pacemaker, skin irritation or burns, esophageal damage, perforation (1:10,000 risk), bleeding, pharyngeal hematoma as well as other potential complications associated with conscious sedation including aspiration, arrhythmia, respiratory failure and death], benefits (treatment guidance, restoration of normal sinus rhythm, diagnostic support) and alternatives of a transesophageal echocardiogram guided cardioversion were discussed in detail. Patient is confused at baseline and unable to consent at this time. I discussed the procedure thoroughly with his Son, Avrey Hyser, who consented   For questions or updates, please contact [place] Please consult [email or web address] for contact info under        Signed, Margie Billet, PA-C  02/12/2022, 9:14 AM    Patient seen and examined and agree with Vikki Ports, PA-C as detailed above.   In brief, the patient is a 88 y.o. male with a hx of HLD, HTN, LBBB, mild COPD, non-hodgkin's lymphoma and GERD who presented with worsening dyspnea on exertion and LE edema found to have new Aflutter with RVR and new heart failure for which Cardiology was consulted.   Patient followed remotely by Cardiology. TTE in 08/2017 showed LVEF of 60- 65%,  no wall motion abnormality, LVH, grade 1 diastolic dysfunction, mildly dilated right atrium, aortic sclerosis with mild AI.    He presented on this admission with worsening SOB and LE edema found to be in new Aflutter with RVR with evidence of volume overload on exam. BNP 500s.   TTE here with LVEF 30-35%, global hypokinesis, moderately reduced RV systolic function, mild MR, moderate TR, mild AR, mild aortic dilation, RAP 15. Suspect acute drop  in EF related to Aflutter with RVR. Has been responding well to IV diuresis. Net negative 16L since admission. HR okay in 110s. Wt 165 on admission to 137lbs today.   Currently, he is significantly improved and now euvolemic on exam. Will hold lasix today given rise in Cr and plan to transition to PO tomorrow. He is scheduled for TEE/DCCV today.   GEN: Elderly male, comfortable, alert and awake Neck: No JVD Cardiac: Tachycardic, regular, 2/6 systolic murmur Respiratory: CTAB GI: Soft, nontender, non-distended  MS: Trace pitting edema, warm Neuro:  AAOx3, hard of hearing Psych: Normal   Plan: -Plan for TEE/DCCV today -Hold lasix today and transition to PO tomorrow (either 20 or '40mg'$  daily) -Continue metop 12.'5mg'$  BID with holding parameters given soft blood pressure -Continue jardiance '10mg'$  daily -Hold spiro for now given soft blood pressures; can add back as able tomorrow -Continue apixaban '5mg'$  BID for AC -Add ACE/ARB/ARNI as able pending BP and renal function (can do as outpatient) -If fails to improve EF despite restoration of NSR, will plan for ischemic work-up at that time    Gwyndolyn Kaufman, MD

## 2022-02-12 NOTE — Anesthesia Procedure Notes (Signed)
Procedure Name: General with mask airway Date/Time: 02/12/2022 1:54 PM  Performed by: Erick Colace, CRNAPre-anesthesia Checklist: Patient identified, Emergency Drugs available, Suction available and Patient being monitored Patient Re-evaluated:Patient Re-evaluated prior to induction Oxygen Delivery Method: Nasal cannula Preoxygenation: Pre-oxygenation with 100% oxygen Induction Type: IV induction Airway Equipment and Method: Bite block Dental Injury: Teeth and Oropharynx as per pre-operative assessment

## 2022-02-12 NOTE — Anesthesia Preprocedure Evaluation (Addendum)
Anesthesia Evaluation  Patient identified by MRN, date of birth, ID band Patient awake    Reviewed: Allergy & Precautions, NPO status , Patient's Chart, lab work & pertinent test results, reviewed documented beta blocker date and time   History of Anesthesia Complications Negative for: history of anesthetic complications  Airway Mallampati: II  TM Distance: >3 FB Neck ROM: Full    Dental no notable dental hx.    Pulmonary former smoker,    Pulmonary exam normal        Cardiovascular hypertension, Pt. on medications and Pt. on home beta blockers + CAD and +CHF  Normal cardiovascular exam+ dysrhythmias (LBBB) Atrial Fibrillation   TTE 02/09/22: severe LBBB-related systolic dyssynchrony, EF 30-35%, global hypokinesis, mild LVH,RV systolic function moderately reduced, mildly elevated PASP 46.8 mmHg, mild to moderate LAE, moderate RAE, mild MR, moderate TR, mild AR, mild dilatation of ascending aorta measuring 38 mm    Neuro/Psych negative neurological ROS  negative psych ROS   GI/Hepatic Neg liver ROS, GERD  Medicated and Controlled,  Endo/Other  negative endocrine ROS  Renal/GU Renal InsufficiencyRenal disease (Cr 1.61)  negative genitourinary   Musculoskeletal negative musculoskeletal ROS (+)   Abdominal   Peds  Hematology  (+) Blood dyscrasia (Hgb 11.9), anemia , Non-Hodgkin lymphoma   Anesthesia Other Findings Day of surgery medications reviewed with patient.  Reproductive/Obstetrics negative OB ROS                           Anesthesia Physical Anesthesia Plan  ASA: 4  Anesthesia Plan: MAC   Post-op Pain Management: Minimal or no pain anticipated   Induction:   PONV Risk Score and Plan: Treatment may vary due to age or medical condition and Propofol infusion  Airway Management Planned: Natural Airway and Nasal Cannula  Additional Equipment: None  Intra-op Plan:   Post-operative  Plan:   Informed Consent: I have reviewed the patients History and Physical, chart, labs and discussed the procedure including the risks, benefits and alternatives for the proposed anesthesia with the patient or authorized representative who has indicated his/her understanding and acceptance.       Plan Discussed with: CRNA  Anesthesia Plan Comments:        Anesthesia Quick Evaluation

## 2022-02-12 NOTE — Anesthesia Postprocedure Evaluation (Signed)
Anesthesia Post Note  Patient: Henry Nichols  Procedure(s) Performed: TRANSESOPHAGEAL ECHOCARDIOGRAM (TEE) CARDIOVERSION     Patient location during evaluation: PACU Anesthesia Type: MAC Level of consciousness: awake and alert Pain management: pain level controlled Vital Signs Assessment: post-procedure vital signs reviewed and stable Respiratory status: spontaneous breathing Cardiovascular status: stable Anesthetic complications: no   No notable events documented.  Last Vitals:  Vitals:   02/12/22 1451 02/12/22 2010  BP: 113/86 127/65  Pulse:  82  Resp:  14  Temp: (!) 36.3 C 36.6 C  SpO2:  95%    Last Pain:  Vitals:   02/12/22 2010  TempSrc: Oral  PainSc: 0-No pain                 Nolon Nations

## 2022-02-12 NOTE — Progress Notes (Signed)
  Echocardiogram Echocardiogram Transesophageal has been performed.  Darlina Sicilian M 02/12/2022, 2:27 PM

## 2022-02-13 DIAGNOSIS — N179 Acute kidney failure, unspecified: Secondary | ICD-10-CM | POA: Diagnosis not present

## 2022-02-13 DIAGNOSIS — I480 Paroxysmal atrial fibrillation: Secondary | ICD-10-CM | POA: Diagnosis not present

## 2022-02-13 DIAGNOSIS — I5043 Acute on chronic combined systolic (congestive) and diastolic (congestive) heart failure: Secondary | ICD-10-CM | POA: Diagnosis not present

## 2022-02-13 DIAGNOSIS — N189 Chronic kidney disease, unspecified: Secondary | ICD-10-CM

## 2022-02-13 DIAGNOSIS — I10 Essential (primary) hypertension: Secondary | ICD-10-CM | POA: Diagnosis not present

## 2022-02-13 LAB — BASIC METABOLIC PANEL
Anion gap: 13 (ref 5–15)
BUN: 18 mg/dL (ref 8–23)
CO2: 32 mmol/L (ref 22–32)
Calcium: 8.9 mg/dL (ref 8.9–10.3)
Chloride: 94 mmol/L — ABNORMAL LOW (ref 98–111)
Creatinine, Ser: 1.48 mg/dL — ABNORMAL HIGH (ref 0.61–1.24)
GFR, Estimated: 46 mL/min — ABNORMAL LOW (ref >60)
Glucose, Bld: 94 mg/dL (ref 70–99)
Potassium: 4.1 mmol/L (ref 3.5–5.1)
Sodium: 139 mmol/L (ref 135–145)

## 2022-02-13 LAB — MAGNESIUM: Magnesium: 1.9 mg/dL (ref 1.7–2.4)

## 2022-02-13 MED ORDER — FUROSEMIDE 20 MG PO TABS
20.0000 mg | ORAL_TABLET | Freq: Every day | ORAL | Status: DC
Start: 2022-02-14 — End: 2022-02-14
  Administered 2022-02-14: 20 mg via ORAL
  Filled 2022-02-13: qty 1

## 2022-02-13 NOTE — Progress Notes (Signed)
Progress Note  Patient Name: Henry Nichols Date of Encounter: 02/13/2022  [facility] Cardiologist: Freada Bergeron, MD   Subjective   Some confusion   Inpatient Medications    Scheduled Meds:  apixaban  5 mg Oral BID   atorvastatin  80 mg Oral Daily   empagliflozin  10 mg Oral Daily   melatonin  10 mg Oral QHS   metoprolol tartrate  12.5 mg Oral BID   multivitamin  1 tablet Oral Daily   sodium chloride flush  3 mL Intravenous Q12H   Continuous Infusions:  sodium chloride     PRN Meds: sodium chloride, acetaminophen **OR** acetaminophen, haloperidol lactate, sodium chloride flush   Vital Signs    Vitals:   02/12/22 2010 02/13/22 0032 02/13/22 0442 02/13/22 0729  BP: 127/65 117/84 117/78 100/69  Pulse: 82 74 85 80  Resp: '14 17 15 16  '$ Temp: 97.8 F (36.6 C) 98.2 F (36.8 C) 98.3 F (36.8 C) 98.1 F (36.7 C)  TempSrc: Oral Oral Oral Oral  SpO2: 95% 97% 95% 93%  Weight:   61.9 kg   Height:        Intake/Output Summary (Last 24 hours) at 02/13/2022 0913 Last data filed at 02/13/2022 0736 Gross per 24 hour  Intake 240 ml  Output 1400 ml  Net -1160 ml      02/13/2022    4:42 AM 02/12/2022   12:45 PM 02/12/2022    5:00 AM  Last 3 Weights  Weight (lbs) 136 lb 7.4 oz 137 lb 2 oz 137 lb 2 oz  Weight (kg) 61.9 kg 62.2 kg 62.2 kg      Telemetry    SR PACls   ECG    No new tracings - Personally Reviewed  Physical Exam   Frail elderly male Lungs clear SEM Abdomen benign Venous stasis changes RLE   Labs    High Sensitivity Troponin:   Recent Labs  Lab 02/08/22 1407 02/08/22 1553  TROPONINIHS 31* 30*     Chemistry Recent Labs  Lab 02/08/22 1407 02/08/22 1553 02/09/22 0036 02/11/22 0427 02/12/22 0025 02/13/22 0018  NA 139  --    < > 142 138 139  K 4.0  --    < > 3.2* 3.7 4.1  CL 104  --    < > 91* 91* 94*  CO2 28  --    < > 37* 36* 32  GLUCOSE 115*  --    < > 88 117* 94  BUN 13  --    < > '20 22 18  '$ CREATININE 1.44*  --    < >  1.36* 1.61* 1.48*  CALCIUM 9.1  --    < > 9.3 8.9 8.9  MG  --  2.1  --   --   --  1.9  PROT 6.5  --   --   --   --   --   ALBUMIN 3.1*  --   --   --   --   --   AST 31  --   --   --   --   --   ALT 22  --   --   --   --   --   ALKPHOS 74  --   --   --   --   --   BILITOT 1.6*  --   --   --   --   --   GFRNONAA 47*  --    < >  50* 41* 46*  ANIONGAP 7  --    < > '14 11 13   '$ < > = values in this interval not displayed.    Lipids  Recent Labs  Lab 02/09/22 0036  CHOL 169  TRIG 67  HDL 51  LDLCALC 105*  CHOLHDL 3.3    Hematology Recent Labs  Lab 02/08/22 1407 02/09/22 0036  WBC 5.1 4.7  RBC 3.75* 3.80*  HGB 12.1* 11.9*  HCT 37.7* 37.6*  MCV 100.5* 98.9  MCH 32.3 31.3  MCHC 32.1 31.6  RDW 15.3 15.4  PLT 282 274   Thyroid  Recent Labs  Lab 02/09/22 0036  TSH 3.648    BNP Recent Labs  Lab 02/08/22 1407 02/11/22 0427  BNP 598.9* 289.9*    DDimer No results for input(s): "DDIMER" in the last 168 hours.   Radiology    ECHO TEE  Result Date: 02/12/2022    TRANSESOPHOGEAL ECHO REPORT   Patient Name:   [name] Date of Exam: 02/12/2022 Medical Rec #:  [id number]        Height:       70.0 in Accession #:    [id number]       Weight:       137.1 lb Date of Birth:  October 07, 1931       BSA:          1.778 m Patient Age:    53 years         BP:           116/84 mmHg Patient Gender: M                HR:           111 bpm. Exam Location:  Inpatient Procedure: Transesophageal Echo, Cardiac Doppler and Color Doppler Indications:     Afib  History:         Patient has prior history of Echocardiogram examinations, most                  recent 02/09/2022. CAD, Arrythmias:LBBB; Risk                  Factors:Hypertension and Dyslipidemia.  Sonographer:     Darlina Sicilian RDCS Referring Phys:  [id number] Margie Billet Diagnosing Phys: Cherlynn Kaiser MD  Sonographer Comments: Limited TEE due to limited imaging windows. Unable to obtian deep gastric imgaing. PROCEDURE: After discussion  of the risks and benefits of a TEE, an informed consent was obtained from the patient. TEE procedure time was 6 minutes. The transesophogeal probe was passed without difficulty through the esophogus of the patient. Imaged were  obtained with the patient in a left lateral decubitus position. Local oropharyngeal anesthetic was provided with Cetacaine. Sedation performed by different physician. The patient was monitored while under deep sedation. Anesthestetic sedation was provided intravenously by Anesthesiology: 105.'88mg'$  of Propofol. Image quality was adequate. The patient's vital signs; including heart rate, blood pressure, and oxygen saturation; remained stable throughout the procedure. The patient developed no complications during the procedure. A successful direct current cardioversion was performed at 120 joules with 1 attempt. IMPRESSIONS  1. Left atrial size was moderately dilated. No left atrial/left atrial appendage thrombus was detected. The LAA emptying velocity was 32 cm/s.  2. Right ventricular systolic function is mildly reduced. The right ventricular size is normal.  3. Right atrial size was moderately dilated.  4. The mitral valve is degenerative. Trivial mitral valve regurgitation.  5. Tricuspid valve  regurgitation is mild to moderate.  6. The aortic valve is grossly normal. Aortic valve regurgitation is mild.  7. There is mild (Grade II) atheroma plaque involving the aortic arch and descending aorta.  8. Limited study due to poor image quality. Conclusion(s)/Recommendation(s): No LA/LAA thrombus identified. Successful cardioversion performed with restoration of normal sinus rhythm. FINDINGS  Right Ventricle: The right ventricular size is normal. Right vetricular wall thickness was not well visualized. Right ventricular systolic function is mildly reduced. Left Atrium: Left atrial size was moderately dilated. No left atrial/left atrial appendage thrombus was detected. The LAA emptying velocity was 32  cm/s. Right Atrium: Right atrial size was moderately dilated. Pericardium: Trivial pericardial effusion is present. Mitral Valve: The mitral valve is degenerative in appearance. Trivial mitral valve regurgitation. Tricuspid Valve: The tricuspid valve is grossly normal. Tricuspid valve regurgitation is mild to moderate. Aortic Valve: The aortic valve is grossly normal. Aortic valve regurgitation is mild. Pulmonic Valve: The pulmonic valve was grossly normal. Pulmonic valve regurgitation is trivial. Aorta: Aortic root could not be assessed. There is mild (Grade II) atheroma plaque involving the aortic arch and descending aorta. IAS/Shunts: The interatrial septum was not assessed. Cherlynn Kaiser MD Electronically signed by Cherlynn Kaiser MD Signature Date/Time: 02/12/2022/3:25:45 PM    Final     Cardiac Studies   Echocardiogram 02/09/22   1. There is severe LBBB-related systolic dyssynchrony. Left ventricular  ejection fraction, by estimation, is 30 to 35%. The left ventricle has  moderately decreased function. The left ventricle demonstrates global  hypokinesis. There is mild concentric  left ventricular hypertrophy. Left ventricular diastolic function could  not be evaluated.   2. Right ventricular systolic function is moderately reduced. The right  ventricular size is normal. There is mildly elevated pulmonary artery  systolic pressure. The estimated right ventricular systolic pressure is  84.1 mmHg.   3. Left atrial size was mild to moderately dilated.   4. Right atrial size was moderately dilated.   5. The mitral valve is normal in structure. Mild mitral valve  regurgitation. No evidence of mitral stenosis.   6. Tricuspid valve regurgitation is moderate.   7. The aortic valve is tricuspid. Aortic valve regurgitation is mild. No  aortic stenosis is present.   8. There is mild dilatation of the ascending aorta, measuring 38 mm.   9. The inferior vena cava is dilated in size with <50%  respiratory  variability, suggesting right atrial pressure of 15 mmHg.   Patient Profile     88 y.o. male with a hx of HLD, HTN, LBBB, mild COPD, non-hodgkin's lymphoma and GERD who presented with worsening dyspnea on exertion and LE edema found to have new Aflutter with RVR and new heart failure for which Cardiology was consulted. TEE [facility] done 02/12/22   Assessment & Plan    Atrial Flutter with RVR: New onset, patient has been unaware of his rhythm.  Denies any history of the same.  -- post TEE/DCC in NSR with PACls  -- Continue eliquis 5 mg BID (new this admission)  -- Echocardiogram showed LVEF 30-35%, global hypokinesis, LBBB-related systolic dyssynchrony, moderately reduced RV function, mildly elevated pulmonary artery systolic pressure, moderately dilated RA and LA    Acute on Chronic HFrEF: History of HFpEF, presented significantly volume overloaded.  BNP 589 and chest x-ray with bilateral effusions on presentation. -- Echo this admission showed newly reduced EF 30-35%, moderately reduced RV function  -- Suspect that drop in EF is related to afib with RVR. May  improve with restoration of NSR  -- Has been on IV lasix '40mg'$  BID since admission. Output 1.7 L urine yesterday and is currently net -16L since admission. BNP down from 598>>289  -- Cr better 1.6-> 1.48 will start low dose oral lasix tomorrow  -- Will need an echocardiogram 6 weeks after restoration of NSR-- if patient continues to have a low EF, will need ischemic evaluation at that time  -- Continue metoprolol 12.'5mg'$  BID -- Now on jardiance 10 mg daily -- BP is stable but somewhat low. May not tolerate ACEi, ARB, ARNI, spiro at this time    Hypertension:  -- Now on metoprolol 12.5 mg twice daily. BP is tolerating, but there is little room to titrate further   COPD: No wheezing on exam.   Hyperlipidemia:  -- Patient was taking Simvastatin 40 mg PTA -- LDL 105, HDL 51, triglycerides 67 this admission  -- Transitioned to  lipitor 80 mg daily due to LDL of 105. Will need LFTs and lipid panel in 2-3 months    Signed, Jenkins Rouge, MD  02/13/2022, 9:13 AM

## 2022-02-13 NOTE — Assessment & Plan Note (Addendum)
Echocardiogram with reduced LV systolic function EF 30 to 35%, global hypokinesis, RV with moderate reduction in systolic function. RVSP 46,8, Moderate TR.  Acute on chronic core pulmonale.  Troponin elevation due to demand ischemia, no signs of acute coronary syndrome.   Urine output over last 24 hrs is documented 700 cc.  Net negative fluid balance since admission is -17,170 ml. Today with no lower extremity edema or JVD.  Systolic blood pressure 607 to 100 mmHg.   Plan to transition to oral furosemide, continue metoprolol and empagliflozin.  Holding on RAS inhibition due to risk of worsening renal function.

## 2022-02-13 NOTE — Progress Notes (Signed)
Progress Note   Patient: Henry Nichols KZS:[id number] DOB: October 02, 1931 DOA: 02/08/2022     4 DOS: the patient was seen and examined on 02/13/2022   Brief hospital course: Mr. Apodaca was admitted for acute on chronic CHF with massive volume overload, new diagnosis atrial fibrillation with RVR.   88 year old man PMH diastolic CHF presented with shortness of breath, recently treated for right testicular infection with doxycycline presented with leg swelling and new diagnosis of atrial fibrillation.  Patient reported about 6 days of painful lower extremity edema, associated with dyspnea. Patient was evaluated by his primary care provider and was found in atrial fibrillation. On his initial physical examination his blood pressure was 122/91, HR 109, RR 26 and 02 saturation 98%, lungs with no wheezing or rhonchi, heart with S1 and S2 present regular, positive systolic murmur, abdomen not distended, positive lower extremity edema.   Na 139, K 4,0 CL 104, bicarbonate 28, glucose 115 bun 13 cr 1,4 BNP 598  High sensitive troponin 31 and 30  Wbc 5,1 hgb 12,1 plt 282    Chest radiograph with mild cardiomegaly, right hemidiaphragm elevation, with small right pleural effusion, bilateral basal atelectasis.   EKG 110 bpm, normal axis, left bundle branch block, sinus rhythm with poor R wave progression, no significant ST segment or T wave changes. Positive LVH.   Patient was placed on IV diuretic therapy for diuresis with good response.   Follow up echocardiogram with reduced LV systolic function.   06/30 TEE cardioversion.        Assessment and Plan: * Acute on chronic combined systolic and diastolic CHF (congestive heart failure) (HCC) Echocardiogram with reduced LV systolic function EF 30 to 35%, global hypokinesis, RV with moderate reduction in systolic function. RVSP 46,8, Moderate TR.  Acute on chronic core pulmonale.  Troponin elevation due to demand ischemia, no signs of acute coronary  syndrome.   Urine output over last 24 hrs is documented 700 cc.  Net negative fluid balance since admission is -17,170 ml. Today with no lower extremity edema or JVD.  Systolic blood pressure 732 to 100 mmHg.   Plan to transition to oral furosemide, continue metoprolol and empagliflozin.  Holding on RAS inhibition due to risk of worsening renal function.   Paroxysmal atrial fibrillation with RVR (HCC) CHADsVASC score 4  Sp cardioversion on 06/30. Telemetry personally reviewed, patient continue in atrial fibrillation rhythm with rate in the 80 range.   Plan to continue rate control with metoprolol and anticoagulation with apixaban.  Continue telemetry monitoring. Out of bed to chair tid with meals.  PT and OT in preparation for possible discharge in am.    Acute kidney injury superimposed on chronic kidney disease ([place]) CKD stage 3a, hypokalemia.   Renal function with serum cr at 1,48, K is 4,1 and serum bicarbonate at 32.   Plan to transition to oral furosemide and follow up renal function in am.   Essential hypertension Blood pressure systolic in the low 202'R  Continue diuresis with furosemide and Av blockade with metoprolol.  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 -No type B symptoms currently.  -S/p Rituxan weekly x 4 doses in 2018  -continue to follow up with oncology as an outpatient; per last follow up visit note there was No clinical or lab evidence of follicular lymphoma progression.        Subjective: Patient is feeling better. Lower  extremity edema has improved, he has not been out of bed.   Physical Exam: Vitals:   02/12/22 2010 02/13/22 0032 02/13/22 0442 02/13/22 0729  BP: 127/65 117/84 117/78 100/69  Pulse: 82 74 85 80  Resp: '14 17 15 16  '$ Temp: 97.8 F (36.6 C) 98.2 F (36.8 C) 98.3 F (36.8 C) 98.1 F (36.7 C)   TempSrc: Oral Oral Oral Oral  SpO2: 95% 97% 95% 93%  Weight:   61.9 kg   Height:       Neurology awake and alert ENT with no pallor Cardiovascular with S1 and S2 present and irregular, no gallops, rubs or murmurs No JVD No lower extremity edema Respiratory with no wheezing, rales or rhonchi Abdomen not distended  Data Reviewed:    Family Communication: I spoke over the phone with the patient's son about patient's  condition, plan of care, prognosis and all questions were addressed.   Disposition: Status is: Inpatient Remains inpatient appropriate because: heart failure, possible discharge home tomorrow.   Planned Discharge Destination: Home     Author: Tawni Millers, MD 02/13/2022 10:06 AM  For on call review [email or web address].

## 2022-02-13 NOTE — Progress Notes (Signed)
Mobility Specialist Progress Note    02/13/22 1322  Mobility  Activity Ambulated with assistance in hallway  Level of Assistance Contact guard assist, steadying assist  Assistive Device Other (Comment) (HHA)  Distance Ambulated (ft) 450 ft  Activity Response Tolerated well  $Mobility charge 1 Mobility   Pre-Mobility: 79 HR During Mobility: 88 HR Post-Mobility: 78 HR  Pt received in bed and agreeable. No complaints on walk. Returned to bed with call bell in reach.    Hildred Alamin Mobility Specialist

## 2022-02-14 ENCOUNTER — Encounter (HOSPITAL_COMMUNITY): Payer: Self-pay | Admitting: Internal Medicine

## 2022-02-14 DIAGNOSIS — I4892 Unspecified atrial flutter: Secondary | ICD-10-CM | POA: Diagnosis not present

## 2022-02-14 DIAGNOSIS — I480 Paroxysmal atrial fibrillation: Secondary | ICD-10-CM | POA: Diagnosis not present

## 2022-02-14 DIAGNOSIS — I5043 Acute on chronic combined systolic (congestive) and diastolic (congestive) heart failure: Secondary | ICD-10-CM | POA: Diagnosis not present

## 2022-02-14 DIAGNOSIS — I10 Essential (primary) hypertension: Secondary | ICD-10-CM | POA: Diagnosis not present

## 2022-02-14 DIAGNOSIS — E7800 Pure hypercholesterolemia, unspecified: Secondary | ICD-10-CM | POA: Diagnosis not present

## 2022-02-14 LAB — BASIC METABOLIC PANEL
Anion gap: 12 (ref 5–15)
BUN: 19 mg/dL (ref 8–23)
CO2: 28 mmol/L (ref 22–32)
Calcium: 8.8 mg/dL — ABNORMAL LOW (ref 8.9–10.3)
Chloride: 96 mmol/L — ABNORMAL LOW (ref 98–111)
Creatinine, Ser: 1.27 mg/dL — ABNORMAL HIGH (ref 0.61–1.24)
GFR, Estimated: 55 mL/min — ABNORMAL LOW (ref >60)
Glucose, Bld: 109 mg/dL — ABNORMAL HIGH (ref 70–99)
Potassium: 3.5 mmol/L (ref 3.5–5.1)
Sodium: 136 mmol/L (ref 135–145)

## 2022-02-14 LAB — MAGNESIUM: Magnesium: 2 mg/dL (ref 1.7–2.4)

## 2022-02-14 MED ORDER — METOPROLOL TARTRATE 25 MG PO TABS: 12.5000 mg | ORAL_TABLET | Freq: Two times a day (BID) | ORAL | 0 refills | Status: DC

## 2022-02-14 MED ORDER — APIXABAN 5 MG PO TABS: 5.0000 mg | ORAL_TABLET | Freq: Two times a day (BID) | ORAL | 0 refills | Status: DC

## 2022-02-14 MED ORDER — LISINOPRIL 5 MG PO TABS
5.0000 mg | ORAL_TABLET | Freq: Every day | ORAL | Status: DC
  Administered 2022-02-14: 5 mg via ORAL
  Filled 2022-02-14: qty 1

## 2022-02-14 MED ORDER — POTASSIUM CHLORIDE CRYS ER 20 MEQ PO TBCR
40.0000 meq | EXTENDED_RELEASE_TABLET | Freq: Once | ORAL | Status: AC
Start: 2022-02-14 — End: 2022-02-14
  Administered 2022-02-14: 40 meq via ORAL
  Filled 2022-02-14: qty 2

## 2022-02-14 MED ORDER — FUROSEMIDE 20 MG PO TABS
20.0000 mg | ORAL_TABLET | Freq: Every day | ORAL | 0 refills | Status: DC
Start: 2022-02-15 — End: 2022-03-17

## 2022-02-14 MED ORDER — POTASSIUM CHLORIDE CRYS ER 20 MEQ PO TBCR: 20.0000 meq | EXTENDED_RELEASE_TABLET | Freq: Two times a day (BID) | ORAL | 0 refills | Status: DC

## 2022-02-14 MED ORDER — EMPAGLIFLOZIN 10 MG PO TABS: 10.0000 mg | ORAL_TABLET | Freq: Every day | ORAL | 0 refills | Status: AC

## 2022-02-14 MED ORDER — ATORVASTATIN CALCIUM 80 MG PO TABS: 80.0000 mg | ORAL_TABLET | Freq: Every day | ORAL | 0 refills | Status: DC

## 2022-02-14 MED ORDER — LISINOPRIL 5 MG PO TABS: 5.0000 mg | ORAL_TABLET | Freq: Every day | ORAL | 0 refills | Status: DC

## 2022-02-14 NOTE — Progress Notes (Signed)
Cardiology will call patient with follow up with Dr. Johney Frame. Information given to patient, son and daughter in law when reviewing discharge instructions. They deny further questions after AVS reviewed.  Patient transferred to entrance A in wheelchair by NT to meet son for discharge home. Patient will be staying with son until strong enough to return home.

## 2022-02-14 NOTE — Progress Notes (Signed)
Progress Note  Patient Name: Henry Nichols Date of Encounter: 02/14/2022  [facility] Cardiologist: Freada Bergeron, MD   Subjective   Some confusion in morning still   Inpatient Medications    Scheduled Meds:  apixaban  5 mg Oral BID   atorvastatin  80 mg Oral Daily   empagliflozin  10 mg Oral Daily   furosemide  20 mg Oral Daily   melatonin  10 mg Oral QHS   metoprolol tartrate  12.5 mg Oral BID   multivitamin  1 tablet Oral Daily   sodium chloride flush  3 mL Intravenous Q12H   Continuous Infusions:  sodium chloride     PRN Meds: sodium chloride, acetaminophen **OR** acetaminophen, haloperidol lactate, sodium chloride flush   Vital Signs    Vitals:   02/13/22 2005 02/13/22 2046 02/14/22 0020 02/14/22 0439  BP: 105/64 107/65 122/80 (!) 113/48  Pulse: 74 77 75 81  Resp: '18 16  18  '$ Temp: 98.2 F (36.8 C) 98 F (36.7 C) 98 F (36.7 C) 97.9 F (36.6 C)  TempSrc: Oral Oral Oral Oral  SpO2: 96%     Weight:    60.9 kg  Height:        Intake/Output Summary (Last 24 hours) at 02/14/2022 0834 Last data filed at 02/13/2022 2155 Gross per 24 hour  Intake 240 ml  Output --  Net 240 ml      02/14/2022    4:39 AM 02/13/2022    4:42 AM 02/12/2022   12:45 PM  Last 3 Weights  Weight (lbs) 134 lb 4.2 oz 136 lb 7.4 oz 137 lb 2 oz  Weight (kg) 60.9 kg 61.9 kg 62.2 kg      Telemetry    SR PACls 02/14/2022   ECG    No new tracings - Personally Reviewed  Physical Exam   Frail elderly male Lungs clear SEM Abdomen benign Venous stasis changes RLE   Labs    High Sensitivity Troponin:   Recent Labs  Lab 02/08/22 1407 02/08/22 1553  TROPONINIHS 31* 30*     Chemistry Recent Labs  Lab 02/08/22 1407 02/08/22 1553 02/09/22 0036 02/12/22 0025 02/13/22 0018 02/14/22 0358  NA 139  --    < > 138 139 136  K 4.0  --    < > 3.7 4.1 3.5  CL 104  --    < > 91* 94* 96*  CO2 28  --    < > 36* 32 28  GLUCOSE 115*  --    < > 117* 94 109*  BUN 13  --    < >  '22 18 19  '$ CREATININE 1.44*  --    < > 1.61* 1.48* 1.27*  CALCIUM 9.1  --    < > 8.9 8.9 8.8*  MG  --  2.1  --   --  1.9 2.0  PROT 6.5  --   --   --   --   --   ALBUMIN 3.1*  --   --   --   --   --   AST 31  --   --   --   --   --   ALT 22  --   --   --   --   --   ALKPHOS 74  --   --   --   --   --   BILITOT 1.6*  --   --   --   --   --  GFRNONAA 47*  --    < > 41* 46* 55*  ANIONGAP 7  --    < > '11 13 12   '$ < > = values in this interval not displayed.    Lipids  Recent Labs  Lab 02/09/22 0036  CHOL 169  TRIG 67  HDL 51  LDLCALC 105*  CHOLHDL 3.3    Hematology Recent Labs  Lab 02/08/22 1407 02/09/22 0036  WBC 5.1 4.7  RBC 3.75* 3.80*  HGB 12.1* 11.9*  HCT 37.7* 37.6*  MCV 100.5* 98.9  MCH 32.3 31.3  MCHC 32.1 31.6  RDW 15.3 15.4  PLT 282 274   Thyroid  Recent Labs  Lab 02/09/22 0036  TSH 3.648    BNP Recent Labs  Lab 02/08/22 1407 02/11/22 0427  BNP 598.9* 289.9*    DDimer No results for input(s): "DDIMER" in the last 168 hours.   Radiology    ECHO TEE  Result Date: 02/12/2022    TRANSESOPHOGEAL ECHO REPORT   Patient Name:   Henry Nichols Date of Exam: 02/12/2022 Medical Rec #:  [id number]        Height:       70.0 in Accession #:    [id number]       Weight:       137.1 lb Date of Birth:  Jan 08, 1932       BSA:          1.778 m Patient Age:    88 years         BP:           116/84 mmHg Patient Gender: M                HR:           111 bpm. Exam Location:  Inpatient Procedure: Transesophageal Echo, Cardiac Doppler and Color Doppler Indications:     Afib  History:         Patient has prior history of Echocardiogram examinations, most                  recent 02/09/2022. CAD, Arrythmias:LBBB; Risk                  Factors:Hypertension and Dyslipidemia.  Sonographer:     Darlina Sicilian RDCS Referring Phys:  [id number] Margie Billet Diagnosing Phys: Cherlynn Kaiser MD  Sonographer Comments: Limited TEE due to limited imaging windows. Unable to obtian deep gastric  imgaing. PROCEDURE: After discussion of the risks and benefits of a TEE, an informed consent was obtained from the patient. TEE procedure time was 6 minutes. The transesophogeal probe was passed without difficulty through the esophogus of the patient. Imaged were  obtained with the patient in a left lateral decubitus position. Local oropharyngeal anesthetic was provided with Cetacaine. Sedation performed by different physician. The patient was monitored while under deep sedation. Anesthestetic sedation was provided intravenously by Anesthesiology: 105.'88mg'$  of Propofol. Image quality was adequate. The patient's vital signs; including heart rate, blood pressure, and oxygen saturation; remained stable throughout the procedure. The patient developed no complications during the procedure. A successful direct current cardioversion was performed at 120 joules with 1 attempt. IMPRESSIONS  1. Left atrial size was moderately dilated. No left atrial/left atrial appendage thrombus was detected. The LAA emptying velocity was 32 cm/s.  2. Right ventricular systolic function is mildly reduced. The right ventricular size is normal.  3. Right atrial size was moderately dilated.  4. The mitral valve is  degenerative. Trivial mitral valve regurgitation.  5. Tricuspid valve regurgitation is mild to moderate.  6. The aortic valve is grossly normal. Aortic valve regurgitation is mild.  7. There is mild (Grade II) atheroma plaque involving the aortic arch and descending aorta.  8. Limited study due to poor image quality. Conclusion(s)/Recommendation(s): No LA/LAA thrombus identified. Successful cardioversion performed with restoration of normal sinus rhythm. FINDINGS  Right Ventricle: The right ventricular size is normal. Right vetricular wall thickness was not well visualized. Right ventricular systolic function is mildly reduced. Left Atrium: Left atrial size was moderately dilated. No left atrial/left atrial appendage thrombus was  detected. The LAA emptying velocity was 32 cm/s. Right Atrium: Right atrial size was moderately dilated. Pericardium: Trivial pericardial effusion is present. Mitral Valve: The mitral valve is degenerative in appearance. Trivial mitral valve regurgitation. Tricuspid Valve: The tricuspid valve is grossly normal. Tricuspid valve regurgitation is mild to moderate. Aortic Valve: The aortic valve is grossly normal. Aortic valve regurgitation is mild. Pulmonic Valve: The pulmonic valve was grossly normal. Pulmonic valve regurgitation is trivial. Aorta: Aortic root could not be assessed. There is mild (Grade II) atheroma plaque involving the aortic arch and descending aorta. IAS/Shunts: The interatrial septum was not assessed. Cherlynn Kaiser MD Electronically signed by Cherlynn Kaiser MD Signature Date/Time: 02/12/2022/3:25:45 PM    Final     Cardiac Studies   Echocardiogram 02/09/22   1. There is severe LBBB-related systolic dyssynchrony. Left ventricular  ejection fraction, by estimation, is 30 to 35%. The left ventricle has  moderately decreased function. The left ventricle demonstrates global  hypokinesis. There is mild concentric  left ventricular hypertrophy. Left ventricular diastolic function could  not be evaluated.   2. Right ventricular systolic function is moderately reduced. The right  ventricular size is normal. There is mildly elevated pulmonary artery  systolic pressure. The estimated right ventricular systolic pressure is  09.3 mmHg.   3. Left atrial size was mild to moderately dilated.   4. Right atrial size was moderately dilated.   5. The mitral valve is normal in structure. Mild mitral valve  regurgitation. No evidence of mitral stenosis.   6. Tricuspid valve regurgitation is moderate.   7. The aortic valve is tricuspid. Aortic valve regurgitation is mild. No  aortic stenosis is present.   8. There is mild dilatation of the ascending aorta, measuring 38 mm.   9. The inferior vena  cava is dilated in size with <50% respiratory  variability, suggesting right atrial pressure of 15 mmHg.   Patient Profile     88 y.o. male with a hx of HLD, HTN, LBBB, mild COPD, non-hodgkin's lymphoma and GERD who presented with worsening dyspnea on exertion and LE edema found to have new Aflutter with RVR and new heart failure for which Cardiology was consulted. TEE [facility] done 02/12/22   Assessment & Plan    Atrial Flutter with RVR: New onset, patient has been unaware of his rhythm.  Denies any history of the same.  -- post TEE/DCC 02/12/22 in NSR with PACls  -- Continue eliquis 5 mg BID (new this admission)  -- Echocardiogram showed LVEF 30-35%, global hypokinesis, LBBB-related systolic dyssynchrony, moderately reduced RV function, mildly elevated pulmonary artery systolic pressure, moderately dilated RA and LA    Acute on Chronic HFrEF: History of HFpEF, presented significantly volume overloaded.  BNP 589 and chest x-ray with bilateral effusions on presentation. -- Echo this admission showed newly reduced EF 30-35%, moderately reduced RV function  -- Hopefully will  improve with restoration of NSR  - BP and Cr a bit better try low dose ACE lisinopril 5 mg daily    Hypertension:  -- Now on metoprolol 12.5 mg twice daily.try low dose ACE for low EF hold for re elevation in Cr or systolic BP < 90    COPD: no active wheezing    Hyperlipidemia:  -- Patient was taking Simvastatin 40 mg PTA -- LDL 105, HDL 51, triglycerides 67 this admission  -- Transitioned to lipitor 80 mg daily due to LDL of 105. Will need LFTs and lipid panel in 2-3 months    Signed, Jenkins Rouge, MD  02/14/2022, 8:34 AM

## 2022-02-14 NOTE — Discharge Summary (Signed)
Physician Discharge Summary   Henry: Henry Nichols MRN: [id number] DOB: 10-Jun-1932  Admit date:     02/08/2022  Discharge date: 02/14/22  Discharge Physician: Jimmy Picket Earnest Thalman   PCP: [name], MD   Recommendations at discharge:    Henry will continue diuresis with furosemide 20 mg daily.  Continue rate control with metoprolol 12,5 mg po bid. Changed to atorvastatin 80 mg due to elevated LDL.  Started on apixaban for anticoagulation, discontinued aspirin.  Added lisinopril and empagliflozin for heart failure management.  Follow up renal function as outpatient in 7 days Follow up with Dr Kenton Kingfisher in 7 to 10 days Follow up with Cardiology as scheduled.   Discharge Diagnoses: Principal Problem:   Acute on chronic combined systolic and diastolic CHF (congestive heart failure) (HCC) Active Problems:   Atrial flutter (HCC)   Acute kidney injury superimposed on chronic kidney disease (HCC)   Essential hypertension   Hyperlipidemia   Non-Hodgkin lymphoma (HCC)  Resolved Problems:   * No resolved hospital problems. Henry Nichols was admitted for acute on chronic CHF with massive volume overload, new diagnosis atrial fibrillation with RVR.   88 year old man PMH diastolic CHF presented with shortness of breath, recently treated for right testicular infection with doxycycline presented with leg swelling and new diagnosis of atrial fibrillation.  Henry reported about 6 days of painful lower extremity edema, associated with dyspnea. Henry was evaluated by his primary care provider and was found in atrial fibrillation. On his initial physical examination his blood pressure was 122/91, HR 109, RR 26 and 02 saturation 98%, lungs with no wheezing or rhonchi, heart with S1 and S2 present regular, positive systolic murmur, abdomen not distended, positive lower extremity edema.   Na 139, K 4,0 CL 104, bicarbonate 28, glucose 115 bun 13 cr 1,4 BNP 598  High  sensitive troponin 31 and 30  Wbc 5,1 hgb 12,1 plt 282    Chest radiograph with mild cardiomegaly, right hemidiaphragm elevation, with small right pleural effusion, bilateral basal atelectasis.   EKG 110 bpm, normal axis, left bundle branch block, atrial flutter with poor R wave progression, no significant ST segment or T wave changes. Positive LVH.   Henry was placed on IV diuretic therapy for diuresis with good response.   Follow up echocardiogram with reduced LV systolic function.   06/30 TEE cardioversion, converting to sinus rhythm.       Assessment and Plan: * Acute on chronic combined systolic and diastolic CHF (congestive heart failure) (HCC) Echocardiogram with reduced LV systolic function EF 30 to 35%, global hypokinesis, RV with moderate reduction in systolic function. RVSP 46,8, Moderate TR.  Acute on chronic core pulmonale.  Troponin elevation due to demand ischemia, no signs of acute coronary syndrome.   Henry was placed on furosemide for diuresis, negative fluid balance was achieved, -16,930 ml, with significant improvement in his symptoms.    Henry will continue heart failure management with metoprolol, lisinopril and empagliflozin.  Further titration of cardiac regimen as outpatient.   Atrial flutter ([place]) CHADsVASC score 4 Henry with atrial flutter, paroxysmal atrial fibrillation.   Sp cardioversion on 06/30.  Plan to continue rate control with metoprolol and anticoagulation with apixaban.  Follow up as outpatient.   Acute kidney injury superimposed on chronic kidney disease (HCC) CKD stage 3a, hypokalemia.   Henry tolerated well diuresis with furosemide, negative fluid balance was achieved.  At the time of his discharge his renal function has a serum cr of  1.27 with K at 3,5 and serum bicarbonate at 28.   Plan to continue diuresis with furosemide and follow up renal function as outpatient.   Essential hypertension Continue blood pressure  control with metoprolol and added lisinopril at the time of his discharge.   Hyperlipidemia Henry changed to high dose high potency satin therapy with atorvastatin 80 mg daily, because elevated LDL.  Plan to [name].   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 -No type B symptoms currently.  -S/p Rituxan weekly x 4 doses in 2018  -continue to follow up with oncology as an outpatient; per last follow up visit note there was No clinical or lab evidence of follicular lymphoma progression.         Consultants: cardiology  Procedures performed: direct current cardioversion   Disposition: Home Diet recommendation:  Cardiac diet DISCHARGE MEDICATION: Allergies as of 02/14/2022   No Known Allergies      Medication List     STOP taking these medications    aspirin EC 81 MG tablet   atenolol-chlorthalidone 100-25 MG tablet Commonly known as: TENORETIC   doxycycline 100 MG tablet Commonly known as: VIBRA-TABS   pantoprazole 40 MG tablet Commonly known as: PROTONIX   simvastatin 40 MG tablet Commonly known as: ZOCOR       TAKE these medications    acetaminophen 325 MG tablet Commonly known as: Tylenol Take 2 tablets (650 mg total) by mouth every 6 (six) hours as needed for mild pain or moderate pain.   albuterol 108 (90 Base) MCG/ACT inhaler Commonly known as: VENTOLIN HFA Inhale 1 puff every 6 (six) hours as needed into the lungs for wheezing or shortness of breath.   apixaban 5 MG Tabs tablet Commonly known as: ELIQUIS Take 1 tablet (5 mg total) by mouth 2 (two) times daily.   atorvastatin 80 MG tablet Commonly known as: LIPITOR Take 1 tablet (80 mg total) by mouth daily. Start taking on: February 15, 2022   empagliflozin 10 MG Tabs tablet Commonly known as: JARDIANCE Take 1 tablet (10 mg total) by mouth daily. Start taking on: February 15, 2022    furosemide 20 MG tablet Commonly known as: LASIX Take 1 tablet (20 mg total) by mouth daily. Start taking on: February 15, 2022   lisinopril 5 MG tablet Commonly known as: ZESTRIL Take 1 tablet (5 mg total) by mouth daily. Start taking on: February 15, 2022   metoprolol tartrate 25 MG tablet Commonly known as: LOPRESSOR Take 0.5 tablets (12.5 mg total) by mouth 2 (two) times daily.   potassium chloride SA 20 MEQ tablet Commonly known as: KLOR-CON M Take 40 mEq daily by mouth.   PRESERVISION AREDS 2 PO Take 1 capsule 2 (two) times daily by mouth.   triamcinolone cream 0.1 % Commonly known as: KENALOG Apply 1 application daily as needed topically (for dry skin).        Discharge Exam: Filed Weights   02/12/22 1245 02/13/22 0442 02/14/22 0439  Weight: 62.2 kg 61.9 kg 60.9 kg   BP 114/78 (BP Location: Right Arm)   Pulse 82   Temp 98.3 F (36.8 C) (Oral)   Resp 17   Ht '5\' 10"'$  (1.778 m)   Wt 60.9 kg   SpO2 95%   BMI 19.26 kg/m   Henry is feeling better, he is out of bed and has been ambulating  Neurology awake and alert ENT with no pallor  Cardiovascular with S1 and S2 present and rhythmic with no gallops, rubs or murmurs Respiratory with no rales or wheezing Abdomen with no distention No lower extremity edema   Condition at discharge: stable  The results of significant diagnostics from this hospitalization (including imaging, microbiology, ancillary and laboratory) are listed below for reference.   Imaging Studies: ECHO TEE  Result Date: 02/12/2022    TRANSESOPHOGEAL ECHO REPORT   Henry Nichols Date of Exam: 02/12/2022 Medical Rec #:  [id number]        Height:       70.0 in Accession #:    [id number]       Weight:       137.1 lb Date of Birth:  1932/08/08       BSA:          1.778 m Henry Age:    23 years         BP:           116/84 mmHg Henry Gender: M                HR:           111 bpm. Exam Location:  Inpatient Procedure: Transesophageal  Echo, Cardiac Doppler and Color Doppler Indications:     Afib  History:         Henry has prior history of Echocardiogram examinations, most                  recent 02/09/2022. CAD, Arrythmias:LBBB; Risk                  Factors:Hypertension and Dyslipidemia.  Sonographer:     Darlina Sicilian RDCS Referring Phys:  [id number] Margie Billet Diagnosing Phys: Cherlynn Kaiser MD  Sonographer Comments: Limited TEE due to limited imaging windows. Unable to obtian deep gastric imgaing. PROCEDURE: After discussion of the risks and benefits of a TEE, an informed consent was obtained from the Henry. TEE procedure time was 6 minutes. The transesophogeal probe was passed without difficulty through the esophogus of the Henry. Imaged were  obtained with the Henry in a left lateral decubitus position. Local oropharyngeal anesthetic was provided with Cetacaine. Sedation performed by different physician. The Henry was monitored while under deep sedation. Anesthestetic sedation was provided intravenously by Anesthesiology: 105.'88mg'$  of Propofol. Image quality was adequate. The Henry's vital signs; including heart rate, blood pressure, and oxygen saturation; remained stable throughout the procedure. The Henry developed no complications during the procedure. A successful direct current cardioversion was performed at 120 joules with 1 attempt. IMPRESSIONS  1. Left atrial size was moderately dilated. No left atrial/left atrial appendage thrombus was detected. The LAA emptying velocity was 32 cm/s.  2. Right ventricular systolic function is mildly reduced. The right ventricular size is normal.  3. Right atrial size was moderately dilated.  4. The mitral valve is degenerative. Trivial mitral valve regurgitation.  5. Tricuspid valve regurgitation is mild to moderate.  6. The aortic valve is grossly normal. Aortic valve regurgitation is mild.  7. There is mild (Grade II) atheroma plaque involving the aortic arch and descending  aorta.  8. Limited study due to poor image quality. Conclusion(s)/Recommendation(s): No LA/LAA thrombus identified. Successful cardioversion performed with restoration of normal sinus rhythm. FINDINGS  Right Ventricle: The right ventricular size is normal. Right vetricular wall thickness was not well visualized. Right ventricular systolic function is mildly reduced. Left Atrium: Left atrial size was moderately dilated. No  left atrial/left atrial appendage thrombus was detected. The LAA emptying velocity was 32 cm/s. Right Atrium: Right atrial size was moderately dilated. Pericardium: Trivial pericardial effusion is present. Mitral Valve: The mitral valve is degenerative in appearance. Trivial mitral valve regurgitation. Tricuspid Valve: The tricuspid valve is grossly normal. Tricuspid valve regurgitation is mild to moderate. Aortic Valve: The aortic valve is grossly normal. Aortic valve regurgitation is mild. Pulmonic Valve: The pulmonic valve was grossly normal. Pulmonic valve regurgitation is trivial. Aorta: Aortic root could not be assessed. There is mild (Grade II) atheroma plaque involving the aortic arch and descending aorta. IAS/Shunts: The interatrial septum was not assessed. Cherlynn Kaiser MD Electronically signed by Cherlynn Kaiser MD Signature Date/Time: 02/12/2022/3:25:45 PM    Final    DG Chest 2 View  Result Date: 02/11/2022 CLINICAL DATA:  Shortness of breath EXAM: CHEST - 2 VIEW COMPARISON:  Previous studies including the examination of 02/09/2019 FINDINGS: Transverse diameter of heart is increased. Central pulmonary vessels are prominent. There are no signs of alveolar pulmonary edema. Small to moderate bilateral pleural effusions are seen, more so on the right side. Evaluation of lower lung fields for infiltrates is limited by the effusions. There are no signs of alveolar pulmonary edema. There is no pneumothorax. IMPRESSION: Cardiomegaly. Bilateral pleural effusions, more so on the right side.  Possibility of underlying atelectasis/pneumonia in the lower lung fields is not excluded. Electronically Signed   By: Elmer Picker M.D.   On: 02/11/2022 08:28   ECHOCARDIOGRAM COMPLETE  Result Date: 02/09/2022    ECHOCARDIOGRAM REPORT   Henry Nichols Date of Exam: 02/09/2022 Medical Rec #:  [id number]        Height:       70.0 in Accession #:    [id number]       Weight:       161.8 lb Date of Birth:  Jul 09, 1932       BSA:          1.908 m Henry Age:    33 years         BP:           100/77 mmHg Henry Gender: M                HR:           110 bpm. Exam Location:  Inpatient Procedure: 2D Echo, Cardiac Doppler and Color Doppler Indications:    CHF  History:        Henry has no prior history of Echocardiogram examinations.                 Arrythmias:Atrial Fibrillation and LBBB; Risk                 Factors:Hypertension.  Sonographer:    Jyl Heinz Referring Phys: [place]  1. There is severe LBBB-related systolic dyssynchrony. Left ventricular ejection fraction, by estimation, is 30 to 35%. The left ventricle has moderately decreased function. The left ventricle demonstrates global hypokinesis. There is mild concentric left ventricular hypertrophy. Left ventricular diastolic function could not be evaluated.  2. Right ventricular systolic function is moderately reduced. The right ventricular size is normal. There is mildly elevated pulmonary artery systolic pressure. The estimated right ventricular systolic pressure is 09.3 mmHg.  3. Left atrial size was mild to moderately dilated.  4. Right atrial size was moderately dilated.  5. The mitral valve is normal in structure. Mild mitral valve regurgitation. No  evidence of mitral stenosis.  6. Tricuspid valve regurgitation is moderate.  7. The aortic valve is tricuspid. Aortic valve regurgitation is mild. No aortic stenosis is present.  8. There is mild dilatation of the ascending aorta, measuring 38 mm.  9.  The inferior vena cava is dilated in size with <50% respiratory variability, suggesting right atrial pressure of 15 mmHg. FINDINGS  Left Ventricle: There is severe LBBB-related systolic dyssynchrony. Left ventricular ejection fraction, by estimation, is 30 to 35%. The left ventricle has moderately decreased function. The left ventricle demonstrates global hypokinesis. The left ventricular internal cavity size was normal in size. There is mild concentric left ventricular hypertrophy. Abnormal (paradoxical) septal motion, consistent with left bundle branch block. Left ventricular diastolic function could not be evaluated due to atrial fibrillation. Left ventricular diastolic function could not be evaluated. Right Ventricle: The right ventricular size is normal. No increase in right ventricular wall thickness. Right ventricular systolic function is moderately reduced. There is mildly elevated pulmonary artery systolic pressure. The tricuspid regurgitant velocity is 2.82 m/s, and with an assumed right atrial pressure of 15 mmHg, the estimated right ventricular systolic pressure is 09.7 mmHg. Left Atrium: Left atrial size was mild to moderately dilated. Right Atrium: Right atrial size was moderately dilated. Pericardium: There is no evidence of pericardial effusion. Mitral Valve: The mitral valve is normal in structure. Mild mitral annular calcification. Mild mitral valve regurgitation, with centrally-directed jet. No evidence of mitral valve stenosis. Tricuspid Valve: The tricuspid valve is normal in structure. Tricuspid valve regurgitation is moderate. Aortic Valve: The aortic valve is tricuspid. Aortic valve regurgitation is mild. Aortic regurgitation PHT measures 463 msec. No aortic stenosis is present. Aortic valve peak gradient measures 2.7 mmHg. Pulmonic Valve: The pulmonic valve was grossly normal. Pulmonic valve regurgitation is not visualized. Aorta: The aortic root is normal in size and structure. There is  mild dilatation of the ascending aorta, measuring 38 mm. Venous: The inferior vena cava is dilated in size with less than 50% respiratory variability, suggesting right atrial pressure of 15 mmHg. IAS/Shunts: No atrial level shunt detected by color flow Doppler.  LEFT VENTRICLE PLAX 2D LVIDd:         3.70 cm     Diastology LVIDs:         2.80 cm     LV e' medial:    6.53 cm/s LV PW:         1.20 cm     LV E/e' medial:  12.5 LV IVS:        1.10 cm     LV e' lateral:   6.96 cm/s LVOT diam:     2.00 cm     LV E/e' lateral: 11.7 LV SV:         32 LV SV Index:   17 LVOT Area:     3.14 cm  LV Volumes (MOD) LV vol d, MOD A2C: 71.2 ml LV vol d, MOD A4C: 79.4 ml LV vol s, MOD A2C: 39.9 ml LV vol s, MOD A4C: 45.8 ml LV SV MOD A2C:     31.3 ml LV SV MOD A4C:     79.4 ml LV SV MOD BP:      34.2 ml RIGHT VENTRICLE            IVC RV Basal diam:  3.50 cm    IVC diam: 2.40 cm RV Mid diam:    2.90 cm RV S prime:     6.31 cm/s TAPSE (M-mode):  1.3 cm LEFT ATRIUM             Index        RIGHT ATRIUM           Index LA diam:        4.20 cm 2.20 cm/m   RA Area:     26.40 cm LA Vol (A2C):   47.0 ml 24.64 ml/m  RA Volume:   86.50 ml  45.34 ml/m LA Vol (A4C):   71.1 ml 37.27 ml/m LA Biplane Vol: 60.4 ml 31.66 ml/m  AORTIC VALVE AV Area (Vmax): 2.52 cm AV Vmax:        81.40 cm/s AV Peak Grad:   2.7 mmHg LVOT Vmax:      65.35 cm/s LVOT Vmean:     48.950 cm/s LVOT VTI:       0.103 m AI PHT:         463 msec  AORTA Ao Root diam: 3.70 cm Ao Asc diam:  3.80 cm MITRAL VALVE               TRICUSPID VALVE MV Area (PHT): 5.13 cm    TR Peak grad:   31.8 mmHg MV Decel Time: 148 msec    TR Vmax:        282.00 cm/s MR Peak grad: 71.3 mmHg MR Mean grad: 50.0 mmHg    SHUNTS MR Vmax:      422.33 cm/s  Systemic VTI:  0.10 m MR Vmean:     345.0 cm/s   Systemic Diam: 2.00 cm MV E velocity: 81.40 cm/s MV A velocity: 52.70 cm/s MV E/A ratio:  1.54 Mihai Croitoru MD Electronically signed by Sanda Klein MD Signature Date/Time: 02/09/2022/11:14:44 AM     Final    DG Chest 2 View  Result Date: 02/08/2022 CLINICAL DATA:  Shortness of breath and bilateral lower extremity swelling. Atrial fibrillation. EXAM: CHEST - 2 VIEW COMPARISON:  Chest CT 10/16/2019.  Chest radiographs 12/06/2006. FINDINGS: The right heart border is obscured. There are new small bilateral pleural effusions, right larger than left, with bibasilar airspace opacities. The interstitial markings are increased bilaterally with Kerley lines noted on the left. No pneumothorax is identified. No acute osseous abnormality is identified. IMPRESSION: New small bilateral pleural effusions, mild interstitial edema, and bibasilar atelectasis or consolidation. Electronically Signed   By: Logan Bores M.D.   On: 02/08/2022 14:41   MR PELVIS W WO CONTRAST  Result Date: 01/23/2022 CLINICAL DATA:  right-sided orchitis on ultrasound exam EXAM: MRI PELVIS WITHOUT AND WITH CONTRAST TECHNIQUE: Multiplanar multisequence MR imaging of the pelvis was performed both before and after administration of intravenous contrast. CONTRAST:  65m MULTIHANCE GADOBENATE DIMEGLUMINE 529 MG/ML IV SOLN COMPARISON:  Ultrasound exam 01/18/2022 FINDINGS: Urinary Tract:  Bladder unremarkable. Bowel:  Limited visualization of pelvic bowel is unremarkable. Vascular/Lymphatic: 17 mm short axis lymph node identified in the right groin with a second 13 mm short axis right groin node. Reproductive: The prostate gland and seminal vesicles are unremarkable. Diffuse hyperenhancement noted in the anterior right testicle, nonspecific. Mild right hydrocele noted with physiologic fluid evident in the left hemiscrotum. Left testicle unremarkable. Prominent vascular anatomy in the left hemiscrotum better assessed by ultrasound. Other: Subcutaneous and edema noted in the low pelvis and proximal thighs. Musculoskeletal: No focal suspicious marrow enhancement within the visualized bony anatomy. IMPRESSION: 1. Diffuse hyperenhancement of the anterior  right testicle corresponding to areas of abnormal low T2 signal intensity, nonspecific. There is no discrete intratesticular  mass lesion evident. Ill-defined heterogeneous echogenicity was seen in the right testicle on recent ultrasound exam. Lymphoma and leukemia can present with this appearance and close follow-up recommended. CT of the abdomen and pelvis may prove helpful to evaluate for retroperitoneal lymphadenopathy. Henry is noted to have enlarged right groin nodes which are new since prior CT of 10/16/2019. As infection/inflammation could also generate this MR appearance, correlation for signs/symptoms of epididymal orchitis suggested. 2. Mild right hydrocele. 3. Prominent vascular anatomy in the left hemiscrotum better assessed by recent ultrasound. 4. Subcutaneous and edema in the low pelvis and proximal thighs. Electronically Signed   By: Misty Stanley M.D.   On: 01/23/2022 15:38   US SCROTUM W/DOPPLER  Result Date: 01/19/2022 CLINICAL DATA:  Right-sided scrotal swelling x2 weeks. EXAM: SCROTAL ULTRASOUND DOPPLER ULTRASOUND OF THE TESTICLES TECHNIQUE: Complete ultrasound examination of the testicles, epididymis, and other scrotal structures was performed. Color and spectral Doppler ultrasound were also utilized to evaluate blood flow to the testicles. COMPARISON:  None Available. FINDINGS: Right testicle Measurements: 4.1 cm x 2.3 cm x 2.9 cm. A portion of the testicular parenchyma is heterogeneous in appearance. This area demonstrates asymmetrically increased flow on color Doppler evaluation when compared to the left testicle. No mass or microlithiasis visualized. Left testicle Measurements: 3.9 cm x 2.8 cm x 3.5 cm. No mass or microlithiasis visualized. Right epididymis:  Normal in size and appearance. Left epididymis:  Normal in size and appearance. Hydrocele:  There is a moderate to large right-sided hydrocele. Varicocele:  A left-sided varicocele is noted. Pulsed Doppler interrogation of both  testes demonstrates normal low resistance arterial and venous waveforms bilaterally. IMPRESSION: 1. Findings suggestive of right-sided orchitis. MRI correlation is recommended. 2. Moderate to large right-sided hydrocele. 3. Left-sided varicocele. Electronically Signed   By: Virgina Norfolk M.D.   On: 01/19/2022 01:42    Microbiology: No results found for this or any previous visit.  Labs: CBC: Recent Labs  Lab 02/08/22 1407 02/09/22 0036  WBC 5.1 4.7  NEUTROABS 3.3  --   HGB 12.1* 11.9*  HCT 37.7* 37.6*  MCV 100.5* 98.9  PLT 282 409   Basic Metabolic Panel: Recent Labs  Lab 02/08/22 1553 02/09/22 0036 02/10/22 0223 02/11/22 0427 02/12/22 0025 02/13/22 0018 02/14/22 0358  NA  --    < > 139 142 138 139 136  K  --    < > 3.5 3.2* 3.7 4.1 3.5  CL  --    < > 95* 91* 91* 94* 96*  CO2  --    < > 33* 37* 36* 32 28  GLUCOSE  --    < > 110* 88 117* 94 109*  BUN  --    < > 25* '20 22 18 19  '$ CREATININE  --    < > 1.55* 1.36* 1.61* 1.48* 1.27*  CALCIUM  --    < > 9.1 9.3 8.9 8.9 8.8*  MG 2.1  --   --   --   --  1.9 2.0   < > = values in this interval not displayed.   Liver Function Tests: Recent Labs  Lab 02/08/22 1407  AST 31  ALT 22  ALKPHOS 74  BILITOT 1.6*  PROT 6.5  ALBUMIN 3.1*   CBG: No results for input(s): "GLUCAP" in the last 168 hours.  Discharge time spent: greater than 30 minutes.  Signed: Tawni Millers, MD [facility] 02/14/2022

## 2022-02-14 NOTE — Evaluation (Signed)
Physical Therapy Evaluation Patient Details Name: Henry Nichols MRN: [id number] DOB: 01-10-1932 Today's Date: 02/14/2022  History of Present Illness  88 y.o. male presents to [facility] hospital on 02/08/2022 with acute on chronic CHF and significant volume overload, as well as afib with RVR. Pt underwent cardioversion on 6/30. PMH includes CHF, HTN, HLD, LBBB, small bowel mass.  Clinical Impression  Pt tolerates therapy well today, ambulating community distances independently. PT impairments include mild generalized weakness and gait deficits. PT believes that Pt is likely at or close to his baseline. No further PT recommended during [name] current admission.       Recommendations for follow up therapy are one component of a multi-disciplinary discharge planning process, led by the attending physician.  Recommendations may be updated based on patient status, additional functional criteria and insurance authorization.  Follow Up Recommendations No PT follow up      Assistance Recommended at Discharge PRN  Patient can return home with the following  Other (comment) (Assistance as necessary)    Equipment Recommendations None recommended by PT  Recommendations for Other Services       Functional Status Assessment Patient has not had a recent decline in their functional status     Precautions / Restrictions Precautions Precautions: Fall Precaution Comments: (P) watch HR Restrictions Weight Bearing Restrictions: No      Mobility  Bed Mobility                    Transfers Overall transfer level: Modified independent Equipment used: None               General transfer comment: Pt uses arms of chair for sit to stand transfer    Ambulation/Gait Ambulation/Gait assistance: Independent   Assistive device: None Gait Pattern/deviations: Step-through pattern, Decreased step length - right, Decreased step length - left, Decreased stride length Gait velocity: decreased Gait  velocity interpretation: <1.8 ft/sec, indicate of risk for recurrent falls   General Gait Details: Pt ambulates with supervision but performs well enough to be classified as independent; Reports he believes he could have made it to the opposite end of the hall and back again without requiring rest  Stairs            Wheelchair Mobility    Modified Rankin (Stroke Patients Only)       Balance Overall balance assessment: Independent                                           Pertinent Vitals/Pain Pain Assessment Pain Assessment: No/denies pain    Home Living Family/patient expects to be discharged to:: Private residence Living Arrangements: Other relatives Henry Nichols and his family) Available Help at Discharge: Family;Available 24 hours/day Type of Home: House Home Access: Stairs to enter Entrance Stairs-Rails: None Entrance Stairs-Number of Steps: 1   Home Layout: One level Home Equipment: Cane - single point;Rollator (4 wheels);Grab bars - toilet;Grab bars - tub/shower Additional Comments: Pt reports that he will likely stay with his son for a few days upon discharge, rather than his typically home. 24/7 assist. available    Prior Function Prior Level of Function : Independent/Modified Independent;Driving             Mobility Comments: Pt reports he has AD at home but doesn't use them ADLs Comments: Says he is able to perform his ADLs but has  available help if necessary     Hand Dominance        Extremity/Trunk Assessment   Upper Extremity Assessment Upper Extremity Assessment: Generalized weakness    Lower Extremity Assessment Lower Extremity Assessment: Generalized weakness    Cervical / Trunk Assessment Cervical / Trunk Assessment: Kyphotic  Communication   Communication: HOH  Cognition Arousal/Alertness: Awake/alert Behavior During Therapy: WFL for tasks assessed/performed Overall Cognitive Status: Impaired/Different from  baseline Area of Impairment: Orientation                 Orientation Level: Time, Disoriented to             General Comments: Anticipate Pt may be at his baseline        General Comments      Exercises     Assessment/Plan    PT Assessment Patient does not need any further PT services  PT Problem List         PT Treatment Interventions      PT Goals (Current goals can be found in the Care Plan section)  Acute Rehab PT Goals Patient Stated Goal: Return home PT Goal Formulation: With patient Time For Goal Achievement: 02/28/22 Potential to Achieve Goals: Good    Frequency       Co-evaluation               AM-PAC PT "6 Clicks" Mobility  Outcome Measure Help needed turning from your back to your side while in a flat bed without using bedrails?: None Help needed moving from lying on your back to sitting on the side of a flat bed without using bedrails?: None Help needed moving to and from a bed to a chair (including a wheelchair)?: None Help needed standing up from a chair using your arms (e.g., wheelchair or bedside chair)?: None Help needed to walk in hospital room?: None Help needed climbing 3-5 steps with a railing? : None 6 Click Score: 24    End of Session   Activity Tolerance: Patient tolerated treatment well Patient left: in chair;with call bell/phone within reach;with chair alarm set Nurse Communication: Mobility status PT Visit Diagnosis: Muscle weakness (generalized) (M62.81);Other abnormalities of gait and mobility (R26.89)    Time: 4315-4008 PT Time Calculation (min) (ACUTE ONLY): 22 min   Charges:   PT Evaluation $PT Eval Low Complexity: 1 Low          Hall Busing, SPT Acute Rehabilitation Office #: [phone]   Hall Busing 02/14/2022, 11:07 AM

## 2022-02-14 NOTE — Evaluation (Signed)
Occupational Therapy Evaluation Patient Details Name: Henry Nichols MRN: [id number] DOB: 1931-12-29 Today's Date: 02/14/2022   History of Present Illness 88 yo male presenting to ED on 6/26 with SOB and LE edema. Admitted for acute on chronic CHF with massive volume overload, new diagnosis atrial fibrillation with RVR. S/p cardioversion on 6/30. PMH includes CHF, HTN, HLD, LBBB, small bowel mass.   Clinical Impression   PTA, pt was living with his grandson, grandson's wife, and their 68 mo and reports he was independent with ADLs and IADLs.  Pt reporting he plans to dc to his son's home for increased support. Pt currently performing at Supervision level and feel he is close to baseline function. VSS. Pt would benefit from further acute OT to facilitate safe dc. Recommend dc to home once medically stable per physician.       Recommendations for follow up therapy are one component of a multi-disciplinary discharge planning process, led by the attending physician.  Recommendations may be updated based on patient status, additional functional criteria and insurance authorization.   Follow Up Recommendations  No OT follow up    Assistance Recommended at Discharge PRN  Patient can return home with the following      Functional Status Assessment  Patient has had a recent decline in their functional status and demonstrates the ability to make significant improvements in function in a reasonable and predictable amount of time.  Equipment Recommendations  None recommended by OT    Recommendations for Other Services       Precautions / Restrictions Precautions Precautions: Fall;Other (comment) Precaution Comments: watch HR Restrictions Weight Bearing Restrictions: No      Mobility Bed Mobility Overal bed mobility: Modified Independent             General bed mobility comments: increased time    Transfers Overall transfer level: Needs assistance   Transfers: Sit to/from  Stand Sit to Stand: Supervision           General transfer comment: general safety      Balance Overall balance assessment: No apparent balance deficits (not formally assessed)                                         ADL either performed or assessed with clinical judgement   ADL Overall ADL's : Needs assistance/impaired Eating/Feeding: Set up;Sitting   Grooming: Oral care;Wash/dry face;Wash/dry hands;Supervision/safety;Set up;Standing   Upper Body Bathing: Set up;Sitting   Lower Body Bathing: Sit to/from stand;Supervison/ safety   Upper Body Dressing : Set up;Sitting   Lower Body Dressing: Sit to/from stand;Supervision/safety   Toilet Transfer: Supervision/safety           Functional mobility during ADLs: Supervision/safety General ADL Comments: Pt performing at Supervision for safety. Feel he is close to baseline.     Vision         Perception     Praxis      Pertinent Vitals/Pain Pain Assessment Pain Assessment: No/denies pain     Hand Dominance Right   Extremity/Trunk Assessment Upper Extremity Assessment Upper Extremity Assessment: Generalized weakness   Lower Extremity Assessment Lower Extremity Assessment: Generalized weakness   Cervical / Trunk Assessment Cervical / Trunk Assessment: Kyphotic   Communication Communication Communication: HOH   Cognition Arousal/Alertness: Awake/alert Behavior During Therapy: WFL for tasks assessed/performed Overall Cognitive Status: Impaired/Different from baseline Area of Impairment: Problem solving  Problem Solving: Slow processing General Comments: Increased time. Feel this is baseline cognition     General Comments  VSS    Exercises     Shoulder Instructions      Home Living Family/patient expects to be discharged to:: Private residence Living Arrangements: Spouse/significant other Available Help at Discharge: Family (grandson, his  wife, and grandbaby) Type of Home: House Home Access: Stairs to enter Technical brewer of Steps: 1 Entrance Stairs-Rails: None Home Layout: One level     Bathroom Shower/Tub: Tub/shower unit;Walk-in shower   Bathroom Toilet: Standard     Home Equipment: Cane - single point;Rollator (4 wheels);Grab bars - toilet;Grab bars - tub/shower   Additional Comments: Pt reports that he will likely stay with his son for a few days upon discharge, rather than his typically home. 24/7 assist. available      Prior Functioning/Environment Prior Level of Function : Independent/Modified Independent;Driving;Working/employed             Mobility Comments: Reports he does not use DME but has it just in case ADLs Comments: Performs ADLs, IADLs, and drives. Enjoys going to his son's work nad "helping out"        OT Problem List: Decreased activity tolerance;Decreased range of motion;Decreased knowledge of use of DME or AE;Decreased knowledge of precautions      OT Treatment/Interventions: Self-care/ADL training;Therapeutic exercise;Energy conservation;DME and/or AE instruction;Therapeutic activities;Patient/family education    OT Goals(Current goals can be found in the care plan section) Acute Rehab OT Goals Patient Stated Goal: Go home OT Goal Formulation: With patient Time For Goal Achievement: 02/28/22 Potential to Achieve Goals: Good  OT Frequency: Min 2X/week    Co-evaluation              AM-PAC OT "6 Clicks" Daily Activity     Outcome Measure Help from another person eating meals?: None Help from another person taking care of personal grooming?: A Little Help from another person toileting, which includes using toliet, bedpan, or urinal?: A Little Help from another person bathing (including washing, rinsing, drying)?: A Little Help from another person to put on and taking off regular upper body clothing?: A Little Help from another person to put on and taking off regular  lower body clothing?: A Little 6 Click Score: 19   End of Session Nurse Communication: Mobility status  Activity Tolerance: Patient tolerated treatment well Patient left: in chair;with call bell/phone within reach;with chair alarm set  OT Visit Diagnosis: Unsteadiness on feet (R26.81);Other abnormalities of gait and mobility (R26.89);Muscle weakness (generalized) (M62.81)                Time: 5885-0277 OT Time Calculation (min): 24 min Charges:  OT General Charges $OT Visit: 1 Visit OT Evaluation $OT Eval Low Complexity: 1 Low OT Treatments $Self Care/Home Management : 8-22 mins  Noell Lorensen MSOT, OTR/L Acute Rehab Office: [place] 02/14/2022, 12:10 PM

## 2022-02-18 ENCOUNTER — Telehealth (HOSPITAL_COMMUNITY): Payer: Self-pay

## 2022-02-18 NOTE — Telephone Encounter (Signed)
Provider portion has been completed and placed in Dr [employer] box for signature.

## 2022-02-18 NOTE — Telephone Encounter (Signed)
Patient assistance for [facility] initiated. Patient portion completed and signed. Faxed to [facility] for provider completion and submission.

## 2022-02-19 NOTE — Telephone Encounter (Signed)
Dr. Johney Frame has signed/dated form and it has been faxed to [facility] Patient Assistance Program @ [phone] and I have received confirmation.

## 2022-02-23 ENCOUNTER — Telehealth: Payer: Self-pay

## 2022-02-23 NOTE — Telephone Encounter (Signed)
PATIENT WAS APPROVED TO GET HIS MEDS THROUGH [facility] PATIENT ASSISTANT PROGRAM FROM [place] [phone] TO DECEMBER 31,2023. PHONE # [phone]

## 2022-02-25 ENCOUNTER — Telehealth: Payer: Self-pay | Admitting: Hematology

## 2022-02-25 NOTE — Telephone Encounter (Signed)
Rescheduled upcoming appointment due to provider's template. Patient is aware of changes. 

## 2022-03-02 ENCOUNTER — Telehealth: Payer: Self-pay | Admitting: Cardiology

## 2022-03-02 NOTE — Telephone Encounter (Signed)
The patient was seen by Ernst Bowler, Clinical Pharmacist at [facility] yesterday. [facility] wanted to make Dr. Johney Frame aware that the patient had a BP of 105/65 and experienced some dizziness. The Pharmacist made some adjustments to the patient's medications.

## 2022-03-09 NOTE — Progress Notes (Unsigned)
Cardiology Office Note:    Date:  03/11/2022   ID:  Henry Nichols, DOB 1932/03/07, MRN [id number]  PCP:  [name], [place] Providers Cardiologist:  Freada Bergeron, MD {   Referring MD: [name], MD    History of Present Illness:    Henry Nichols is a 88 y.o. male with a hx of HLD, HTN, LBBB, mild COPD, non-hodgkin's lymphoma, GERD and Aflutter who presents to clinic for follow-up.  Patient was hospitalized at [facility] from 02/08/22-02/14/22 where he presented with significant volume overload found to have new Aflutter with RVR. TTE 02/09/22 with LVEF 30-35% with severe LBBB-related systolic dyssynchrony, mild MR, moderate TR, mild AI, mild dilation of ascending aorta 28m. He was diuresed with IV lasix with good response and ultimately underwent TEE/DCCV with successful return to NSR. He was discharged on metop 12.'5mg'$  BID, apixaban '5mg'$  BID, jardiance '10mg'$  daily, and lisinopril '5mg'$  daily.  Today, the patient overall feels okay. Reports he has been having some dizzy spells at home. This occurs mainly with position changes or in the hot weather. Has some SOB with exertion while working outside that has been ongoing since the diagnosis of Afib/HF. No chest pain, palpitations, orthopnea, or PND. He did not realize he was in Afib today. He reports his LE is improved but he has pretty significant LE edema on exam today.  Past Medical History:  Diagnosis Date   Coronary artery calcification 08/03/2017   Essential hypertension 08/03/2017   GERD (gastroesophageal reflux disease)    HOH (hard of hearing)    Hyperlipidemia 08/03/2017   Hypertension    LBBB (left bundle branch block) 08/03/2017   Non-Hodgkin lymphoma ([place] 08/03/2017   Pneumonia    hx   Small bowel mass    Varicose vein of leg 08/03/2017    Past Surgical History:  Procedure Laterality Date   CARDIOVERSION N/A 02/12/2022   Procedure: CARDIOVERSION;  Surgeon: AElouise Munroe MD;   [facility] [facility]  Service: Cardiovascular;  Laterality: N/A;   EYE SURGERY Bilateral 2016   cataracts   HERNIA REPAIR Bilateral 2008   inguinal   LAPAROSCOPIC SMALL BOWEL RESECTION  07/06/2017   LAPAROSCOPIC SMALL BOWEL RESECTION N/A 07/06/2017   Procedure: LAPAROSCOPIC ASSISTED SMALL BOWEL RESECTION;  Surgeon: BCoralie Keens MD;  [place] [facility]  Service: General;  Laterality: N/A;   TEE WITHOUT CARDIOVERSION N/A 02/12/2022   Procedure: TRANSESOPHAGEAL ECHOCARDIOGRAM (TEE);  Surgeon: AElouise Munroe MD;  [facility];  Service: Cardiovascular;  Laterality: N/A;    Current Medications: Current Meds  Medication Sig   acetaminophen (TYLENOL) 325 MG tablet Take 2 tablets (650 mg total) by mouth every 6 (six) hours as needed for mild pain or moderate pain.   albuterol (PROVENTIL HFA;VENTOLIN HFA) 108 (90 Base) MCG/ACT inhaler Inhale 1 puff every 6 (six) hours as needed into the lungs for wheezing or shortness of breath.   amiodarone (PACERONE) 200 MG tablet Take 1 tablet (200 mg total) by mouth 2 (two) times daily.   apixaban (ELIQUIS) 5 MG TABS tablet Take 1 tablet (5 mg total) by mouth 2 (two) times daily.   atorvastatin (LIPITOR) 80 MG tablet Take 1 tablet (80 mg total) by mouth daily.   empagliflozin (JARDIANCE) 10 MG TABS tablet Take 1 tablet (10 mg total) by mouth daily.   furosemide (LASIX) 20 MG tablet Take 1 tablet (20 mg total) by mouth daily.   lisinopril (ZESTRIL) 5 MG tablet Take 1 tablet (  5 mg total) by mouth daily.   metoprolol tartrate (LOPRESSOR) 25 MG tablet Take 0.5 tablets (12.5 mg total) by mouth 2 (two) times daily.   Multiple Vitamins-Minerals (PRESERVISION AREDS 2 PO) Take 1 capsule 2 (two) times daily by mouth.   potassium chloride SA (KLOR-CON M) 20 MEQ tablet Take 1 tablet (20 mEq total) by mouth 2 (two) times daily.   triamcinolone cream (KENALOG) 0.1 % Apply 1 application daily as needed topically (for dry skin).     Allergies:   Patient  has no known allergies.   Social History   Socioeconomic History   Marital status: Widowed    Spouse name: Not on file   Number of children: Not on file   Years of education: Not on file   Highest education level: Not on file  Occupational History   Not on file  Tobacco Use   Smoking status: Former    Types: Cigars    Quit date: 06/29/1984    Years since quitting: 37.7   Smokeless tobacco: Never  Vaping Use   Vaping Use: Never used  Substance and Sexual Activity   Alcohol use: No   Drug use: No   Sexual activity: Not on file  Other Topics Concern   Not on file  Social History Narrative   Not on file   Social Determinants of Health   Financial Resource Strain: Not on file  Food Insecurity: Not on file  Transportation Needs: Not on file  Physical Activity: Not on file  Stress: Not on file  Social Connections: Not on file     Family History: The patient's family history includes Alzheimer's disease in his mother; Heart attack in his father; Heart disease in his mother.  ROS:   Please see the history of present illness.     All other systems reviewed and are negative.  EKGs/Labs/Other Studies Reviewed:    The following studies were reviewed today: Echocardiogram 02/09/22   1. There is severe LBBB-related systolic dyssynchrony. Left ventricular  ejection fraction, by estimation, is 30 to 35%. The left ventricle has  moderately decreased function. The left ventricle demonstrates global  hypokinesis. There is mild concentric  left ventricular hypertrophy. Left ventricular diastolic function could  not be evaluated.   2. Right ventricular systolic function is moderately reduced. The right  ventricular size is normal. There is mildly elevated pulmonary artery  systolic pressure. The estimated right ventricular systolic pressure is  63.0 mmHg.   3. Left atrial size was mild to moderately dilated.   4. Right atrial size was moderately dilated.   5. The mitral valve  is normal in structure. Mild mitral valve  regurgitation. No evidence of mitral stenosis.   6. Tricuspid valve regurgitation is moderate.   7. The aortic valve is tricuspid. Aortic valve regurgitation is mild. No  aortic stenosis is present.   8. There is mild dilatation of the ascending aorta, measuring 38 mm.   9. The inferior vena cava is dilated in size with <50% respiratory  variability, suggesting right atrial pressure of 15 mmHg.  EKG:  EKG is  ordered today.  The ekg ordered today demonstrates Aflutter with LBBB with HR 100  Recent Labs: 02/08/2022: ALT 22 02/09/2022: Hemoglobin 11.9; Platelets 274; TSH 3.648 02/11/2022: B Natriuretic Peptide 289.9 02/14/2022: BUN 19; Creatinine, Ser 1.27; Magnesium 2.0; Potassium 3.5; Sodium 136  Recent Lipid Panel    Component Value Date/Time   CHOL 169 02/09/2022 0036   TRIG 67 02/09/2022 0036  HDL 51 02/09/2022 0036   CHOLHDL 3.3 02/09/2022 0036   VLDL 13 02/09/2022 0036   LDLCALC 105 (H) 02/09/2022 0036     Risk Assessment/Calculations:    CHA2DS2-VASc Score = 4   This indicates a 4.8% annual risk of stroke. The patient's score is based upon: CHF History: 1 HTN History: 1 Diabetes History: 0 Stroke History: 0 Vascular Disease History: 0 Age Score: 2 Gender Score: 0        Physical Exam:    VS:  BP 100/60   Pulse 100   Ht '5\' 10"'$  (1.778 m)   Wt 147 lb 9.6 oz (67 kg)   SpO2 92%   BMI 21.18 kg/m     Wt Readings from Last 3 Encounters:  03/11/22 147 lb 9.6 oz (67 kg)  02/14/22 134 lb 4.2 oz (60.9 kg)  10/29/21 155 lb 1.6 oz (70.4 kg)     GEN:  Elderly male, comfortable, hard of hearing HEENT: Normal NECK: JVD mildly elevated, no bruits CARDIAC: Irregular, no murmurs RESPIRATORY:  Clear to auscultation without rales, wheezing or rhonchi  ABDOMEN: Soft, non-tender, non-distended MUSCULOSKELETAL:  2+ pitting edema on the left; 1+ pitting edema on the right SKIN: Warm and dry NEUROLOGIC:  Alert and oriented x  3 PSYCHIATRIC:  Normal affect   ASSESSMENT:    1. Acute on chronic combined systolic and diastolic CHF (congestive heart failure) ([place])   2. Essential hypertension   3. Coronary artery calcification   4. Typical atrial flutter ([place])   5. Primary hypertension   6. Pure hypercholesterolemia   7. LBBB (left bundle branch block)    PLAN:    In order of problems listed above:  #Atrial Flutter with RVR/Afib:  Diagnosed during admission [facility] in 01/2022 where he presented with worsening volume overload and new systolic heart failure in the setting of Aflutter with RVR. Underwent successful DCCV with return to NSR, but has reverted back to Afib on exam today. He is also volume overloaded on exam. Given drop in EF, will try to restore NSR once euvolemic. Plan to start amiodarone and diurese as below. Will arrange for follow-up in Afib clinic for consideration for repeat DCCV +/- initiation of other antiarrhythmics. -- S/p post TEE/DCC 02/12/22 now back in Afib  -- Continue eliquis 5 mg BID  -- Continue metop 12.'5mg'$  BID -- Start amiodarone '200mg'$  BID -- Refer to Afib clinic for consideration for repeat DCCV if remains in Afib +/- other antiarrhythmic   #Acute on Chronic Combined Systolic and Diastolic Heart Failure  Patient with known history of HFpEF who presented to [facility] as detailed above with new Aflutter with RVR found to have drop in EF to 30-35%. Suspect it may be tachy-related CM, however, there is significant septal-lateral dyssynchrony on TTE so may have some element of LBBB-related CM. Currently back in Afib with evidence of volume overload on exam. Will increase diuretics and manage Afib as above. Will plan to repeat TTE to reassess LVEF once maintains NSR.  --Increase lasix to '40mg'$  daily for 7 days or until LE edema resolves and then go back to '20mg'$  daily thereafter --Continue K 69mq BID and adjust pending BMET --Check BMET and BNP today --Continue metop 12.'5mg'$  BID --Continue lisinopril  '5mg'$  daily --Continue jardiance '10mg'$  daily --Once remains in NSR, plan to repeat TTE to reassess LVEF   #Hypertension:  Soft in clinic today with mild dizziness with changing position. Will continue current meds for now but can decrease lisinopril if dizziness worsens/low  BP --Continue metop 12.'5mg'$  BID --Continue lisinopril '5mg'$  daily  #Hyperlipidemia:  -- Continue lipitor '80mg'$  dailuy -- Repeat lipids in 28month for monitoring           Medication Adjustments/Labs and Tests Ordered: Current medicines are reviewed at length with the patient today.  Concerns regarding medicines are outlined above.  Orders Placed This Encounter  Procedures   Basic metabolic panel   Pro b natriuretic peptide (BNP)   Amb Referral to AFIB Clinic   EKG 12-Lead   Meds ordered this encounter  Medications   amiodarone (PACERONE) 200 MG tablet    Sig: Take 1 tablet (200 mg total) by mouth 2 (two) times daily.    Dispense:  180 tablet    Refill:  3    Patient Instructions  Medication Instructions:  Your physician has recommended you make the following change in your medication:  1) INCREASE Lasix (furosemide) to 40 mg daily for 7 days, then decrease back to 20 mg daily 2) START taking amiodarone 200 mg twice daily  *If you need a refill on your cardiac medications before your next appointment, please call your pharmacy*   Lab Work: TODAY: BMET and BNP If you have labs (blood work) drawn today and your tests are completely normal, you will receive your results only by: [place](if you have MyChart) OR A paper copy in the mail If you have any lab test that is abnormal or we need to change your treatment, we will call you to review the results.  Follow-Up: [facility] you and your health needs are our priority.  As part of our continuing mission to provide you with exceptional heart care, we have created designated Provider Care Teams.  These Care Teams include your primary  Cardiologist (physician) and Advanced Practice Providers (APPs -  Physician Assistants and Nurse Practitioners) who all work together to provide you with the care you need, when you need it.  Your next appointment:   3 month(s)  The format for your next appointment:   In Person  Provider:   HFreada Bergeron MD    Other Instructions [facility]  AFIB CLINIC INFORMATION: Your appointment is scheduled on:         at       . Please arrive 15 minutes early for check-in. The AFib Clinic [facility] Parking instructions/directions: EM[address] (off [address]. When you pull in to Entrance C, there is an underground parking garage to your right. The code to enter the garage is 1403. Take the elevators to the first floor. Follow the signs to the Heart and Vascular Specialty Clinics. You will see registration at the end of the hallway.  Phone number: [phone]  Important Information About Sugar         Signed, HFreada Bergeron MD  03/11/2022 12:51 PM    [place]

## 2022-03-11 ENCOUNTER — Encounter: Payer: Self-pay | Admitting: Cardiology

## 2022-03-11 ENCOUNTER — Ambulatory Visit: Payer: Medicare Other | Admitting: Cardiology

## 2022-03-11 VITALS — BP 100/60 | HR 100 | Ht 70.0 in | Wt 147.6 lb

## 2022-03-11 DIAGNOSIS — I2510 Atherosclerotic heart disease of native coronary artery without angina pectoris: Secondary | ICD-10-CM | POA: Diagnosis not present

## 2022-03-11 DIAGNOSIS — I10 Essential (primary) hypertension: Secondary | ICD-10-CM | POA: Diagnosis not present

## 2022-03-11 DIAGNOSIS — I5043 Acute on chronic combined systolic (congestive) and diastolic (congestive) heart failure: Secondary | ICD-10-CM

## 2022-03-11 DIAGNOSIS — I2584 Coronary atherosclerosis due to calcified coronary lesion: Secondary | ICD-10-CM

## 2022-03-11 DIAGNOSIS — I483 Typical atrial flutter: Secondary | ICD-10-CM

## 2022-03-11 DIAGNOSIS — I447 Left bundle-branch block, unspecified: Secondary | ICD-10-CM

## 2022-03-11 DIAGNOSIS — E7800 Pure hypercholesterolemia, unspecified: Secondary | ICD-10-CM

## 2022-03-11 MED ORDER — AMIODARONE HCL 200 MG PO TABS: 200.0000 mg | ORAL_TABLET | Freq: Two times a day (BID) | ORAL | 3 refills | Status: DC

## 2022-03-11 NOTE — Patient Instructions (Addendum)
Medication Instructions:  Your physician has recommended you make the following change in your medication:  1) INCREASE Lasix (furosemide) to 40 mg daily for 7 days, then decrease back to 20 mg daily 2) START taking amiodarone 200 mg twice daily  *If you need a refill on your cardiac medications before your next appointment, please call your pharmacy*   Lab Work: TODAY: BMET and BNP If you have labs (blood work) drawn today and your tests are completely normal, you will receive your results only by: [place] (if you have MyChart) OR A paper copy in the mail If you have any lab test that is abnormal or we need to change your treatment, we will call you to review the results.  Follow-Up: At [facility], you and your health needs are our priority.  As part of our continuing mission to provide you with exceptional heart care, we have created designated Provider Care Teams.  These Care Teams include your primary Cardiologist (physician) and Advanced Practice Providers (APPs -  Physician Assistants and Nurse Practitioners) who all work together to provide you with the care you need, when you need it.  Your next appointment:   3 month(s)  The format for your next appointment:   In Person  Provider:   Freada Bergeron, MD    Other Instructions You have been referred to the [facility] to be seen in approximately 2 weeks  AFIB CLINIC INFORMATION: Your appointment is scheduled on:         at       . Please arrive 15 minutes early for check-in. The AFib Clinic is located in the [facility] Specialty Clinics at [facility]. Parking instructions/directions: Midwife C (off [employer]). When you pull in to Entrance C, there is an underground parking garage to your right. The code to enter the garage is 1403. Take the elevators to the first floor. Follow the signs to the Heart and Vascular Specialty Clinics. You will see registration at the end of the  hallway.  Phone number: [place]

## 2022-03-12 LAB — BASIC METABOLIC PANEL WITH GFR
BUN/Creatinine Ratio: 12 (ref 10–24)
BUN: 19 mg/dL (ref 8–27)
CO2: 23 mmol/L (ref 20–29)
Calcium: 9 mg/dL (ref 8.6–10.2)
Chloride: 102 mmol/L (ref 96–106)
Creatinine, Ser: 1.6 mg/dL — ABNORMAL HIGH (ref 0.76–1.27)
Glucose: 95 mg/dL (ref 70–99)
Potassium: 4.3 mmol/L (ref 3.5–5.2)
Sodium: 139 mmol/L (ref 134–144)
eGFR: 41 mL/min/1.73 — ABNORMAL LOW

## 2022-03-12 LAB — PRO B NATRIURETIC PEPTIDE: NT-Pro BNP: 4414 pg/mL — ABNORMAL HIGH (ref 0–486)

## 2022-03-17 ENCOUNTER — Other Ambulatory Visit: Payer: Self-pay

## 2022-03-17 MED ORDER — ATORVASTATIN CALCIUM 80 MG PO TABS: 80.0000 mg | ORAL_TABLET | Freq: Every day | ORAL | 3 refills | Status: DC

## 2022-03-17 MED ORDER — METOPROLOL TARTRATE 25 MG PO TABS: 12.5000 mg | ORAL_TABLET | Freq: Two times a day (BID) | ORAL | 3 refills | Status: DC

## 2022-03-17 MED ORDER — LISINOPRIL 5 MG PO TABS: 5.0000 mg | ORAL_TABLET | Freq: Every day | ORAL | 3 refills | Status: DC

## 2022-03-17 MED ORDER — POTASSIUM CHLORIDE CRYS ER 20 MEQ PO TBCR: 20.0000 meq | EXTENDED_RELEASE_TABLET | Freq: Two times a day (BID) | ORAL | 1 refills | Status: DC

## 2022-03-17 MED ORDER — APIXABAN 5 MG PO TABS
5.0000 mg | ORAL_TABLET | Freq: Two times a day (BID) | ORAL | 0 refills | Status: DC
Start: 2022-03-17 — End: 2022-05-02

## 2022-03-17 MED ORDER — FUROSEMIDE 20 MG PO TABS: 20.0000 mg | ORAL_TABLET | Freq: Every day | ORAL | 3 refills | Status: DC

## 2022-03-17 NOTE — Telephone Encounter (Signed)
Prescription refill request for Eliquis received. Indication: Atrial Flutter Last office visit: 03/11/22  Shary Key MD Scr: 1.60 on 03/11/22 Age: 88 Weight: 67kg  Patient is on Eliquis '5mg'$  twice daily. Based on above findings Eliquis 2.'5mg'$  daily is the appropriate dose (age, SCr) but SCr has fluctuated up and down over the past year.  Message sent to Dr Johney Frame to make decision on preferred dose.  Will hold off on refill until dose is decided.

## 2022-03-17 NOTE — Telephone Encounter (Signed)
Eliquis '5mg'$  twice daily was approved and sent in by Dr Johney Frame.

## 2022-03-22 ENCOUNTER — Ambulatory Visit (HOSPITAL_COMMUNITY)
Admission: RE | Admit: 2022-03-22 | Discharge: 2022-03-22 | Disposition: A | Discharge status: Home / Self Care | Payer: Medicare Other | Source: Ambulatory Visit | Attending: Physician Assistant | Admitting: Physician Assistant

## 2022-03-22 VITALS — BP 124/60 | HR 53 | Ht 70.0 in | Wt 142.4 lb

## 2022-03-22 DIAGNOSIS — Z7901 Long term (current) use of anticoagulants: Secondary | ICD-10-CM | POA: Insufficient documentation

## 2022-03-22 DIAGNOSIS — I4819 Other persistent atrial fibrillation: Secondary | ICD-10-CM | POA: Diagnosis present

## 2022-03-22 DIAGNOSIS — I5042 Chronic combined systolic (congestive) and diastolic (congestive) heart failure: Secondary | ICD-10-CM | POA: Insufficient documentation

## 2022-03-22 DIAGNOSIS — Z79899 Other long term (current) drug therapy: Secondary | ICD-10-CM | POA: Insufficient documentation

## 2022-03-22 DIAGNOSIS — D6869 Other thrombophilia: Secondary | ICD-10-CM | POA: Diagnosis not present

## 2022-03-22 DIAGNOSIS — I4892 Unspecified atrial flutter: Secondary | ICD-10-CM | POA: Insufficient documentation

## 2022-03-22 DIAGNOSIS — J449 Chronic obstructive pulmonary disease, unspecified: Secondary | ICD-10-CM | POA: Diagnosis not present

## 2022-03-22 DIAGNOSIS — E785 Hyperlipidemia, unspecified: Secondary | ICD-10-CM | POA: Insufficient documentation

## 2022-03-22 DIAGNOSIS — I110 Hypertensive heart disease with heart failure: Secondary | ICD-10-CM | POA: Diagnosis not present

## 2022-03-22 LAB — BASIC METABOLIC PANEL
Anion gap: 14 (ref 5–15)
BUN: 25 mg/dL — ABNORMAL HIGH (ref 8–23)
CO2: 28 mmol/L (ref 22–32)
Calcium: 9.3 mg/dL (ref 8.9–10.3)
Chloride: 98 mmol/L (ref 98–111)
Creatinine, Ser: 1.94 mg/dL — ABNORMAL HIGH (ref 0.61–1.24)
GFR, Estimated: 33 mL/min — ABNORMAL LOW (ref >60)
Glucose, Bld: 93 mg/dL (ref 70–99)
Potassium: 4 mmol/L (ref 3.5–5.1)
Sodium: 140 mmol/L (ref 135–145)

## 2022-03-22 MED ORDER — AMIODARONE HCL 200 MG PO TABS: 200.0000 mg | ORAL_TABLET | Freq: Every day | ORAL | 1 refills | Status: DC

## 2022-03-22 NOTE — Progress Notes (Signed)
Primary Care Physician: [name], MD Primary Cardiologist: Dr Johney Frame Primary Electrophysiologist: none Referring Physician: Dr Odessa Fleming is a 88 y.o. male with a history of HLD, HTN, COPD, non-hodgkins lymphoma, systolic CHF, atrial flutter, atrial fibrillation who presents for consultation in the [facility].  The patient was hospitalized at [facility] from 02/08/22-02/14/22 where he presented with significant volume overload found to have new Aflutter with RVR. TTE 02/09/22 with LVEF 30-35% with severe LBBB-related systolic dyssynchrony, mild MR, moderate TR, mild AI, mild dilation of ascending aorta 85m. He was diuresed with IV lasix with good response and ultimately underwent TEE/DCCV with successful return to NSR. Patient is on Eliquis for a CHADS2VASC score of 4. He was seen by Dr PJohney Framein follow up 03/11/22 and was found to be in afib. He was started on amiodarone at that time. Today, patient has chemically converted back to SR. His weight is down since his last visit with Dr PJohney Frame He answers questions appropriately but becomes confused at times. He has family with him today.   Today, he denies symptoms of palpitations, chest pain, shortness of breath, orthopnea, PND, lower extremity edema, dizziness, presyncope, syncope, snoring, daytime somnolence, bleeding, or neurologic sequela. The patient is tolerating medications without difficulties and is otherwise without complaint today.    Atrial Fibrillation Risk Factors:  he does not have symptoms or diagnosis of sleep apnea. he does not have a history of rheumatic fever.   he has a BMI of Body mass index is 20.43 kg/m..Marland KitchenFiled Weights   03/22/22 1002  Weight: 64.6 kg    Family History  Problem Relation Age of Onset   Alzheimer's disease Mother    Heart disease Mother    Heart attack Father      Atrial Fibrillation Management history:  Previous antiarrhythmic drugs:  amiodarone  Previous cardioversions: 02/12/22 Previous ablations: none CHADS2VASC score: 4 Anticoagulation history: Eliquis   Past Medical History:  Diagnosis Date   Coronary artery calcification 08/03/2017   Essential hypertension 08/03/2017   GERD (gastroesophageal reflux disease)    HOH (hard of hearing)    Hyperlipidemia 08/03/2017   Hypertension    LBBB (left bundle branch block) 08/03/2017   Non-Hodgkin lymphoma (HTerminous 08/03/2017   Pneumonia    hx   Small bowel mass    Varicose vein of leg 08/03/2017   Past Surgical History:  Procedure Laterality Date   CARDIOVERSION N/A 02/12/2022   Procedure: CARDIOVERSION;  Surgeon: AElouise Munroe MD;  [facility]  Service: Cardiovascular;  Laterality: N/A;   EYE SURGERY Bilateral 2016   cataracts   HERNIA REPAIR Bilateral 2008   inguinal   LAPAROSCOPIC SMALL BOWEL RESECTION  07/06/2017   LAPAROSCOPIC SMALL BOWEL RESECTION N/A 07/06/2017   Procedure: LAPAROSCOPIC ASSISTED SMALL BOWEL RESECTION;  Surgeon: BCoralie Keens MD;  [place] [facility]  Service: General;  Laterality: N/A;   TEE WITHOUT CARDIOVERSION N/A 02/12/2022   Procedure: TRANSESOPHAGEAL ECHOCARDIOGRAM (TEE);  Surgeon: AElouise Munroe MD;  [facility];  Service: Cardiovascular;  Laterality: N/A;    Current Outpatient Medications  Medication Sig Dispense Refill   acetaminophen (TYLENOL) 325 MG tablet Take 2 tablets (650 mg total) by mouth every 6 (six) hours as needed for mild pain or moderate pain.     albuterol (PROVENTIL HFA;VENTOLIN HFA) 108 (90 Base) MCG/ACT inhaler Inhale 1 puff every 6 (six) hours as needed into the lungs for wheezing or shortness of breath.  amiodarone (PACERONE) 200 MG tablet Take 1 tablet (200 mg total) by mouth 2 (two) times daily. 180 tablet 3   apixaban (ELIQUIS) 5 MG TABS tablet Take 1 tablet (5 mg total) by mouth 2 (two) times daily. 60 tablet 0   atorvastatin (LIPITOR) 80 MG tablet Take 1 tablet (80 mg  total) by mouth daily. 90 tablet 3   furosemide (LASIX) 20 MG tablet Take 1 tablet (20 mg total) by mouth daily. 90 tablet 3   lisinopril (ZESTRIL) 5 MG tablet Take 1 tablet (5 mg total) by mouth daily. 90 tablet 3   metoprolol tartrate (LOPRESSOR) 25 MG tablet Take 0.5 tablets (12.5 mg total) by mouth 2 (two) times daily. 180 tablet 3   Multiple Vitamins-Minerals (PRESERVISION AREDS 2 PO) Take 1 capsule by mouth at bedtime.     potassium chloride SA (KLOR-CON M) 20 MEQ tablet Take 1 tablet (20 mEq total) by mouth 2 (two) times daily. 180 tablet 1   triamcinolone cream (KENALOG) 0.1 % Apply 1 application daily as needed topically (for dry skin).     No current facility-administered medications for this encounter.    No Known Allergies  Social History   Socioeconomic History   Marital status: Widowed    Spouse name: Not on file   Number of children: Not on file   Years of education: Not on file   Highest education level: Not on file  Occupational History   Not on file  Tobacco Use   Smoking status: Former    Types: Cigars    Quit date: 06/29/1984    Years since quitting: 37.7   Smokeless tobacco: Never  Vaping Use   Vaping Use: Never used  Substance and Sexual Activity   Alcohol use: No   Drug use: No   Sexual activity: Not on file  Other Topics Concern   Not on file  Social History Narrative   Not on file   Social Determinants of Health   Financial Resource Strain: Not on file  Food Insecurity: Not on file  Transportation Needs: Not on file  Physical Activity: Not on file  Stress: Not on file  Social Connections: Not on file  Intimate Partner Violence: Not on file     ROS- All systems are reviewed and negative except as per the HPI above.  Physical Exam: Vitals:   03/22/22 1002  BP: 124/60  Pulse: (!) 53  Weight: 64.6 kg  Height: '5\' 10"'$  (1.778 m)    GEN- The patient is a well appearing elderly male, alert and oriented x 3 today.   Head- normocephalic,  atraumatic Eyes-  Sclera clear, conjunctiva pink Ears- hearing intact Oropharynx- clear Neck- supple  Lungs- Clear to ausculation bilaterally, normal work of breathing Heart- Regular rate and rhythm, no murmurs, rubs or gallops  GI- soft, NT, ND, + BS Extremities- no clubbing, cyanosis, or edema MS- no significant deformity or atrophy Skin- no rash or lesion Psych- euthymic mood, full affect Neuro- strength and sensation are intact  Wt Readings from Last 3 Encounters:  03/22/22 64.6 kg  03/11/22 67 kg  02/14/22 60.9 kg    EKG today demonstrates  SB, 1st degree AV block, LBBB, PVC Vent. rate 53 BPM PR interval 234 ms QRS duration 160 ms QT/QTcB 538/504 ms  Echo 02/09/22 demonstrated   1. There is severe LBBB-related systolic dyssynchrony. Left ventricular  ejection fraction, by estimation, is 30 to 35%. The left ventricle has  moderately decreased function. The left ventricle demonstrates  global  hypokinesis. There is mild concentric left ventricular hypertrophy. Left ventricular diastolic function could not be evaluated.   2. Right ventricular systolic function is moderately reduced. The right  ventricular size is normal. There is mildly elevated pulmonary artery  systolic pressure. The estimated right ventricular systolic pressure is  70.4 mmHg.   3. Left atrial size was mild to moderately dilated.   4. Right atrial size was moderately dilated.   5. The mitral valve is normal in structure. Mild mitral valve  regurgitation. No evidence of mitral stenosis.   6. Tricuspid valve regurgitation is moderate.   7. The aortic valve is tricuspid. Aortic valve regurgitation is mild. No  aortic stenosis is present.   8. There is mild dilatation of the ascending aorta, measuring 38 mm.   9. The inferior vena cava is dilated in size with <50% respiratory  variability, suggesting right atrial pressure of 15 mmHg.   Epic records are reviewed at length today  CHA2DS2-VASc Score = 4   The patient's score is based upon: CHF History: 1 HTN History: 1 Diabetes History: 0 Stroke History: 0 Vascular Disease History: 0 Age Score: 2 Gender Score: 0       ASSESSMENT AND PLAN: 1. Persistent Atrial Fibrillation/atrial flutter The patient's CHA2DS2-VASc score is 4, indicating a 4.8% annual risk of stroke.   General education about afib provided and questions answered. Patient has chemically converted with amiodarone, will not pursue DCCV. Continue amiodarone 200 mg BID x2 weeks, then decrease to once daily. Would recheck TSH/LFTs at follow up. Continue Lopressor 12.5 mg BID Continue Eliquis 5 mg BID  2. Secondary Hypercoagulable State (ICD10:  D68.69) The patient is at significant risk for stroke/thromboembolism based upon his CHA2DS2-VASc Score of 4.  Continue Apixaban (Eliquis).   3. Chronic combined systolic and diastolic CHF His weight is down 6-7 lbs since his last visit. Check bmet today.  4. HTN Stable, no changes today.   Follow up with Dr Johney Frame as scheduled. AF clinic in 6 months.    [facility] [address] [place], [place] [address] [phone] 03/22/2022 10:35 AM

## 2022-03-22 NOTE — Patient Instructions (Signed)
August 21st =--- reduce Amiodarone to '200mg'$  once a day

## 2022-03-30 ENCOUNTER — Telehealth: Payer: Self-pay | Admitting: *Deleted

## 2022-03-30 DIAGNOSIS — I10 Essential (primary) hypertension: Secondary | ICD-10-CM

## 2022-03-30 DIAGNOSIS — Z79899 Other long term (current) drug therapy: Secondary | ICD-10-CM

## 2022-03-30 DIAGNOSIS — R7989 Other specified abnormal findings of blood chemistry: Secondary | ICD-10-CM

## 2022-03-30 NOTE — Telephone Encounter (Signed)
Spoke with the pt and daughter in law about lab recommendations per Dr. Johney Frame.  Daughter in law agreed to bring the pt in for repeat bmet on next Monday 8/21. Lab appt to recheck BMET is scheduled for next Monday 8/21. Both parties verbalized understanding and agrees with this plan.

## 2022-03-30 NOTE — Telephone Encounter (Signed)
-----   Message from Freada Bergeron, MD sent at 03/29/2022 12:37 PM EDT ----- Agree with you.   Karlene Einstein, can we get repeat BMET in 1 week to ensure Cr is better? ----- Message ----- From: Oliver Barre, PA Sent: 03/22/2022   1:31 PM EDT To: Juluis Mire, RN; Freada Bergeron, MD  Cr trending up 1.27 > 1.60 > 1.94 today. Would decrease Lasix back to once daily dosing and not use extra doses for now.  Will forward copy of labs to his primary cardiologist. Lisinopril and empagliflozin started at discharge.

## 2022-03-31 ENCOUNTER — Ambulatory Visit (HOSPITAL_COMMUNITY)
Admission: RE | Admit: 2022-03-31 | Discharge: 2022-03-31 | Disposition: A | Discharge status: Home / Self Care | Payer: Medicare Other | Source: Ambulatory Visit | Attending: Hematology | Admitting: Hematology

## 2022-03-31 DIAGNOSIS — C8233 Follicular lymphoma grade IIIa, intra-abdominal lymph nodes: Secondary | ICD-10-CM | POA: Insufficient documentation

## 2022-03-31 MED ORDER — IOHEXOL 300 MG/ML  SOLN
80.0000 mL | Freq: Once | INTRAMUSCULAR | Status: AC | PRN
  Administered 2022-03-31: 80 mL via INTRAVENOUS

## 2022-03-31 MED ORDER — SODIUM CHLORIDE (PF) 0.9 % IJ SOLN
INTRAMUSCULAR | Status: AC
  Filled 2022-03-31: qty 50

## 2022-03-31 MED ORDER — IOHEXOL 9 MG/ML PO SOLN
1000.0000 mL | Freq: Once | ORAL | Status: AC
Start: 2022-03-31 — End: 2022-03-31
  Administered 2022-03-31: 1000 mL via ORAL

## 2022-04-05 ENCOUNTER — Other Ambulatory Visit: Payer: Medicare Other

## 2022-04-05 DIAGNOSIS — R7989 Other specified abnormal findings of blood chemistry: Secondary | ICD-10-CM

## 2022-04-05 DIAGNOSIS — Z79899 Other long term (current) drug therapy: Secondary | ICD-10-CM

## 2022-04-05 DIAGNOSIS — I10 Essential (primary) hypertension: Secondary | ICD-10-CM

## 2022-04-06 ENCOUNTER — Telehealth: Payer: Self-pay | Admitting: *Deleted

## 2022-04-06 DIAGNOSIS — E875 Hyperkalemia: Secondary | ICD-10-CM

## 2022-04-06 DIAGNOSIS — Z79899 Other long term (current) drug therapy: Secondary | ICD-10-CM

## 2022-04-06 DIAGNOSIS — R7989 Other specified abnormal findings of blood chemistry: Secondary | ICD-10-CM

## 2022-04-06 LAB — BASIC METABOLIC PANEL
BUN/Creatinine Ratio: 10 (ref 10–24)
BUN: 33 mg/dL — ABNORMAL HIGH (ref 8–27)
CO2: 22 mmol/L (ref 20–29)
Calcium: 9.3 mg/dL (ref 8.6–10.2)
Chloride: 102 mmol/L (ref 96–106)
Creatinine, Ser: 3.21 mg/dL — ABNORMAL HIGH (ref 0.76–1.27)
Glucose: 102 mg/dL — ABNORMAL HIGH (ref 70–99)
Potassium: 5.3 mmol/L — ABNORMAL HIGH (ref 3.5–5.2)
Sodium: 139 mmol/L (ref 134–144)
eGFR: 18 mL/min/{1.73_m2} — ABNORMAL LOW (ref >59)

## 2022-04-06 NOTE — Telephone Encounter (Signed)
-----   Message from Werner Lean, MD sent at 04/06/2022  8:05 AM EDT ----- Results: Worsening creatinine despite decrease in diuretics Plan: Hold ACEi, Lasix, and Potassium, BMP in one week.  Werner Lean, MD

## 2022-04-06 NOTE — Telephone Encounter (Signed)
The patients daughter in law Debbie (handles pts medical appts/results and on DPR) has been notified of the result and verbalized understanding.  All questions (if any) were answered.  Henry Nichols is aware that the pt will need to hold his lisinopril, lasix, and KDUR for one week and then come in for repeat BMET at that time, to reassess his kidney function and electrolytes.   Henry Nichols is aware that when he comes in for repeat bmet in one week, we will then advise on further dosing of these meds, when the results come back.   Scheduled the pt a lab appt to recheck a BMET in one week, on 04/13/22.  Debbie verbalized understanding and agrees with this plan.

## 2022-04-13 ENCOUNTER — Telehealth: Payer: Self-pay

## 2022-04-13 ENCOUNTER — Ambulatory Visit: Payer: Medicare Other | Attending: Cardiology

## 2022-04-13 DIAGNOSIS — E875 Hyperkalemia: Secondary | ICD-10-CM

## 2022-04-13 DIAGNOSIS — Z79899 Other long term (current) drug therapy: Secondary | ICD-10-CM

## 2022-04-13 DIAGNOSIS — R7989 Other specified abnormal findings of blood chemistry: Secondary | ICD-10-CM

## 2022-04-13 LAB — BASIC METABOLIC PANEL
BUN/Creatinine Ratio: 12 (ref 10–24)
BUN: 23 mg/dL (ref 8–27)
CO2: 24 mmol/L (ref 20–29)
Calcium: 9.5 mg/dL (ref 8.6–10.2)
Chloride: 105 mmol/L (ref 96–106)
Creatinine, Ser: 1.85 mg/dL — ABNORMAL HIGH (ref 0.76–1.27)
Glucose: 107 mg/dL — ABNORMAL HIGH (ref 70–99)
Potassium: 4 mmol/L (ref 3.5–5.2)
Sodium: 143 mmol/L (ref 134–144)
eGFR: 35 mL/min/{1.73_m2} — ABNORMAL LOW (ref >59)

## 2022-04-13 NOTE — Telephone Encounter (Signed)
Is having labs drawn again today. Would wait on refilling until results come back.

## 2022-04-13 NOTE — Telephone Encounter (Signed)
Prescription refill request for Eliquis received. Indication:Aflutter Last office visit:7/23 Scr:3.2 Age: 88 Weight:64.6 kg  Under review for Eliquis dose reduction

## 2022-04-15 ENCOUNTER — Emergency Department (HOSPITAL_COMMUNITY)
Admission: EM | Admit: 2022-04-15 | Discharge: 2022-04-15 | Disposition: A | Discharge status: Home / Self Care | Payer: Medicare Other | Attending: Emergency Medicine | Admitting: Emergency Medicine

## 2022-04-15 ENCOUNTER — Other Ambulatory Visit: Payer: Medicare Other

## 2022-04-15 ENCOUNTER — Emergency Department (HOSPITAL_COMMUNITY): Payer: Medicare Other

## 2022-04-15 ENCOUNTER — Encounter (HOSPITAL_COMMUNITY): Payer: Self-pay | Admitting: Radiology

## 2022-04-15 ENCOUNTER — Ambulatory Visit: Payer: Medicare Other | Admitting: Hematology

## 2022-04-15 DIAGNOSIS — I4891 Unspecified atrial fibrillation: Secondary | ICD-10-CM | POA: Diagnosis not present

## 2022-04-15 DIAGNOSIS — Y92009 Unspecified place in unspecified non-institutional (private) residence as the place of occurrence of the external cause: Secondary | ICD-10-CM | POA: Insufficient documentation

## 2022-04-15 DIAGNOSIS — W108XXA Fall (on) (from) other stairs and steps, initial encounter: Secondary | ICD-10-CM | POA: Diagnosis not present

## 2022-04-15 DIAGNOSIS — S0990XA Unspecified injury of head, initial encounter: Secondary | ICD-10-CM | POA: Insufficient documentation

## 2022-04-15 DIAGNOSIS — W19XXXA Unspecified fall, initial encounter: Secondary | ICD-10-CM

## 2022-04-15 DIAGNOSIS — Z7901 Long term (current) use of anticoagulants: Secondary | ICD-10-CM | POA: Insufficient documentation

## 2022-04-15 DIAGNOSIS — S2232XA Fracture of one rib, left side, initial encounter for closed fracture: Secondary | ICD-10-CM | POA: Insufficient documentation

## 2022-04-15 DIAGNOSIS — S299XXA Unspecified injury of thorax, initial encounter: Secondary | ICD-10-CM | POA: Diagnosis present

## 2022-04-15 DIAGNOSIS — Y9301 Activity, walking, marching and hiking: Secondary | ICD-10-CM | POA: Diagnosis not present

## 2022-04-15 NOTE — ED Triage Notes (Signed)
Pt states he has left rib pain from a fall that he had today. Mechanical fall. Pt tripped going up the steps into his house. Pt denies hitting his head. Pt denies hip pain to this nurse.

## 2022-04-15 NOTE — ED Provider Triage Note (Signed)
Emergency Medicine Provider Triage Evaluation Note  Henry Nichols , a 88 y.o. male  was evaluated in triage.  Pt complains of fall. States he was walking up some steps when he tripped and fell onto his left side. States that a stair hit him on the left ribs where he is having pain. He denies hitting his head or loss of consciousness but is anticoagulated. Patient seems somewhat forgetful on exam..  Review of Systems  Positive:  Negative:   Physical Exam  BP (!) 113/57   Pulse (!) 56   Temp (!) 97.4 F (36.3 C) (Oral)   Resp 15   SpO2 98%  Gen:   Awake, no distress   Resp:  Normal effort  MSK:   Moves extremities without difficulty  Other:  No C-spine tenderness. TTP of the left lateral ribs.   Medical Decision Making  Medically screening exam initiated at 6:41 PM.  Appropriate orders placed.  Henry Nichols was informed that the remainder of the evaluation will be completed by another provider, this initial triage assessment does not replace that evaluation, and the importance of remaining in the ED until their evaluation is complete.     Mickie Hillier, PA-C 04/15/22 1842

## 2022-04-15 NOTE — Discharge Instructions (Signed)
You were seen in the emergency department for some right-sided rib pain after a fall.  Your x-ray showed a possible rib fracture.  You also had a CAT scan of your head and neck that did not show any acute findings.  You can use Tylenol for pain and ice to the affected area.  Follow-up with your regular doctor.  Return to the emergency department if any worsening or concerning symptoms.

## 2022-04-15 NOTE — ED Triage Notes (Signed)
Pt oriented to self, month and situation.

## 2022-04-15 NOTE — ED Provider Notes (Signed)
[facility] DEPT Provider Note   CSN: [id number] Arrival date & time: 04/15/22  1746     History  Chief Complaint  Patient presents with   Henry Nichols is a 88 y.o. male.  He is here for evaluation of injuries from a fall.  He said he was walking up the steps and he thinks he might of tripped struck his left ribs on the handrail.  He denies head injury neck injury.  No loss of consciousness.  He denies any other complaints other than some pain in his left lateral ribs.  This occurred a few hours ago.  He is on Eliquis for his A-fib  The history is provided by the patient.  Fall This is a new problem. The current episode started 3 to 5 hours ago. The problem has not changed since onset.Associated symptoms include chest pain. Pertinent negatives include no abdominal pain, no headaches and no shortness of breath. The symptoms are aggravated by bending, twisting and coughing. Nothing relieves the symptoms. He has tried nothing for the symptoms. The treatment provided no relief.       Home Medications Prior to Admission medications   Medication Sig Start Date End Date Taking? Authorizing Provider  acetaminophen (TYLENOL) 325 MG tablet Take 2 tablets (650 mg total) by mouth every 6 (six) hours as needed for mild pain or moderate pain. 07/09/17   Jill Alexanders, PA-C  albuterol (PROVENTIL HFA;VENTOLIN HFA) 108 (90 Base) MCG/ACT inhaler Inhale 1 puff every 6 (six) hours as needed into the lungs for wheezing or shortness of breath.    [provider]  amiodarone (PACERONE) 200 MG tablet Take 1 tablet (200 mg total) by mouth daily. 03/22/22   Fenton, Clint R, PA  apixaban (ELIQUIS) 5 MG TABS tablet Take 1 tablet (5 mg total) by mouth 2 (two) times daily. 03/17/22 04/16/22  Freada Bergeron, MD  atorvastatin (LIPITOR) 80 MG tablet Take 1 tablet (80 mg total) by mouth daily. 03/17/22   Freada Bergeron, MD  furosemide (LASIX) 20 MG tablet  Take 1 tablet (20 mg total) by mouth daily. 03/17/22   Freada Bergeron, MD  lisinopril (ZESTRIL) 5 MG tablet Take 1 tablet (5 mg total) by mouth daily. 03/17/22   Freada Bergeron, MD  metoprolol tartrate (LOPRESSOR) 25 MG tablet Take 0.5 tablets (12.5 mg total) by mouth 2 (two) times daily. 03/17/22   Freada Bergeron, MD  Multiple Vitamins-Minerals (PRESERVISION AREDS 2 PO) Take 1 capsule by mouth at bedtime.    [provider]  potassium chloride SA (KLOR-CON M) 20 MEQ tablet Take 1 tablet (20 mEq total) by mouth 2 (two) times daily. 03/17/22   Freada Bergeron, MD  triamcinolone cream (KENALOG) 0.1 % Apply 1 application daily as needed topically (for dry skin).    [provider]      Allergies    Patient has no known allergies.    Review of Systems   Review of Systems  Constitutional:  Negative for fever.  HENT:  Negative for sore throat.   Eyes:  Negative for visual disturbance.  Respiratory:  Negative for shortness of breath.   Cardiovascular:  Positive for chest pain.  Gastrointestinal:  Negative for abdominal pain.  Genitourinary:  Negative for dysuria.  Musculoskeletal:  Negative for back pain and neck pain.  Skin:  Negative for rash.  Neurological:  Negative for headaches.    Physical Exam Updated Vital Signs BP Marland Kitchen)  113/57   Pulse (!) 56   Temp (!) 97.4 F (36.3 C) (Oral)   Resp 15   SpO2 98%  Physical Exam Vitals and nursing note reviewed.  Constitutional:      General: He is not in acute distress.    Appearance: Normal appearance. He is well-developed.  HENT:     Head: Normocephalic and atraumatic.  Eyes:     Conjunctiva/sclera: Conjunctivae normal.  Cardiovascular:     Rate and Rhythm: Normal rate and regular rhythm.     Heart sounds: No murmur heard. Pulmonary:     Effort: Pulmonary effort is normal. No respiratory distress.     Breath sounds: Normal breath sounds.  Abdominal:     Palpations: Abdomen is soft.     Tenderness:  There is no abdominal tenderness. There is no guarding or rebound.  Musculoskeletal:        General: Normal range of motion.     Cervical back: Neck supple.     Right lower leg: No edema.     Left lower leg: No edema.  Skin:    General: Skin is warm and dry.     Capillary Refill: Capillary refill takes less than 2 seconds.  Neurological:     General: No focal deficit present.     Mental Status: He is alert.     Sensory: No sensory deficit.     Motor: No weakness.     Gait: Gait normal.     ED Results / Procedures / Treatments   Labs (all labs ordered are listed, but only abnormal results are displayed) Labs Reviewed - No data to display  EKG None  Radiology CT Cervical Spine Wo Contrast  Result Date: 04/15/2022 CLINICAL DATA:  Neck trauma. EXAM: CT CERVICAL SPINE WITHOUT CONTRAST TECHNIQUE: Multidetector CT imaging of the cervical spine was performed without intravenous contrast. Multiplanar CT image reconstructions were also generated. RADIATION DOSE REDUCTION: This exam was performed according to the departmental dose-optimization program which includes automated exposure control, adjustment of the mA and/or kV according to patient size and/or use of iterative reconstruction technique. COMPARISON:  None Available. FINDINGS: Alignment: There is 2 mm of retrolisthesis at C3-C4 which is favored is degenerative. Alignment is otherwise anatomic. Skull base and vertebrae: No acute fracture. No primary bone lesion or focal pathologic process. Soft tissues and spinal canal: No prevertebral fluid or swelling. No visible canal hematoma. Disc levels: Preserved. Minimal degenerative endplate changes are seen at C6-C7. Upper chest: Clear. Other: None. IMPRESSION: 1. No acute fracture or traumatic subluxation. 2. Minimal degenerative changes. Electronically Signed   By: Ronney Asters M.D.   On: 04/15/2022 19:36   CT Head Wo Contrast  Result Date: 04/15/2022 CLINICAL DATA:  Status post fall.  EXAM: CT HEAD WITHOUT CONTRAST TECHNIQUE: Contiguous axial images were obtained from the base of the skull through the vertex without intravenous contrast. RADIATION DOSE REDUCTION: This exam was performed according to the departmental dose-optimization program which includes automated exposure control, adjustment of the mA and/or kV according to patient size and/or use of iterative reconstruction technique. COMPARISON:  None Available. FINDINGS: Brain: There is mild to moderate severity cerebral atrophy with widening of the extra-axial spaces and ventricular dilatation. There are areas of decreased attenuation within the white matter tracts of the supratentorial brain, consistent with microvascular disease changes. Vascular: No hyperdense vessel or unexpected calcification. Skull: Normal. Negative for fracture or focal lesion. Sinuses/Orbits: There is mild bilateral maxillary sinus and marked severity bilateral ethmoid sinus  mucosal thickening. Other: None. IMPRESSION: 1. No acute intracranial abnormality. 2. Generalized cerebral atrophy and microvascular disease changes of the supratentorial brain. 3. Mild bilateral maxillary sinus and marked severity bilateral ethmoid sinus disease. Electronically Signed   By: Virgina Norfolk M.D.   On: 04/15/2022 19:28   DG Ribs Unilateral W/Chest Left  Result Date: 04/15/2022 CLINICAL DATA:  Fall.  Left rib pain. EXAM: LEFT RIBS AND CHEST - 3+ VIEW COMPARISON:  Chest x-ray 02/11/2022 FINDINGS: There is a nondisplaced anterior left seventh rib fracture, age indeterminate. There is no pneumothorax. The cardiomediastinal silhouette is stable. There is a small right pleural effusion which has decreased from prior. IMPRESSION: 1. Age indeterminate nondisplaced anterior left seventh rib fracture. 2. No pneumothorax. 3. Small right pleural effusion has decreased from prior. Electronically Signed   By: Ronney Asters M.D.   On: 04/15/2022 19:13    Procedures Procedures     Medications Ordered in ED Medications - No data to display  ED Course/ Medical Decision Making/ A&P Clinical Course as of 04/16/22 0912  Thu Apr 15, 2022  1933 Chest x-ray does not show any significant pneumothorax.  Awaiting radiology reading. [MB]    Clinical Course User Index [MB] Hayden Rasmussen, MD                           Medical Decision Making  This patient complains of left-sided rib pain after fall; this involves an extensive number of treatment Options and is a complaint that carries with it a high risk of complications and morbidity. The differential includes contusion, fracture, pneumothorax, hematoma I ordered imaging studies which included chest x-ray left rib series, CT head and cervical spine and I independently    visualized and interpreted imaging which showed age-indeterminate rib fracture no pneumothorax  Previous records obtained and reviewed no recent admissions Social determinants considered, no significant barriers Critical Interventions: None  After the interventions stated above, I reevaluated the patient and found patient to be satting well on room air with adequate pain control Admission and further testing considered, no indications for admission or further work-up at this time.  Patient states he has had rib fractures before and understands management.  Recommended close follow-up with PCP and return instructions discussed         Final Clinical Impression(s) / ED Diagnoses Final diagnoses:  Closed fracture of one rib of left side, initial encounter  Fall, initial encounter    Rx / DC Orders ED Discharge Orders     None         Hayden Rasmussen, MD 04/16/22 [phone]

## 2022-04-19 ENCOUNTER — Encounter: Payer: Self-pay | Admitting: Cardiology

## 2022-04-19 DIAGNOSIS — Z79899 Other long term (current) drug therapy: Secondary | ICD-10-CM

## 2022-04-19 DIAGNOSIS — R7989 Other specified abnormal findings of blood chemistry: Secondary | ICD-10-CM

## 2022-04-19 DIAGNOSIS — I5043 Acute on chronic combined systolic (congestive) and diastolic (congestive) heart failure: Secondary | ICD-10-CM

## 2022-04-23 ENCOUNTER — Other Ambulatory Visit: Payer: Self-pay | Admitting: *Deleted

## 2022-04-23 DIAGNOSIS — C8233 Follicular lymphoma grade IIIa, intra-abdominal lymph nodes: Secondary | ICD-10-CM

## 2022-04-26 ENCOUNTER — Telehealth: Payer: Self-pay

## 2022-04-26 ENCOUNTER — Other Ambulatory Visit: Payer: Self-pay

## 2022-04-26 ENCOUNTER — Inpatient Hospital Stay: Payer: Medicare Other | Attending: Hematology

## 2022-04-26 ENCOUNTER — Inpatient Hospital Stay: Payer: Medicare Other | Admitting: Hematology

## 2022-04-26 VITALS — BP 107/46 | HR 90 | Temp 97.4°F | Resp 18 | Ht 70.0 in | Wt 141.5 lb

## 2022-04-26 DIAGNOSIS — C8233 Follicular lymphoma grade IIIa, intra-abdominal lymph nodes: Secondary | ICD-10-CM | POA: Diagnosis not present

## 2022-04-26 DIAGNOSIS — Z8572 Personal history of non-Hodgkin lymphomas: Secondary | ICD-10-CM | POA: Insufficient documentation

## 2022-04-26 LAB — CBC WITH DIFFERENTIAL (CANCER CENTER ONLY)
Abs Immature Granulocytes: 0.04 10*3/uL (ref 0.00–0.07)
Basophils Absolute: 0.1 10*3/uL (ref 0.0–0.1)
Basophils Relative: 1 %
Eosinophils Absolute: 0.1 10*3/uL (ref 0.0–0.5)
Eosinophils Relative: 1 %
HCT: 34.4 % — ABNORMAL LOW (ref 39.0–52.0)
Hemoglobin: 11.3 g/dL — ABNORMAL LOW (ref 13.0–17.0)
Immature Granulocytes: 0 %
Lymphocytes Relative: 28 %
Lymphs Abs: 2.5 10*3/uL (ref 0.7–4.0)
MCH: 30.8 pg (ref 26.0–34.0)
MCHC: 32.8 g/dL (ref 30.0–36.0)
MCV: 93.7 fL (ref 80.0–100.0)
Monocytes Absolute: 0.9 10*3/uL (ref 0.1–1.0)
Monocytes Relative: 10 %
Neutro Abs: 5.4 10*3/uL (ref 1.7–7.7)
Neutrophils Relative %: 60 %
Platelet Count: 244 10*3/uL (ref 150–400)
RBC: 3.67 MIL/uL — ABNORMAL LOW (ref 4.22–5.81)
RDW: 17.1 % — ABNORMAL HIGH (ref 11.5–15.5)
WBC Count: 8.9 10*3/uL (ref 4.0–10.5)
nRBC: 0 % (ref 0.0–0.2)

## 2022-04-26 LAB — CMP (CANCER CENTER ONLY)
ALT: 265 U/L — ABNORMAL HIGH (ref 0–44)
AST: 353 U/L (ref 15–41)
Albumin: 3.4 g/dL — ABNORMAL LOW (ref 3.5–5.0)
Alkaline Phosphatase: 76 U/L (ref 38–126)
Anion gap: 7 (ref 5–15)
BUN: 37 mg/dL — ABNORMAL HIGH (ref 8–23)
CO2: 26 mmol/L (ref 22–32)
Calcium: 9.1 mg/dL (ref 8.9–10.3)
Chloride: 102 mmol/L (ref 98–111)
Creatinine: 2.56 mg/dL — ABNORMAL HIGH (ref 0.61–1.24)
GFR, Estimated: 24 mL/min — ABNORMAL LOW (ref >60)
Glucose, Bld: 217 mg/dL — ABNORMAL HIGH (ref 70–99)
Potassium: 3.8 mmol/L (ref 3.5–5.1)
Sodium: 135 mmol/L (ref 135–145)
Total Bilirubin: 2 mg/dL — ABNORMAL HIGH (ref 0.3–1.2)
Total Protein: 6.3 g/dL — ABNORMAL LOW (ref 6.5–8.1)

## 2022-04-26 LAB — LACTATE DEHYDROGENASE: LDH: 807 U/L — ABNORMAL HIGH (ref 98–192)

## 2022-04-26 NOTE — Telephone Encounter (Signed)
CRITICAL VALUE STICKER  CRITICAL VALUE:   AST  353  RECEIVER (on-site recipient of call):  Bonnita Nasuti, [place] NOTIFIED:   13:10  MESSENGER (representative from lab):  Nira Conn  MD NOTIFIED: Irene Limbo  TIME OF NOTIFICATION:13:16  RESPONSE:

## 2022-04-26 NOTE — Telephone Encounter (Deleted)
.  CRI

## 2022-04-27 NOTE — Telephone Encounter (Signed)
Repeat BMET in 10 days is scheduled for 05/06/22, as confirmed in this mychart message.

## 2022-04-30 ENCOUNTER — Other Ambulatory Visit: Payer: Self-pay

## 2022-04-30 ENCOUNTER — Encounter (HOSPITAL_COMMUNITY): Payer: Self-pay

## 2022-04-30 ENCOUNTER — Emergency Department (HOSPITAL_COMMUNITY): Payer: Medicare Other

## 2022-04-30 ENCOUNTER — Inpatient Hospital Stay (HOSPITAL_COMMUNITY)
Admission: EM | Admit: 2022-04-30 | Discharge: 2022-05-02 | DRG: 308 | Disposition: A | Discharge status: Hospice | Payer: Medicare Other | Attending: Internal Medicine | Admitting: Internal Medicine

## 2022-04-30 DIAGNOSIS — I5022 Chronic systolic (congestive) heart failure: Secondary | ICD-10-CM | POA: Diagnosis present

## 2022-04-30 DIAGNOSIS — R55 Syncope and collapse: Secondary | ICD-10-CM | POA: Diagnosis present

## 2022-04-30 DIAGNOSIS — E876 Hypokalemia: Secondary | ICD-10-CM | POA: Diagnosis present

## 2022-04-30 DIAGNOSIS — E7800 Pure hypercholesterolemia, unspecified: Secondary | ICD-10-CM | POA: Diagnosis not present

## 2022-04-30 DIAGNOSIS — C8293 Follicular lymphoma, unspecified, intra-abdominal lymph nodes: Secondary | ICD-10-CM

## 2022-04-30 DIAGNOSIS — I480 Paroxysmal atrial fibrillation: Secondary | ICD-10-CM | POA: Diagnosis present

## 2022-04-30 DIAGNOSIS — I10 Essential (primary) hypertension: Secondary | ICD-10-CM | POA: Diagnosis present

## 2022-04-30 DIAGNOSIS — S2239XD Fracture of one rib, unspecified side, subsequent encounter for fracture with routine healing: Secondary | ICD-10-CM

## 2022-04-30 DIAGNOSIS — C8590 Non-Hodgkin lymphoma, unspecified, unspecified site: Secondary | ICD-10-CM | POA: Diagnosis present

## 2022-04-30 DIAGNOSIS — Z8249 Family history of ischemic heart disease and other diseases of the circulatory system: Secondary | ICD-10-CM

## 2022-04-30 DIAGNOSIS — I441 Atrioventricular block, second degree: Secondary | ICD-10-CM | POA: Diagnosis present

## 2022-04-30 DIAGNOSIS — I447 Left bundle-branch block, unspecified: Secondary | ICD-10-CM | POA: Diagnosis present

## 2022-04-30 DIAGNOSIS — R778 Other specified abnormalities of plasma proteins: Secondary | ICD-10-CM

## 2022-04-30 DIAGNOSIS — I130 Hypertensive heart and chronic kidney disease with heart failure and stage 1 through stage 4 chronic kidney disease, or unspecified chronic kidney disease: Secondary | ICD-10-CM | POA: Diagnosis present

## 2022-04-30 DIAGNOSIS — Z7901 Long term (current) use of anticoagulants: Secondary | ICD-10-CM | POA: Diagnosis not present

## 2022-04-30 DIAGNOSIS — W19XXXA Unspecified fall, initial encounter: Secondary | ICD-10-CM | POA: Diagnosis present

## 2022-04-30 DIAGNOSIS — E785 Hyperlipidemia, unspecified: Secondary | ICD-10-CM | POA: Diagnosis present

## 2022-04-30 DIAGNOSIS — Z66 Do not resuscitate: Secondary | ICD-10-CM | POA: Diagnosis not present

## 2022-04-30 DIAGNOSIS — K219 Gastro-esophageal reflux disease without esophagitis: Secondary | ICD-10-CM | POA: Diagnosis present

## 2022-04-30 DIAGNOSIS — R0902 Hypoxemia: Secondary | ICD-10-CM | POA: Diagnosis present

## 2022-04-30 DIAGNOSIS — R7989 Other specified abnormal findings of blood chemistry: Secondary | ICD-10-CM

## 2022-04-30 DIAGNOSIS — R230 Cyanosis: Secondary | ICD-10-CM | POA: Diagnosis present

## 2022-04-30 DIAGNOSIS — N184 Chronic kidney disease, stage 4 (severe): Secondary | ICD-10-CM | POA: Diagnosis present

## 2022-04-30 DIAGNOSIS — Z87891 Personal history of nicotine dependence: Secondary | ICD-10-CM

## 2022-04-30 DIAGNOSIS — R001 Bradycardia, unspecified: Principal | ICD-10-CM | POA: Diagnosis present

## 2022-04-30 DIAGNOSIS — S01111A Laceration without foreign body of right eyelid and periocular area, initial encounter: Secondary | ICD-10-CM | POA: Diagnosis present

## 2022-04-30 DIAGNOSIS — N189 Chronic kidney disease, unspecified: Secondary | ICD-10-CM | POA: Diagnosis not present

## 2022-04-30 DIAGNOSIS — Z7189 Other specified counseling: Secondary | ICD-10-CM | POA: Diagnosis not present

## 2022-04-30 DIAGNOSIS — W19XXXD Unspecified fall, subsequent encounter: Secondary | ICD-10-CM | POA: Diagnosis present

## 2022-04-30 DIAGNOSIS — R7401 Elevation of levels of liver transaminase levels: Secondary | ICD-10-CM | POA: Diagnosis not present

## 2022-04-30 DIAGNOSIS — Z79899 Other long term (current) drug therapy: Secondary | ICD-10-CM | POA: Diagnosis not present

## 2022-04-30 DIAGNOSIS — R54 Age-related physical debility: Secondary | ICD-10-CM | POA: Diagnosis present

## 2022-04-30 DIAGNOSIS — N179 Acute kidney failure, unspecified: Secondary | ICD-10-CM | POA: Diagnosis present

## 2022-04-30 DIAGNOSIS — R296 Repeated falls: Secondary | ICD-10-CM | POA: Diagnosis present

## 2022-04-30 DIAGNOSIS — Z820 Family history of epilepsy and other diseases of the nervous system: Secondary | ICD-10-CM

## 2022-04-30 DIAGNOSIS — Z515 Encounter for palliative care: Secondary | ICD-10-CM

## 2022-04-30 DIAGNOSIS — K7200 Acute and subacute hepatic failure without coma: Secondary | ICD-10-CM | POA: Diagnosis present

## 2022-04-30 DIAGNOSIS — T447X5A Adverse effect of beta-adrenoreceptor antagonists, initial encounter: Secondary | ICD-10-CM | POA: Diagnosis present

## 2022-04-30 DIAGNOSIS — T462X5A Adverse effect of other antidysrhythmic drugs, initial encounter: Secondary | ICD-10-CM | POA: Diagnosis present

## 2022-04-30 LAB — BASIC METABOLIC PANEL
Anion gap: 7 (ref 5–15)
BUN: 49 mg/dL — ABNORMAL HIGH (ref 8–23)
CO2: 24 mmol/L (ref 22–32)
Calcium: 8.6 mg/dL — ABNORMAL LOW (ref 8.9–10.3)
Chloride: 109 mmol/L (ref 98–111)
Creatinine, Ser: 3.29 mg/dL — ABNORMAL HIGH (ref 0.61–1.24)
GFR, Estimated: 17 mL/min — ABNORMAL LOW (ref >60)
Glucose, Bld: 102 mg/dL — ABNORMAL HIGH (ref 70–99)
Potassium: 3.7 mmol/L (ref 3.5–5.1)
Sodium: 140 mmol/L (ref 135–145)

## 2022-04-30 LAB — CBC WITH DIFFERENTIAL/PLATELET
Abs Immature Granulocytes: 0.04 10*3/uL (ref 0.00–0.07)
Basophils Absolute: 0 10*3/uL (ref 0.0–0.1)
Basophils Relative: 0 %
Eosinophils Absolute: 0 10*3/uL (ref 0.0–0.5)
Eosinophils Relative: 0 %
HCT: 32.4 % — ABNORMAL LOW (ref 39.0–52.0)
Hemoglobin: 10.9 g/dL — ABNORMAL LOW (ref 13.0–17.0)
Immature Granulocytes: 1 %
Lymphocytes Relative: 18 %
Lymphs Abs: 1.2 10*3/uL (ref 0.7–4.0)
MCH: 32.1 pg (ref 26.0–34.0)
MCHC: 33.6 g/dL (ref 30.0–36.0)
MCV: 95.3 fL (ref 80.0–100.0)
Monocytes Absolute: 0.6 10*3/uL (ref 0.1–1.0)
Monocytes Relative: 8 %
Neutro Abs: 4.9 10*3/uL (ref 1.7–7.7)
Neutrophils Relative %: 73 %
Platelets: 207 10*3/uL (ref 150–400)
RBC: 3.4 MIL/uL — ABNORMAL LOW (ref 4.22–5.81)
RDW: 17.7 % — ABNORMAL HIGH (ref 11.5–15.5)
WBC: 6.7 10*3/uL (ref 4.0–10.5)
nRBC: 0 % (ref 0.0–0.2)

## 2022-04-30 LAB — MAGNESIUM: Magnesium: 2.4 mg/dL (ref 1.7–2.4)

## 2022-04-30 LAB — BRAIN NATRIURETIC PEPTIDE: B Natriuretic Peptide: 821.2 pg/mL — ABNORMAL HIGH (ref 0.0–100.0)

## 2022-04-30 LAB — TROPONIN I (HIGH SENSITIVITY): Troponin I (High Sensitivity): 132 ng/L (ref <18)

## 2022-04-30 MED ORDER — ATROPINE SULFATE 1 MG/ML IV SOLN: 1.0000 mg | Freq: Once | INTRAVENOUS | Status: DC

## 2022-04-30 MED ORDER — SODIUM CHLORIDE 0.9 % IV BOLUS
1000.0000 mL | Freq: Once | INTRAVENOUS | Status: AC
  Administered 2022-04-30: 1000 mL via INTRAVENOUS

## 2022-04-30 MED ORDER — ACETAMINOPHEN 650 MG RE SUPP: 650.0000 mg | Freq: Four times a day (QID) | RECTAL | Status: DC | PRN

## 2022-04-30 MED ORDER — ONDANSETRON HCL 4 MG PO TABS: 4.0000 mg | ORAL_TABLET | Freq: Four times a day (QID) | ORAL | Status: DC | PRN

## 2022-04-30 MED ORDER — ACETAMINOPHEN 325 MG PO TABS: 650.0000 mg | ORAL_TABLET | Freq: Four times a day (QID) | ORAL | Status: DC | PRN

## 2022-04-30 MED ORDER — ONDANSETRON HCL 4 MG/2ML IJ SOLN: 4.0000 mg | Freq: Four times a day (QID) | INTRAMUSCULAR | Status: DC | PRN

## 2022-04-30 MED ORDER — HEPARIN (PORCINE) 25000 UT/250ML-% IV SOLN
950.0000 [IU]/h | INTRAVENOUS | Status: DC
  Administered 2022-05-01: 950 [IU]/h via INTRAVENOUS
  Filled 2022-04-30: qty 250

## 2022-04-30 NOTE — Progress Notes (Signed)
   04/30/22 2245  Clinical Encounter Type  Visited With Family (SON: Dwan Bolt)  Visit Type Critical Care;Spiritual support  Referral From Family  Consult/Referral To Chaplain (Lilyona Richner [place])   Chaplain met patient's son and daughter-in-law in E.D. Lobby and escorted them to 2C-5. Son stated that his father was admitted for Advanced Heart Failure.  [address] [place], [place] [phone]

## 2022-04-30 NOTE — Assessment & Plan Note (Addendum)
Presumably pre-renal due to bradycardia / low cardiac output 1) bradycardia management as above 2) Hold ACEi (actually looks like he might have already been taken off of this med at baseline) 3) Hold lasix 4) strict intake and output 5) repeat CMP in AM 6) consider repeat CT AP to r/o worsening lymphoma / obstruction contributing to this, but will hold off for the moment since he just had this last month and the very high likely hood that HR of 25 probably playing a big role in his AKI.

## 2022-04-30 NOTE — Assessment & Plan Note (Addendum)
Presumably related (at least in part) to metoprolol + amiodarone used to treat PAF. 1. Holding metoprolol and amiodarone 2. Due to half life of amiodarone though, suspect bradycardia may be persistent for a while though. 1. Suspect he may end up requiring PPM during hospitalization. 2. Will make NPO after MN 3. And hold eliquis (use heparin gtt for AF instead). 3. 1L bolus in ED 4. BP looks okay at the moment (276 systolic), pt mentation and asymptomatic when laying down in bed, defer any decision on dopamine to cards. 5. Transferred patient to 2c05 over at [facility] 1. Spoke with Cards who is aware

## 2022-04-30 NOTE — Assessment & Plan Note (Signed)
Strongly suspect todays syncope episode to be related to the fact that his HR is as low as 24 at times here in the ED!

## 2022-04-30 NOTE — ED Provider Triage Note (Addendum)
Emergency Medicine Provider Triage Evaluation Note  Henry Nichols , a 88 y.o. male  was evaluated in triage.  Pt complains of multiple recent falls.  Overall a poor historian.  He does have a small superficial laceration above the right eyebrow, and bruising to the right eye.  Good EOMs without pain.  Without cervical spinal process tenderness.  Most recent fall today.  Does have some chest pain.  Also has a recent rib fracture from a fall.  Initially noted to be hypoxic.  However this is an accurate given we were not able to pick up a good Plath on the finger tips.  Repeat SPO2 of 100%.  Review of Systems  Positive: As above Negative: As above  Physical Exam  BP (!) 101/51 (BP Location: Left Arm)   Pulse 98   Temp (!) 97.3 F (36.3 C) (Oral)   Resp 15   Ht '5\' 10"'$  (1.778 m)   Wt 64.2 kg   SpO2 (!) 86%   BMI 20.30 kg/m  Gen:   Awake, no distress   Resp:  Normal effort  MSK:   Moves extremities without difficulty  Other:  Bilateral peripheral pitting edema noted.  Medical Decision Making  Medically screening exam initiated at 4:40 PM.  Appropriate orders placed.  MONT JAGODA was informed that the remainder of the evaluation will be completed by another provider, this initial triage assessment does not replace that evaluation, and the importance of remaining in the ED until their evaluation is complete.  1900: Patient with cyanotic changes to fingertips of both hands.  SPO2 remains at 100%.  Heart rate of mid 50s.  Per patient he feels similar to when he first came in.    Did advise nursing to make patient a level 2.  Evlyn Courier, PA-C 04/30/22 1642    Evlyn Courier, PA-C 04/30/22 1904

## 2022-04-30 NOTE — Assessment & Plan Note (Addendum)
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 Follows with heme/onc. May wish to order repeat CT AP given LFT elevations and AKI, but will hold off for the moment.

## 2022-04-30 NOTE — Assessment & Plan Note (Signed)
Hold metoprolol due to severe bradycardia. Hold lisinopril due to AKI (actually looks like he might be off this med already anyhow). Hold Lasix due to AKI.

## 2022-04-30 NOTE — ED Triage Notes (Addendum)
Patient reports that he fell while going to the bathroom and states his legs gave out. Patient's sats-86% on room air . Patient was placed on O2 3L/min via [place] and sats increased to 99%.  Patient has a small laceration to the right eyebrow area.

## 2022-04-30 NOTE — H&P (Addendum)
History and Physical    Patient: Henry Nichols DOB: Jan 10, 1932 DOA: 04/30/2022 DOS: the patient was seen and examined on 04/30/2022 PCP: [name], MD  Patient coming from: Home  Chief Complaint:  Chief Complaint  Patient presents with   Fall   Laceration   HPI: Henry Nichols is a 88 y.o. male with medical history significant of NHL of abdomen, HTN, A.Fib on eliquis and amiodarone.  Pt presents to ED with c/o lightheadedness and falls.  2-3 falls in past 2 weeks.  Lightheaded especially when standing.  Also when roling over in bed.  Today fell and struck forehead.  2 weeks ago fell and struck side and dx with rib fx in ED.  EF 30-35% June 2023 on echo.  In ED today: Pt is very bradycardic with HR as low as the 20s (please see EKG), though HR mostly in the mid 40s by time I am seeing him.  This appears to be a new issue.  His HR was documented as 90 during his office visit to onc just a couple of days ago, and EKG 9/5 shows HR 54.   Review of Systems: As mentioned in the history of present illness. All other systems reviewed and are negative. Past Medical History:  Diagnosis Date   Coronary artery calcification 08/03/2017   Essential hypertension 08/03/2017   GERD (gastroesophageal reflux disease)    HOH (hard of hearing)    Hyperlipidemia 08/03/2017   Hypertension    LBBB (left bundle branch block) 08/03/2017   Non-Hodgkin lymphoma ([place]) 08/03/2017   Pneumonia    hx   Small bowel mass    Varicose vein of leg 08/03/2017   Past Surgical History:  Procedure Laterality Date   CARDIOVERSION N/A 02/12/2022   Procedure: CARDIOVERSION;  Surgeon: Elouise Munroe, MD;  Location: [place];  Service: Cardiovascular;  Laterality: N/A;   EYE SURGERY Bilateral 2016   cataracts   HERNIA REPAIR Bilateral 2008   inguinal   LAPAROSCOPIC SMALL BOWEL RESECTION  07/06/2017   LAPAROSCOPIC SMALL BOWEL RESECTION N/A 07/06/2017   Procedure: LAPAROSCOPIC  ASSISTED SMALL BOWEL RESECTION;  Surgeon: Coralie Keens, MD;  Location: [place];  Service: General;  Laterality: N/A;   TEE WITHOUT CARDIOVERSION N/A 02/12/2022   Procedure: TRANSESOPHAGEAL ECHOCARDIOGRAM (TEE);  Surgeon: Elouise Munroe, MD;  Location: [facility] ENDOSCOPY;  Service: Cardiovascular;  Laterality: N/A;   Social History:  reports that he quit smoking about 37 years ago. His smoking use included cigars. He has never used smokeless tobacco. He reports that he does not drink alcohol and does not use drugs.  No Known Allergies  Family History  Problem Relation Age of Onset   Alzheimer's disease Mother    Heart disease Mother    Heart attack Father     Prior to Admission medications   Medication Sig Start Date End Date Taking? Authorizing Provider  acetaminophen (TYLENOL) 325 MG tablet Take 2 tablets (650 mg total) by mouth every 6 (six) hours as needed for mild pain or moderate pain. 07/09/17   Jill Alexanders, PA-C  albuterol (PROVENTIL HFA;VENTOLIN HFA) 108 (90 Base) MCG/ACT inhaler Inhale 1 puff every 6 (six) hours as needed into the lungs for wheezing or shortness of breath.    [provider]  amiodarone (PACERONE) 200 MG tablet Take 1 tablet (200 mg total) by mouth daily. 03/22/22   Fenton, Clint R, PA  apixaban (ELIQUIS) 5 MG TABS tablet Take 1 tablet (5 mg total) by mouth  2 (two) times daily. 03/17/22 04/16/22  Freada Bergeron, MD  atorvastatin (LIPITOR) 80 MG tablet Take 1 tablet (80 mg total) by mouth daily. 03/17/22   Freada Bergeron, MD  furosemide (LASIX) 20 MG tablet Take 1 tablet (20 mg total) by mouth daily. 03/17/22   Freada Bergeron, MD  lisinopril (ZESTRIL) 5 MG tablet Take 1 tablet (5 mg total) by mouth daily. Patient not taking: Reported on 04/26/2022 03/17/22   Freada Bergeron, MD  metoprolol tartrate (LOPRESSOR) 25 MG tablet Take 0.5 tablets (12.5 mg total) by mouth 2 (two) times daily. 03/17/22   Freada Bergeron, MD  triamcinolone cream  (KENALOG) 0.1 % Apply 1 application daily as needed topically (for dry skin).    [provider]    Physical Exam: Vitals:   04/30/22 2006 04/30/22 2011 04/30/22 2100 04/30/22 2236  BP:   (!) 134/59 135/68  Pulse: (!) 28 70 66   Resp: (!) '9 17 13 19  '$ Temp:    (!) 96.2 F (35.7 C)  TempSrc:    Axillary  SpO2: 90% (!) 30% 100% 100%  Weight:    62.8 kg  Height:    '5\' 10"'$  (1.778 m)   Constitutional: NAD, calm, comfortable Eyes: PERRL, lids and conjunctivae normal, Bruising to R upper eyelid / periorbital area ENMT: Mucous membranes are moist. Posterior pharynx clear of any exudate or lesions.Normal dentition.  Neck: normal, supple, no masses, no thyromegaly Respiratory: clear to auscultation bilaterally, no wheezing, no crackles. Normal respiratory effort. No accessory muscle use.  Cardiovascular: Bradycardic Abdomen: no tenderness, no masses palpated. No hepatosplenomegaly. Bowel sounds positive.  Musculoskeletal: no clubbing / cyanosis. No joint deformity upper and lower extremities. Good ROM, no contractures. Normal muscle tone.  Skin: no rashes, lesions, ulcers. No induration Neurologic: CN 2-12 grossly intact. Sensation intact, DTR normal. Strength 5/5 in all 4.  Psychiatric: Normal judgment and insight. Alert and oriented x 3. Normal mood.   Data Reviewed:       Latest Ref Rng & Units 04/30/2022    7:32 PM 04/26/2022   12:06 PM 04/13/2022   12:18 PM  CMP  Glucose 70 - 99 mg/dL 102  217  107   BUN 8 - 23 mg/dL 49  37  23   Creatinine 0.61 - 1.24 mg/dL 3.29  2.56  1.85   Sodium 135 - 145 mmol/L 140  135  143   Potassium 3.5 - 5.1 mmol/L 3.7  3.8  4.0   Chloride 98 - 111 mmol/L 109  102  105   CO2 22 - 32 mmol/L '24  26  24   '$ Calcium 8.9 - 10.3 mg/dL 8.6  9.1  9.5   Total Protein 6.5 - 8.1 g/dL  6.3    Total Bilirubin 0.3 - 1.2 mg/dL  2.0    Alkaline Phos 38 - 126 U/L  76    AST 15 - 41 U/L  353    ALT 0 - 44 U/L  265        Latest Ref Rng & Units 04/30/2022     7:32 PM 04/26/2022   12:06 PM 02/09/2022   12:36 AM  CBC  WBC 4.0 - 10.5 K/uL 6.7  8.9  4.7   Hemoglobin 13.0 - 17.0 g/dL 10.9  11.3  11.9   Hematocrit 39.0 - 52.0 % 32.4  34.4  37.6   Platelets 150 - 400 K/uL 207  244  274    Trop 132  BNP  821  EKG showing irregular Junctional brady with a rate of 25 (new), and LBBB (chronic).  Assessment and Plan: * Bradycardia with less than 30 beats per minute Presumably related (at least in part) to metoprolol + amiodarone used to treat PAF. Holding metoprolol and amiodarone Due to half life of amiodarone though, suspect bradycardia may be persistent for a while though. Suspect he may end up requiring PPM during hospitalization. Will make NPO after MN And hold eliquis (use heparin gtt for AF instead). 1L bolus in ED BP looks okay at the moment (510 systolic), pt mentation and asymptomatic when laying down in bed, defer any decision on dopamine to cards. Transferred patient to 2c05 over at [facility] with Cards who is aware  Syncope Strongly suspect todays syncope episode to be related to the fact that his HR is as low as 24 at times here in the ED!  Transaminitis Severe transaminitis on lab work x4 days ago. DDx includes (but not limited to): 1) amiodarone toxicity 2) shock liver from profound bradycardia (though sounds like the bradycardia really just started today, HR in office that day was 90) 3) lymphoma involvement of liver (though didn't appear to have any involvement as of CT scan just last month).  Acute kidney injury superimposed on chronic kidney disease (HCC) Presumably pre-renal due to bradycardia / low cardiac output 1) bradycardia management as above 2) Hold ACEi (actually looks like he might have already been taken off of this med at baseline) 3) Hold lasix 4) strict intake and output 5) repeat CMP in AM 6) consider repeat CT AP to r/o worsening lymphoma / obstruction contributing to this, but will hold off for the moment  since he just had this last month and the very high likely hood that HR of 25 probably playing a big role in his AKI.  Non-Hodgkin lymphoma ([place]) NHL with recurrent / progressive disease in inferior mesentery according to CT scan last month. Follows with heme/onc. May wish to order repeat CT AP given LFT elevations and AKI, but will hold off for the moment.  PAF (paroxysmal atrial fibrillation) (HCC) Holding amiodarone and metoprolol as above Holding eliquis and putting pt on heparin gtt instead (incase PPM placement needed).  Hyperlipidemia Hold statin for the moment due to AKI and transaminitis.  Essential hypertension Hold metoprolol due to severe bradycardia. Hold lisinopril due to AKI (actually looks like he might be off this med already anyhow). Hold Lasix due to AKI.      Advance Care Planning:   Code Status: DNR Discussed with Son and pt  Consults: Cards, Dr. Wilmon Pali  Family Communication: Family at bedside  Severity of Illness: The appropriate patient status for this patient is INPATIENT. Inpatient status is judged to be reasonable and necessary in order to provide the required intensity of service to ensure the patient's safety. The patient's presenting symptoms, physical exam findings, and initial radiographic and laboratory data in the context of their chronic comorbidities is felt to place them at high risk for further clinical deterioration. Furthermore, it is not anticipated that the patient will be medically stable for discharge from the hospital within 2 midnights of admission.   * I certify that at the point of admission it is my clinical judgment that the patient will require inpatient hospital care spanning beyond 2 midnights from the point of admission due to high intensity of service, high risk for further deterioration and high frequency of surveillance required.*  Author: Etta Quill., DO 04/30/2022  11:14 PM  For on call review [email or web address].

## 2022-04-30 NOTE — Assessment & Plan Note (Signed)
Holding amiodarone and metoprolol as above Holding eliquis and putting pt on heparin gtt instead (incase PPM placement needed).

## 2022-04-30 NOTE — ED Provider Notes (Signed)
[place] DEPT Provider Note   CSN: [id number] Arrival date & time: 04/30/22  1547     History  Chief Complaint  Patient presents with   Fall   Laceration    Henry Nichols is a 88 y.o. male with history of heart failure, A-fib, on Eliquis, amiodarone 200 mg, metoprolol 12.5 mg twice daily, presented to ED with lightheadedness and fall.  Patient's family reports that he has had 2-3 falls in the past 2 weeks.  Patient complains of lightheadedness particularly when standing.  Also when rolling over in the bed.  He fell and struck his forehead today.  2 weeks ago he fell and struck his side and was diagnosed with a rib fracture in the ED.  The patient last had an echocardiogram in June 2023, with EF 30 to 35%.  Her cardiologist evaluation last month in July, Dr Johney Frame notes pt has a hx of HTN, LBBB, non-Hodgkin's in,, and a flutter.  Patient is on diuretics at home.  His heart rate was noted to be 100 in the office he is continued on metoprolol 12.5 mg and amio 200 mg.  HPI     Home Medications Prior to Admission medications   Medication Sig Start Date End Date Taking? Authorizing Provider  acetaminophen (TYLENOL) 325 MG tablet Take 2 tablets (650 mg total) by mouth every 6 (six) hours as needed for mild pain or moderate pain. 07/09/17   Jill Alexanders, PA-C  albuterol (PROVENTIL HFA;VENTOLIN HFA) 108 (90 Base) MCG/ACT inhaler Inhale 1 puff every 6 (six) hours as needed into the lungs for wheezing or shortness of breath.    [provider]  amiodarone (PACERONE) 200 MG tablet Take 1 tablet (200 mg total) by mouth daily. 03/22/22   Fenton, Clint R, PA  apixaban (ELIQUIS) 5 MG TABS tablet Take 1 tablet (5 mg total) by mouth 2 (two) times daily. 03/17/22 04/16/22  Freada Bergeron, MD  atorvastatin (LIPITOR) 80 MG tablet Take 1 tablet (80 mg total) by mouth daily. 03/17/22   Freada Bergeron, MD  furosemide (LASIX) 20 MG tablet Take 1  tablet (20 mg total) by mouth daily. 03/17/22   Freada Bergeron, MD  lisinopril (ZESTRIL) 5 MG tablet Take 1 tablet (5 mg total) by mouth daily. Patient not taking: Reported on 04/26/2022 03/17/22   Freada Bergeron, MD  metoprolol tartrate (LOPRESSOR) 25 MG tablet Take 0.5 tablets (12.5 mg total) by mouth 2 (two) times daily. 03/17/22   Freada Bergeron, MD  triamcinolone cream (KENALOG) 0.1 % Apply 1 application daily as needed topically (for dry skin).    [provider]      Allergies    Patient has no known allergies.    Review of Systems   Review of Systems  Physical Exam Updated Vital Signs BP (!) 134/59   Pulse 66   Temp (!) 97.3 F (36.3 C) (Oral)   Resp 13   Ht '5\' 10"'$  (1.778 m)   Wt 64.2 kg   SpO2 100%   BMI 20.30 kg/m  Physical Exam Constitutional:      General: He is not in acute distress. HENT:     Head: Normocephalic.     Comments: 1 cm nonbleeding lac over the right eyebrow Hematoma right eye Eyes:     Conjunctiva/sclera: Conjunctivae normal.     Pupils: Pupils are equal, round, and reactive to light.  Cardiovascular:     Rate and Rhythm: Bradycardia present. Rhythm  irregular.     Comments: HR 28-50 bpm Pulmonary:     Effort: Pulmonary effort is normal. No respiratory distress.  Abdominal:     General: There is no distension.     Tenderness: There is no abdominal tenderness.  Skin:    General: Skin is warm and dry.  Neurological:     General: No focal deficit present.     Mental Status: He is alert. Mental status is at baseline.  Psychiatric:        Mood and Affect: Mood normal.        Behavior: Behavior normal.     ED Results / Procedures / Treatments   Labs (all labs ordered are listed, but only abnormal results are displayed) Labs Reviewed  CBC WITH DIFFERENTIAL/PLATELET - Abnormal; Notable for the following components:      Result Value   RBC 3.40 (*)    Hemoglobin 10.9 (*)    HCT 32.4 (*)    RDW 17.7 (*)    All other  components within normal limits  BASIC METABOLIC PANEL - Abnormal; Notable for the following components:   Glucose, Bld 102 (*)    BUN 49 (*)    Creatinine, Ser 3.29 (*)    Calcium 8.6 (*)    GFR, Estimated 17 (*)    All other components within normal limits  BRAIN NATRIURETIC PEPTIDE - Abnormal; Notable for the following components:   B Natriuretic Peptide 821.2 (*)    All other components within normal limits  TROPONIN I (HIGH SENSITIVITY) - Abnormal; Notable for the following components:   Troponin I (High Sensitivity) 132 (*)    All other components within normal limits  MAGNESIUM  HEPATIC FUNCTION PANEL  TROPONIN I (HIGH SENSITIVITY)    EKG None  Radiology DG Chest Portable 1 View  Result Date: 04/30/2022 CLINICAL DATA:  Dyspnea chest pain EXAM: PORTABLE CHEST 1 VIEW COMPARISON:  04/15/2022, CT 03/31/2022 FINDINGS: Small right-sided pleural effusion without significant change. Cardiomegaly. Aortic atherosclerosis. IMPRESSION: Small right-sided pleural effusion without significant change. Cardiomegaly Electronically Signed   By: Donavan Foil M.D.   On: 04/30/2022 17:40   CT Cervical Spine Wo Contrast  Result Date: 04/30/2022 CLINICAL DATA:  Trauma fall EXAM: CT CERVICAL SPINE WITHOUT CONTRAST TECHNIQUE: Multidetector CT imaging of the cervical spine was performed without intravenous contrast. Multiplanar CT image reconstructions were also generated. RADIATION DOSE REDUCTION: This exam was performed according to the departmental dose-optimization program which includes automated exposure control, adjustment of the mA and/or kV according to patient size and/or use of iterative reconstruction technique. COMPARISON:  CT 04/15/2022 FINDINGS: Alignment: No subluxation.  Facet alignment within normal limits. Skull base and vertebrae: No acute fracture. No primary bone lesion or focal pathologic process. Soft tissues and spinal canal: No prevertebral fluid or swelling. No visible canal  hematoma. Disc levels: Mild disc space narrowing C6-C7 and C7-T1. Facet degenerative changes at multiple levels Upper chest: Negative. Other: None IMPRESSION: Mild degenerative changes.  No acute osseous abnormality. Electronically Signed   By: Donavan Foil M.D.   On: 04/30/2022 17:39   CT Head Wo Contrast  Result Date: 04/30/2022 CLINICAL DATA:  Trauma laceration EXAM: CT HEAD WITHOUT CONTRAST TECHNIQUE: Contiguous axial images were obtained from the base of the skull through the vertex without intravenous contrast. RADIATION DOSE REDUCTION: This exam was performed according to the departmental dose-optimization program which includes automated exposure control, adjustment of the mA and/or kV according to patient size and/or use of iterative reconstruction technique.  COMPARISON:  CT brain 04/15/2022 FINDINGS: Brain: No acute territorial infarction, hemorrhage or intracranial mass. Atrophy. Moderate white matter hypodensity consistent with chronic small vessel disease. Stable ventricle size. Vascular: No hyperdense vessels.  Carotid vascular calcification. Skull: No depressed skull fracture. Sinuses/Orbits: Moderate mucosal sinus disease. Other: None IMPRESSION: 1. No CT evidence for acute intracranial abnormality 2. Atrophy and chronic small vessel ischemic changes of the white matter Electronically Signed   By: Donavan Foil M.D.   On: 04/30/2022 17:35    Procedures Procedures    Medications Ordered in ED Medications  sodium chloride 0.9 % bolus 1,000 mL (1,000 mLs Intravenous New Bag/Given 04/30/22 2039)    ED Course/ Medical Decision Making/ A&P Clinical Course as of 04/30/22 2204  Fri Apr 30, 2022  1947 Patient is bradycardic with heart rate 28-40 bpm at rest in the room, BP stable.  Advised nursing to place pacer pads on the patient's chest to be used as needed. [MT]  2016 I spoke to Dr Wilmon Pali from cardiology who advised checking orthostatics, hold amio and lopressor, admit to medicine  service on telemetry, and cardiology will consult after transfer to [facility]. [MT]  2112 Admitted to Dr Alcario Drought [MT]    Clinical Course User Index [MT] Langston Masker Carola Rhine, MD                           Medical Decision Making Risk Decision regarding hospitalization.   This patient presents to the ED with concern for weakness, lightheaded. This involves an extensive number of treatment options, and is a complaint that carries with it a high risk of complications and morbidity.  The differential diagnosis includes arrhythmia versus anemia versus dehydration versus other  Co-morbidities that complicate the patient evaluation: History of A-fib on beta-blockers at high risk for cardiac dysrhythmias  Additional history obtained from patient's son at the bedside  External records from outside source obtained and reviewed including allergy office evaluation and echocardiogram as noted above  I ordered and personally interpreted labs.  The pertinent results include: No significant acute anemia, no leukocytosis.  Troponins are elevated in the setting of worsening kidney function.    However he has no chest pain or discomfort to suggest that this is a non-STEMI or acute coronary syndrome.  I ordered imaging studies including CT head, CT C-spine, x-ray of the chest; ordered as trauma imaging given the patient a fall today. I independently visualized and interpreted imaging which showed no acute traumatic injuries I agree with the radiologist interpretation  The patient was maintained on a cardiac monitor.  I personally viewed and interpreted the cardiac monitored which showed an underlying rhythm of: Junctional bradycardia  Per my interpretation the patient's ECG shows junctional bradycardia  I ordered medication including fluids for dehydration  I have reviewed the patients home medicines and have made adjustments as needed -hold amiodarone and beta-blocker  Test Considered: Low suspicion for PE  at this time  I requested consultation with the cardiology,  and discussed lab and imaging findings as well as pertinent plan - they recommend: See ED course  After the interventions noted above, I reevaluated the patient and found that they have: stayed the same  The patient remained stable at this time, blood pressure stable and mentating well.  His family is having ongoing discussions with him about CODE STATUS, as he has never considered this before.  Dispostion:  After consideration of the diagnostic results and the patients response  to treatment, I feel that the patent would benefit from medical admission.         Final Clinical Impression(s) / ED Diagnoses Final diagnoses:  Junctional bradycardia  Elevated troponin  Elevated serum creatinine    Rx / DC Orders ED Discharge Orders     None         Wyvonnia Dusky, MD 04/30/22 2204

## 2022-04-30 NOTE — Assessment & Plan Note (Addendum)
Severe transaminitis on lab work x4 days ago. DDx includes (but not limited to): 1) amiodarone toxicity 2) shock liver from profound bradycardia (though sounds like the bradycardia really just started today, HR in office that day was 90) 3) lymphoma involvement of liver (though didn't appear to have any involvement as of CT scan just last month).

## 2022-04-30 NOTE — Assessment & Plan Note (Signed)
Hold statin for the moment due to AKI and transaminitis.

## 2022-04-30 NOTE — Progress Notes (Signed)
ANTICOAGULATION CONSULT NOTE - Initial Consult  Pharmacy Consult for Heparin Indication: atrial fibrillation  No Known Allergies  Patient Measurements: Height: '5\' 10"'$  (177.8 cm) Weight: 62.8 kg (138 lb 7.2 oz) IBW/kg (Calculated) : 73   Vital Signs: Temp: 97.3 F (36.3 C) (09/15 1612) Temp Source: Oral (09/15 1612) BP: 135/68 (09/15 2236) Pulse Rate: 66 (09/15 2100)  Labs: Recent Labs    04/30/22 1932  HGB 10.9*  HCT 32.4*  PLT 207  CREATININE 3.29*  TROPONINIHS 132*    Estimated Creatinine Clearance: 14.1 mL/min (A) (by C-G formula based on SCr of 3.29 mg/dL (H)).   Medical History: Past Medical History:  Diagnosis Date   Coronary artery calcification 08/03/2017   Essential hypertension 08/03/2017   GERD (gastroesophageal reflux disease)    HOH (hard of hearing)    Hyperlipidemia 08/03/2017   Hypertension    LBBB (left bundle branch block) 08/03/2017   Non-Hodgkin lymphoma ([place]) 08/03/2017   Pneumonia    hx   Small bowel mass    Varicose vein of leg 08/03/2017    Assessment: 88 y.o. male with history of heart failure, A-fib, on Eliquis, amiodarone 200 mg, metoprolol 12.5 mg twice daily, presented to ED with lightheadedness and fall. Pharmacy consulted to start heparin drip for afib.  Will need to monitor with aPTT until heparin levels correlate as the eliquis washes out.  Goal of Therapy:  Heparin level 0.3-0.7 units/ml Monitor platelets by anticoagulation protocol: Yes   Plan:  Start heparin infusion at 950 units/hr Check anti-Xa level and aPTT in 8 hours and daily while on heparin Continue to monitor H&H and platelets  Alanda Slim, PharmD, [facility] Clinical Pharmacist Please see AMION for all Pharmacists' Contact Phone Numbers 04/30/2022, 10:57 PM

## 2022-05-01 ENCOUNTER — Inpatient Hospital Stay (HOSPITAL_COMMUNITY): Payer: Medicare Other

## 2022-05-01 DIAGNOSIS — R7401 Elevation of levels of liver transaminase levels: Secondary | ICD-10-CM | POA: Diagnosis not present

## 2022-05-01 DIAGNOSIS — Z515 Encounter for palliative care: Secondary | ICD-10-CM | POA: Diagnosis not present

## 2022-05-01 DIAGNOSIS — C8293 Follicular lymphoma, unspecified, intra-abdominal lymph nodes: Secondary | ICD-10-CM | POA: Diagnosis not present

## 2022-05-01 DIAGNOSIS — N179 Acute kidney failure, unspecified: Secondary | ICD-10-CM | POA: Diagnosis not present

## 2022-05-01 DIAGNOSIS — R001 Bradycardia, unspecified: Secondary | ICD-10-CM | POA: Diagnosis not present

## 2022-05-01 DIAGNOSIS — Z7189 Other specified counseling: Secondary | ICD-10-CM | POA: Diagnosis not present

## 2022-05-01 LAB — HEPATIC FUNCTION PANEL
ALT: 309 U/L — ABNORMAL HIGH (ref 0–44)
AST: 346 U/L — ABNORMAL HIGH (ref 15–41)
Albumin: 2.5 g/dL — ABNORMAL LOW (ref 3.5–5.0)
Alkaline Phosphatase: 67 U/L (ref 38–126)
Bilirubin, Direct: 0.6 mg/dL — ABNORMAL HIGH (ref 0.0–0.2)
Indirect Bilirubin: 1 mg/dL — ABNORMAL HIGH (ref 0.3–0.9)
Total Bilirubin: 1.6 mg/dL — ABNORMAL HIGH (ref 0.3–1.2)
Total Protein: 5 g/dL — ABNORMAL LOW (ref 6.5–8.1)

## 2022-05-01 LAB — COMPREHENSIVE METABOLIC PANEL
ALT: 304 U/L — ABNORMAL HIGH (ref 0–44)
AST: 346 U/L — ABNORMAL HIGH (ref 15–41)
Albumin: 2.4 g/dL — ABNORMAL LOW (ref 3.5–5.0)
Alkaline Phosphatase: 68 U/L (ref 38–126)
Anion gap: 11 (ref 5–15)
BUN: 47 mg/dL — ABNORMAL HIGH (ref 8–23)
CO2: 21 mmol/L — ABNORMAL LOW (ref 22–32)
Calcium: 8.1 mg/dL — ABNORMAL LOW (ref 8.9–10.3)
Chloride: 104 mmol/L (ref 98–111)
Creatinine, Ser: 3.17 mg/dL — ABNORMAL HIGH (ref 0.61–1.24)
GFR, Estimated: 18 mL/min — ABNORMAL LOW (ref >60)
Glucose, Bld: 112 mg/dL — ABNORMAL HIGH (ref 70–99)
Potassium: 3.4 mmol/L — ABNORMAL LOW (ref 3.5–5.1)
Sodium: 136 mmol/L (ref 135–145)
Total Bilirubin: 1.6 mg/dL — ABNORMAL HIGH (ref 0.3–1.2)
Total Protein: 5.1 g/dL — ABNORMAL LOW (ref 6.5–8.1)

## 2022-05-01 LAB — SURGICAL PCR SCREEN
MRSA, PCR: NEGATIVE
Staphylococcus aureus: NEGATIVE

## 2022-05-01 LAB — CBC
HCT: 30.5 % — ABNORMAL LOW (ref 39.0–52.0)
Hemoglobin: 10 g/dL — ABNORMAL LOW (ref 13.0–17.0)
MCH: 30.9 pg (ref 26.0–34.0)
MCHC: 32.8 g/dL (ref 30.0–36.0)
MCV: 94.1 fL (ref 80.0–100.0)
Platelets: 188 10*3/uL (ref 150–400)
RBC: 3.24 MIL/uL — ABNORMAL LOW (ref 4.22–5.81)
RDW: 17.7 % — ABNORMAL HIGH (ref 11.5–15.5)
WBC: 5.4 10*3/uL (ref 4.0–10.5)
nRBC: 0 % (ref 0.0–0.2)

## 2022-05-01 LAB — APTT: aPTT: 30 seconds (ref 24–36)

## 2022-05-01 LAB — TROPONIN I (HIGH SENSITIVITY): Troponin I (High Sensitivity): 122 ng/L (ref <18)

## 2022-05-01 LAB — HEPARIN LEVEL (UNFRACTIONATED): Heparin Unfractionated: 0.39 IU/mL (ref 0.30–0.70)

## 2022-05-01 MED ORDER — BISACODYL 10 MG RE SUPP: 10.0000 mg | Freq: Every day | RECTAL | Status: DC | PRN

## 2022-05-01 MED ORDER — BIOTENE DRY MOUTH MT LIQD: 15.0000 mL | OROMUCOSAL | Status: DC | PRN

## 2022-05-01 MED ORDER — POLYVINYL ALCOHOL 1.4 % OP SOLN: 1.0000 [drp] | Freq: Four times a day (QID) | OPHTHALMIC | Status: DC | PRN

## 2022-05-01 MED ORDER — HYDROMORPHONE HCL 1 MG/ML IJ SOLN
0.5000 mg | INTRAMUSCULAR | Status: DC | PRN
  Administered 2022-05-01: 1 mg via INTRAVENOUS
  Administered 2022-05-01: 0.5 mg via INTRAVENOUS
  Filled 2022-05-01: qty 1
  Filled 2022-05-01: qty 0.5

## 2022-05-01 MED ORDER — LORAZEPAM 2 MG/ML IJ SOLN
0.5000 mg | INTRAMUSCULAR | Status: DC | PRN
  Administered 2022-05-01: 0.5 mg via INTRAVENOUS
  Administered 2022-05-02: 1 mg via INTRAVENOUS
  Filled 2022-05-01 (×2): qty 1

## 2022-05-01 MED ORDER — GLYCOPYRROLATE 0.2 MG/ML IJ SOLN: 0.2000 mg | INTRAMUSCULAR | Status: DC | PRN

## 2022-05-01 MED ORDER — APIXABAN 2.5 MG PO TABS: 2.5000 mg | ORAL_TABLET | Freq: Two times a day (BID) | ORAL | Status: DC

## 2022-05-01 MED ORDER — GLYCOPYRROLATE 1 MG PO TABS: 1.0000 mg | ORAL_TABLET | ORAL | Status: DC | PRN

## 2022-05-01 NOTE — Plan of Care (Signed)
  Problem: Coping: Goal: Level of anxiety will decrease Outcome: Progressing   Problem: Pain Managment: Goal: General experience of comfort will improve Outcome: Progressing   

## 2022-05-01 NOTE — Progress Notes (Addendum)
PROGRESS NOTE    Henry Nichols  MOQ:[id number] DOB: [birth year] DOA: 04/30/2022 PCP: [name], MD  88/M with history of stage IV non-Hodgkin's lymphoma of abdomen, chronic systolic CHF, EF 35-46%, paroxysmal A-fib on Eliquis and amiodarone, hypertension, dyslipidemia, hard of hearing presenting to the ED with weakness, multiple falls, low blood pressure. -In the ED he was hypoxic with O2 sats of 86% on room air, ED noted to have a bruise, right periorbital and small laceration. -In the ED bradycardic as low as 20s, heart rate fluctuating in the 30-40 range, blood pressure fluctuating as well.  Labs notable for worsening kidney function, with creatinine of 3.2, baseline around 1.4, also noted to have normal LFTs, AST ALT in the 300s, bili of 1.6, alk phos is normal.  Subjective: Feels tired, poor historian, unclear if he passed out  Assessment and Plan:  Profound sinus bradycardia, heart block Low output state/failure -Holding metoprolol and amiodarone -However amiodarone has a long half-life -Cardiology consulting, anticipate he might not be a good candidate for advanced therapies, PPM, ?inotropes considering advanced age, progressive lymphoma, d/w Cards, await cardiology input today -Has 2+ edema and pleural effusion, holding diuretics at this time with low BP -He is DNR  AKI on CKD 3a -Baseline creatinine around 1.4, creatinine up to 3.4 on admission -Likely secondary to above, low output state, worsened by lisinopril -Stat renal ultrasound ruled out obstruction or hydronephrosis -Cardiology consulting, await input this morning  Abnormal LFTs/transaminitis -Alk phos is normal ruling out obstructive physiology -CT abdomen with contrast last month did not indicate evidence of hepatic involvement(infiltration of lymphoma) -I think this is secondary to low output state, mild shock liver, await cardiology input -Amiodarone on hold  Stage IV Non-Hodgkin lymphoma ([name]) NHL  with recurrent / progressive disease in inferior mesentery according to CT scan last month. -Followed by Dr.Kale -Patient reports 40 pound weight loss,, LDH has gone up 4X in the last 6 months, indicating recurrence, further corroborated by CT last month -Has not seen his oncologist since the scans  PAF (paroxysmal atrial fibrillation) ([name]) Holding amiodarone and metoprolol as above Holding eliquis and currently on heparin GTT  Hyperlipidemia Hold statin   Essential hypertension Hold metoprolol due to severe bradycardia. Hold lisinopril, Lasix  DVT prophylaxis: IV heparin Code Status: DNR Family Communication: No family at bedside, will call and update son Disposition Plan: To be determined  Consultants: Cardiology   Procedures:   Antimicrobials:    Objective: Vitals:   04/30/22 2354 05/01/22 0000 05/01/22 0407 05/01/22 0734  BP: (!) 150/61 (!) 146/64 (!) 112/56 (!) 97/51  Pulse: (!) 46 (!) 47 (!) 47 (!) 44  Resp: 16 14 17 16   Temp: 97.7 F (36.5 C)  97.6 F (36.4 C) 97.6 F (36.4 C)  TempSrc: Oral  Oral Oral  SpO2: 100% 100% 100% 100%  Weight:      Height:        Intake/Output Summary (Last 24 hours) at 05/01/2022 0934 Last data filed at 05/01/2022 0400 Gross per 24 hour  Intake 393.72 ml  Output 125 ml  Net 268.72 ml   Filed Weights   04/30/22 1631 04/30/22 2236  Weight: 64.2 kg 62.8 kg    Examination:  General exam: Elderly frail chronically ill male sitting up in bed, awake alert, oriented to self and partly to place  HEENT: Positive JVD CVS: S1-S2, bradycardic Lungs: Decreased breath sounds at both bases Abdomen: Soft, nontender, bowel sounds present Extremities: 2+ edema Skin: No rashes Psychiatry:  Poor insight and judgment    Data Reviewed:   CBC: Recent Labs  Lab 04/26/22 1206 04/30/22 1932 05/01/22 0037  WBC 8.9 6.7 5.4  NEUTROABS 5.4 4.9  --   HGB 11.3* 10.9* 10.0*  HCT 34.4* 32.4* 30.5*  MCV 93.7 95.3 94.1  PLT 244 207 071    Basic Metabolic Panel: Recent Labs  Lab 04/26/22 1206 04/30/22 1932 05/01/22 0037  NA 135 140 136  K 3.8 3.7 3.4*  CL 102 109 104  CO2 26 24 21*  GLUCOSE 217* 102* 112*  BUN 37* 49* 47*  CREATININE 2.56* 3.29* 3.17*  CALCIUM 9.1 8.6* 8.1*  MG  --  2.4  --    GFR: Estimated Creatinine Clearance: 14.6 mL/min (A) (by C-G formula based on SCr of 3.17 mg/dL (H)). Liver Function Tests: Recent Labs  Lab 04/26/22 1206 05/01/22 0037  AST 353* 346*  346*  ALT 265* 309*  304*  ALKPHOS 76 67  68  BILITOT 2.0* 1.6*  1.6*  PROT 6.3* 5.0*  5.1*  ALBUMIN 3.4* 2.5*  2.4*   No results for input(s): "LIPASE", "AMYLASE" in the last 168 hours. No results for input(s): "AMMONIA" in the last 168 hours. Coagulation Profile: No results for input(s): "INR", "PROTIME" in the last 168 hours. Cardiac Enzymes: No results for input(s): "CKTOTAL", "CKMB", "CKMBINDEX", "TROPONINI" in the last 168 hours. BNP (last 3 results) Recent Labs    03/11/22 1142  PROBNP 4,414*   HbA1C: No results for input(s): "HGBA1C" in the last 72 hours. CBG: No results for input(s): "GLUCAP" in the last 168 hours. Lipid Profile: No results for input(s): "CHOL", "HDL", "LDLCALC", "TRIG", "CHOLHDL", "LDLDIRECT" in the last 72 hours. Thyroid Function Tests: No results for input(s): "TSH", "T4TOTAL", "FREET4", "T3FREE", "THYROIDAB" in the last 72 hours. Anemia Panel: No results for input(s): "VITAMINB12", "FOLATE", "FERRITIN", "TIBC", "IRON", "RETICCTPCT" in the last 72 hours. Urine analysis: No results found for: "COLORURINE", "APPEARANCEUR", "LABSPEC", "PHURINE", "GLUCOSEU", "HGBUR", "BILIRUBINUR", "KETONESUR", "PROTEINUR", "UROBILINOGEN", "NITRITE", "LEUKOCYTESUR" Sepsis Labs: @LABRCNTIP (procalcitonin:4,lacticidven:4)  ) Recent Results (from the past 240 hour(s))  Surgical pcr screen     Status: None   Collection Time: 04/30/22 10:34 PM   Specimen: Nasal Mucosa; Nasal Swab  Result Value Ref Range  Status   MRSA, PCR NEGATIVE NEGATIVE Final   Staphylococcus aureus NEGATIVE NEGATIVE Final    Comment: (NOTE) The Xpert SA Assay (FDA approved for NASAL specimens in patients 38 years of age and older), is one component of a comprehensive surveillance program. It is not intended to diagnose infection nor to guide or monitor treatment. Performed at [facility], [place] [address], [place], [place] [address]      Radiology Studies: US RENAL  Result Date: 05/01/2022 CLINICAL DATA:  Renal dysfunction EXAM: RENAL / URINARY TRACT ULTRASOUND COMPLETE COMPARISON:  None Available. FINDINGS: Right Kidney: Renal measurements: 8.7 x 4.3 x 5.4 cm = volume: 106.4 mL. There is no hydronephrosis. There is increased cortical echogenicity. Left Kidney: Renal measurements: Left kidney measures 9.5 x 5.4 by 5.2 cm = volume: 139.3 mL. There is no hydronephrosis. There is a lobulated cyst in the upper pole of left kidney measuring 5.1 cm in maximum diameter. There is slightly increased cortical echogenicity. There is minimal perinephric fluid. Bladder: Urinary bladder is not distended. There is hyperechoic focus in the upper margin of prostate, possibly calcification in the prostate. Other: There is moderate to large right pleural effusion. IMPRESSION: There is no hydronephrosis. Increased cortical echogenicity may suggest medical  renal disease. There is 5.1 cm left renal cyst. Moderate to large right pleural effusion. Electronically Signed   By: Elmer Picker M.D.   On: 05/01/2022 08:51   DG Chest Portable 1 View  Result Date: 04/30/2022 CLINICAL DATA:  Dyspnea chest pain EXAM: PORTABLE CHEST 1 VIEW COMPARISON:  04/15/2022, CT 03/31/2022 FINDINGS: Small right-sided pleural effusion without significant change. Cardiomegaly. Aortic atherosclerosis. IMPRESSION: Small right-sided pleural effusion without significant change. Cardiomegaly Electronically Signed   By: Donavan Foil M.D.   On: 04/30/2022 17:40   CT  Cervical Spine Wo Contrast  Result Date: 04/30/2022 CLINICAL DATA:  Trauma fall EXAM: CT CERVICAL SPINE WITHOUT CONTRAST TECHNIQUE: Multidetector CT imaging of the cervical spine was performed without intravenous contrast. Multiplanar CT image reconstructions were also generated. RADIATION DOSE REDUCTION: This exam was performed according to the departmental dose-optimization program which includes automated exposure control, adjustment of the mA and/or kV according to patient size and/or use of iterative reconstruction technique. COMPARISON:  CT 04/15/2022 FINDINGS: Alignment: No subluxation.  Facet alignment within normal limits. Skull base and vertebrae: No acute fracture. No primary bone lesion or focal pathologic process. Soft tissues and spinal canal: No prevertebral fluid or swelling. No visible canal hematoma. Disc levels: Mild disc space narrowing C6-C7 and C7-T1. Facet degenerative changes at multiple levels Upper chest: Negative. Other: None IMPRESSION: Mild degenerative changes.  No acute osseous abnormality. Electronically Signed   By: Donavan Foil M.D.   On: 04/30/2022 17:39   CT Head Wo Contrast  Result Date: 04/30/2022 CLINICAL DATA:  Trauma laceration EXAM: CT HEAD WITHOUT CONTRAST TECHNIQUE: Contiguous axial images were obtained from the base of the skull through the vertex without intravenous contrast. RADIATION DOSE REDUCTION: This exam was performed according to the departmental dose-optimization program which includes automated exposure control, adjustment of the mA and/or kV according to patient size and/or use of iterative reconstruction technique. COMPARISON:  CT brain 04/15/2022 FINDINGS: Brain: No acute territorial infarction, hemorrhage or intracranial mass. Atrophy. Moderate white matter hypodensity consistent with chronic small vessel disease. Stable ventricle size. Vascular: No hyperdense vessels.  Carotid vascular calcification. Skull: No depressed skull fracture.  Sinuses/Orbits: Moderate mucosal sinus disease. Other: None IMPRESSION: 1. No CT evidence for acute intracranial abnormality 2. Atrophy and chronic small vessel ischemic changes of the white matter Electronically Signed   By: Donavan Foil M.D.   On: 04/30/2022 17:35     Scheduled Meds: Continuous Infusions:  heparin 950 Units/hr (05/01/22 0022)     LOS: 1 day    Time spent: 38mn  PDomenic Polite MD [facility]   05/01/2022, 9:34 AM

## 2022-05-01 NOTE — Consult Note (Signed)
Palliative Medicine Inpatient Consult Note  Consulting Provider: Domenic Polite, MD  Reason for consult:   [facility] Palliative Medicine Consult  Reason for Consult? need help with comfort meds for DC w/ hospice tomorrow   05/01/2022  HPI:  Per intake H&P --> 89/M with history of stage IV non-Hodgkin's lymphoma of abdomen, chronic systolic CHF, EF 81-19%, paroxysmal A-fib on Eliquis and amiodarone, hypertension, dyslipidemia, hard of hearing presenting to the ED with weakness, multiple falls, low blood pressure.  Palliative care has been asked to get involved to discuss goals of care.  Clinical Assessment/Goals of Care:  *Please note that this is a verbal dictation therefore any spelling or grammatical errors are due to the "[employer]" system interpretation.  I have reviewed medical records including EPIC notes, labs and imaging, received report from bedside RN, assessed the patient.    I met with Grayden his son Shanon Brow and daughter in law Debbie to further discuss diagnosis prognosis, [place], EOL wishes, disposition and options.   I introduced Palliative Medicine as specialized medical care for people living with serious illness. It focuses on providing relief from the symptoms and stress of a serious illness. The goal is to improve quality of life for both the patient and the family.  Medical History Review and Understanding:  We reviewed Jermy's history of non-Hodgkin's lymphoma, heart failure, atrial fibrillation, hypertension, multiple falls.  Social History:  Karandeep is a widow has his wife passed away in 10-25-04 of sepsis.  Randen had 3 children though only has 1 living son.  Caryl's daughter passed away in front of him of a tramadol overdose.  Harvin' son was found dead on his couch thought to be related to alcohol abuse.  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  He is a man of faith and practices within Christianity.  Functional and Nutritional State:  Prior to admission [place] was still performing all basic activities of daily living and most instrumental activities of daily living.  Per his son he had recently-September 3 driven over to his home though when he drove back to his own home he got lost and a good Samaritan had to help him.  Advance Directives:  A detailed discussion was had today regarding advanced directives.  Patient only has 1 living son who is his Marine scientist.  Code Status:  Patient is established DNAR/DNI CODE STATUS.  Discussion:  Reviewed patient's present clinical condition in the setting of his disease burden inclusive of his heart block causing severe bradycardia, heart failure, renal failure, and liver failure.  We reviewed that per the conversation with the medical team the patient's son realizes that he could pass at any time.  David's goals for his father are that he is comfortable as he transitions from this life to the next.  He states that his father has recently been saved and although it is sad he knows where he is going and that they will be reunited again.  Provided information on comfort care. We talked about transition to comfort measures in house and what that would entail inclusive of medications to control pain, dyspnea, agitation, nausea, itching, and hiccups.  We discussed stopping all uneccessary measures such as cardiac monitoring, blood draws, needle sticks, and frequent vital signs.   Discussed that a chaplain would come by to offer  spiritual support.  Reviewed that Maclain loves old gospel music - I shared that we completed that in his room.  Patient's family are going to come in to visit and spend time with him.  Differences between home hospice and inpatient hospice were vocalized.  Patient's son shares that he would prefer inpatient  hospice as this would allow the 24/7 support of [place] and allow him to be his son  as opposed to his caregiver.  Shanon Brow also shares that his father never wanted to be a burden on anyone.  Reviewed that we will continue to watch Dennys over the next day or so and proceed with possible transition to inpatient hospice if he remains stable.  If he is not stable patient's son is well aware he could pass in the hospital.  Patient's family provided with a "gone from my sight" book.  Decision Maker: Dezmon, [place] (Son): [phone] ([place])  SUMMARY OF RECOMMENDATIONS   DNAR/DNI  Comfort care  Comfort medications per [facility]  Unrestricted visitation  [facility]- Inpatient hospice referral --> this will be completed tomorrow to allow time to manage patient's symptoms   Ongoing palliative support  Code Status/Advance Care Planning: DNAR/DNI  Palliative Prophylaxis:  Aspiration, Bowel Regimen, Delirium Protocol, Frequent Pain Assessment, Oral Care, Palliative Wound Care, and Turn Reposition  Additional Recommendations (Limitations, Scope, Preferences): Comfort care  Psycho-social/Spiritual:  Desire for further Chaplaincy support: Yes  Additional Recommendations: Reviewed patients acute disease processes   Prognosis: Limited days to weeks, inpatient hospice appropriate in the setting of MSOF  Discharge Planning: Possibly inpatient hospice.   Vitals:   05/01/22 0734 05/01/22 1052  BP: (!) 97/51 (!) 118/58  Pulse: (!) 44 (!) 50  Resp: 16 (!) 22  Temp: 97.6 F (36.4 C) (!) 97.5 F (36.4 C)  SpO2: 100% 100%    Intake/Output Summary (Last 24 hours) at 05/01/2022 1537 Last data filed at 05/01/2022 1054 Gross per 24 hour  Intake 393.72 ml  Output 425 ml  Net -31.28 ml   Last Weight  Most recent update: 04/30/2022 10:41 PM    Weight  62.8 kg (138 lb 7.2 oz)            Gen: Elderly Caucasian man in no acute distress HEENT: moist mucous membranes CV: Regular rate and irregular  rhythm PULM: On room air breathing is even and nonlabored ABD: soft/nontender EXT: No edema Neuro: Alert and oriented to self and place -gets confused during conversation  PPS: 30%   This conversation/these recommendations were discussed with patient primary care team, Dr. Broadus John  Billing based on MDM: High  Problems Addressed: One acute or chronic illness or injury that poses a threat to life or bodily function  Amount and/or Complexity of Data: Category 3:Discussion of management or test interpretation with external physician/other qualified health care professional/appropriate source (not separately reported)  Risks: Parenteral controlled substances, Decision regarding hospitalization or escalation of hospital care, and Decision not to resuscitate or to de-escalate care because of poor prognosis ______________________________________________________ [facility] Team Team Cell Phone: [phone] Please utilize secure chat with additional questions, if there is no response within 30 minutes please call the above phone number  Palliative Medicine Team providers are available by phone from 7am to 7pm daily and can be reached through the team cell phone.  Should this patient require assistance outside of these hours, please call the patient's attending physician.

## 2022-05-01 NOTE — Consult Note (Signed)
Cardiology Consultation   Patient ID: Henry Nichols MRN: [id number]; DOB: 12-24-1931  Admit date: 04/30/2022 Date of Consult: 05/01/2022  PCP:  [name], MD   [facility] Cardiologist:  Freada Bergeron, MD        Patient Profile:   Henry Nichols is a 88 y.o. male with a hx of HFrEF, left bundle branch block, A-fib now on amiodarone and Eliquis who is being seen 05/01/2022 for the evaluation of Falls in the setting of bradycardia at the request of emergency room physician.  History of Present Illness:   Henry Nichols presents to the hospital with episodes of falls.  No loss of consciousness.  Most of the episodes associated with position change patient is requesting patient denies episodes of bleeding.  Patient reports compliance with his medical regimen.  Initial EKG at the outside hospital shows sinus bradycardia with PVCs and intermittent 2-1 AV block subsequent EKG showed sinus bradycardia at 50s with left bundle branch block.  Baseline.  Patient is noted to be well perfused, hemodynamically stable.  Multiple electrolyte derangements noted including hypokalemia 3.4.  Markedly elevated LFTs, evidence of CKD stage IV.  Troponin of 132 probably in the setting of CKD.   Past Medical History:  Diagnosis Date   Coronary artery calcification 08/03/2017   Essential hypertension 08/03/2017   GERD (gastroesophageal reflux disease)    HOH (hard of hearing)    Hyperlipidemia 08/03/2017   Hypertension    LBBB (left bundle branch block) 08/03/2017   Non-Hodgkin lymphoma ([place]) 08/03/2017   Pneumonia    hx   Small bowel mass    Varicose vein of leg 08/03/2017    Past Surgical History:  Procedure Laterality Date   CARDIOVERSION N/A 02/12/2022   Procedure: CARDIOVERSION;  Surgeon: Elouise Munroe, MD;  Location: [place];  Service: Cardiovascular;  Laterality: N/A;   EYE SURGERY Bilateral 2016   cataracts   HERNIA REPAIR Bilateral 2008    inguinal   LAPAROSCOPIC SMALL BOWEL RESECTION  07/06/2017   LAPAROSCOPIC SMALL BOWEL RESECTION N/A 07/06/2017   Procedure: LAPAROSCOPIC ASSISTED SMALL BOWEL RESECTION;  Surgeon: Coralie Keens, MD;  Location: [place];  Service: General;  Laterality: N/A;   TEE WITHOUT CARDIOVERSION N/A 02/12/2022   Procedure: TRANSESOPHAGEAL ECHOCARDIOGRAM (TEE);  Surgeon: Elouise Munroe, MD;  Location: [facility] ENDOSCOPY;  Service: Cardiovascular;  Laterality: N/A;     Home Medications:  Prior to Admission medications   Medication Sig Start Date End Date Taking? Authorizing Provider  acetaminophen (TYLENOL) 325 MG tablet Take 2 tablets (650 mg total) by mouth every 6 (six) hours as needed for mild pain or moderate pain. 07/09/17   Jill Alexanders, PA-C  albuterol (PROVENTIL HFA;VENTOLIN HFA) 108 (90 Base) MCG/ACT inhaler Inhale 1 puff every 6 (six) hours as needed into the lungs for wheezing or shortness of breath.    [provider]  amiodarone (PACERONE) 200 MG tablet Take 1 tablet (200 mg total) by mouth daily. 03/22/22   Fenton, Clint R, PA  apixaban (ELIQUIS) 5 MG TABS tablet Take 1 tablet (5 mg total) by mouth 2 (two) times daily. 03/17/22 04/16/22  Freada Bergeron, MD  atorvastatin (LIPITOR) 80 MG tablet Take 1 tablet (80 mg total) by mouth daily. 03/17/22   Freada Bergeron, MD  furosemide (LASIX) 20 MG tablet Take 1 tablet (20 mg total) by mouth daily. 03/17/22   Freada Bergeron, MD  lisinopril (ZESTRIL) 5 MG tablet Take 1 tablet (5 mg  total) by mouth daily. Patient not taking: Reported on 04/26/2022 03/17/22   Freada Bergeron, MD  metoprolol tartrate (LOPRESSOR) 25 MG tablet Take 0.5 tablets (12.5 mg total) by mouth 2 (two) times daily. 03/17/22   Freada Bergeron, MD  triamcinolone cream (KENALOG) 0.1 % Apply 1 application daily as needed topically (for dry skin).    [provider]    Inpatient Medications: Scheduled Meds:  Continuous Infusions:  heparin 950  Units/hr (05/01/22 0022)   PRN Meds: acetaminophen **OR** acetaminophen, ondansetron **OR** ondansetron (ZOFRAN) IV  Allergies:   No Known Allergies  Social History:   Social History   Socioeconomic History   Marital status: Widowed    Spouse name: Not on file   Number of children: Not on file   Years of education: Not on file   Highest education level: Not on file  Occupational History   Not on file  Tobacco Use   Smoking status: Former    Types: Cigars    Quit date: 06/29/1984    Years since quitting: 37.8   Smokeless tobacco: Never  Vaping Use   Vaping Use: Never used  Substance and Sexual Activity   Alcohol use: No   Drug use: No   Sexual activity: Not on file  Other Topics Concern   Not on file  Social History Narrative   Not on file   Social Determinants of Health   Financial Resource Strain: Not on file  Food Insecurity: No Food Insecurity (04/30/2022)   Hunger Vital Sign    Worried About Running Out of Food in the Last Year: Never true    Ran Out of Food in the Last Year: Never true  Transportation Needs: No Transportation Needs (04/30/2022)   PRAPARE - Hydrologist (Medical): No    Lack of Transportation (Non-Medical): No  Physical Activity: Not on file  Stress: Not on file  Social Connections: Not on file  Intimate Partner Violence: Not At Risk (04/30/2022)   Humiliation, Afraid, Rape, and Kick questionnaire    Fear of Current or Ex-Partner: No    Emotionally Abused: No    Physically Abused: No    Sexually Abused: No    Family History:    Family History  Problem Relation Age of Onset   Alzheimer's disease Mother    Heart disease Mother    Heart attack Father      ROS:  Please see the history of present illness.   All other ROS reviewed and negative.     Physical Exam/Data:   Vitals:   04/30/22 2011 04/30/22 2100 04/30/22 2236 04/30/22 2354  BP:  (!) 134/59 135/68 (!) 150/61  Pulse: 70 66  (!) 46  Resp: '17  13 19 16  '$ Temp:   (!) 96.2 F (35.7 C) 97.7 F (36.5 C)  TempSrc:   Axillary Oral  SpO2: (!) 30% 100% 100% 100%  Weight:   62.8 kg   Height:   '5\' 10"'$  (1.778 m)     Intake/Output Summary (Last 24 hours) at 05/01/2022 0216 Last data filed at 04/30/2022 2355 Gross per 24 hour  Intake 360 ml  Output 125 ml  Net 235 ml      04/30/2022   10:36 PM 04/30/2022    4:31 PM 04/26/2022   12:43 PM  Last 3 Weights  Weight (lbs) 138 lb 7.2 oz 141 lb 8 oz 141 lb 8 oz  Weight (kg) 62.8 kg 64.184 kg 64.184 kg  Body mass index is 19.87 kg/m.  General:  Well nourished, well developed, in no acute distress HEENT: normal Neck: JVD 6 cm at 45 degrees Vascular: No carotid bruits; Distal pulses 2+ bilaterally Cardiac:  normal S1, fixed split S2; RRR; no murmur  Lungs:  clear to auscultation bilaterally, mild wheezing, rhonchi or rales  Abd: soft, nontender, no hepatomegaly  Ext: no edema Musculoskeletal:  No deformities, BUE and BLE strength normal and equal Skin: warm and dry  Neuro:  CNs 2-12 intact, no focal abnormalities noted Psych:  Normal affect   EKG:  The EKG was personally reviewed and demonstrates: Sinus bradycardia with left bundle branch block.  Junctional rhythm with PVCs probably in setting of complete heart block Telemetry:  Telemetry was personally reviewed and demonstrates: Now shows episodes of Wenckebach with baseline sinus rhythm.  I did not appreciate any Mobitz 2 or complete heart block  Relevant CV Studies: TEE 01/2022 1. Left atrial size was moderately dilated. No left atrial/left atrial  appendage thrombus was detected. The LAA emptying velocity was 32 cm/s.   2. Right ventricular systolic function is mildly reduced. The right  ventricular size is normal.   3. Right atrial size was moderately dilated.   4. The mitral valve is degenerative. Trivial mitral valve regurgitation.   5. Tricuspid valve regurgitation is mild to moderate.   6. The aortic valve is grossly  normal. Aortic valve regurgitation is  mild.   7. There is mild (Grade II) atheroma plaque involving the aortic arch and  descending aorta.   8. Limited study due to poor image quality.   Conclusion(s)/Recommendation(s): No LA/LAA thrombus identified. Successful  cardioversion performed with restoration of normal sinus rhythm.   TTE 01/2022 1. There is severe LBBB-related systolic dyssynchrony. Left ventricular  ejection fraction, by estimation, is 30 to 35%. The left ventricle has  moderately decreased function. The left ventricle demonstrates global  hypokinesis. There is mild concentric  left ventricular hypertrophy. Left ventricular diastolic function could  not be evaluated.   2. Right ventricular systolic function is moderately reduced. The right  ventricular size is normal. There is mildly elevated pulmonary artery  systolic pressure. The estimated right ventricular systolic pressure is  49.7 mmHg.   3. Left atrial size was mild to moderately dilated.   4. Right atrial size was moderately dilated.   5. The mitral valve is normal in structure. Mild mitral valve  regurgitation. No evidence of mitral stenosis.   6. Tricuspid valve regurgitation is moderate.   7. The aortic valve is tricuspid. Aortic valve regurgitation is mild. No  aortic stenosis is present.   8. There is mild dilatation of the ascending aorta, measuring 38 mm.   9. The inferior vena cava is dilated in size with <50% respiratory  variability, suggesting right atrial pressure of 15 mmHg.   Laboratory Data:  High Sensitivity Troponin:   Recent Labs  Lab 04/30/22 1932  TROPONINIHS 132*     Chemistry Recent Labs  Lab 04/26/22 1206 04/30/22 1932 05/01/22 0037  NA 135 140 136  K 3.8 3.7 3.4*  CL 102 109 104  CO2 26 24 21*  GLUCOSE 217* 102* 112*  BUN 37* 49* 47*  CREATININE 2.56* 3.29* 3.17*  CALCIUM 9.1 8.6* 8.1*  MG  --  2.4  --   GFRNONAA 24* 17* 18*  ANIONGAP '7 7 11    '$ Recent Labs  Lab  04/26/22 1206 05/01/22 0037  PROT 6.3* 5.1*  ALBUMIN 3.4* 2.4*  AST 353* 346*  ALT 265* 304*  ALKPHOS 76 68  BILITOT 2.0* 1.6*   Lipids No results for input(s): "CHOL", "TRIG", "HDL", "LABVLDL", "LDLCALC", "CHOLHDL" in the last 168 hours.  Hematology Recent Labs  Lab 04/26/22 1206 04/30/22 1932 05/01/22 0037  WBC 8.9 6.7 5.4  RBC 3.67* 3.40* 3.24*  HGB 11.3* 10.9* 10.0*  HCT 34.4* 32.4* 30.5*  MCV 93.7 95.3 94.1  MCH 30.8 32.1 30.9  MCHC 32.8 33.6 32.8  RDW 17.1* 17.7* 17.7*  PLT 244 207 188   Thyroid No results for input(s): "TSH", "FREET4" in the last 168 hours.  BNP Recent Labs  Lab 04/30/22 1932  BNP 821.2*    DDimer No results for input(s): "DDIMER" in the last 168 hours.   Radiology/Studies:  DG Chest Portable 1 View  Result Date: 04/30/2022 CLINICAL DATA:  Dyspnea chest pain EXAM: PORTABLE CHEST 1 VIEW COMPARISON:  04/15/2022, CT 03/31/2022 FINDINGS: Small right-sided pleural effusion without significant change. Cardiomegaly. Aortic atherosclerosis. IMPRESSION: Small right-sided pleural effusion without significant change. Cardiomegaly Electronically Signed   By: Donavan Foil M.D.   On: 04/30/2022 17:40   CT Cervical Spine Wo Contrast  Result Date: 04/30/2022 CLINICAL DATA:  Trauma fall EXAM: CT CERVICAL SPINE WITHOUT CONTRAST TECHNIQUE: Multidetector CT imaging of the cervical spine was performed without intravenous contrast. Multiplanar CT image reconstructions were also generated. RADIATION DOSE REDUCTION: This exam was performed according to the departmental dose-optimization program which includes automated exposure control, adjustment of the mA and/or kV according to patient size and/or use of iterative reconstruction technique. COMPARISON:  CT 04/15/2022 FINDINGS: Alignment: No subluxation.  Facet alignment within normal limits. Skull base and vertebrae: No acute fracture. No primary bone lesion or focal pathologic process. Soft tissues and spinal canal: No  prevertebral fluid or swelling. No visible canal hematoma. Disc levels: Mild disc space narrowing C6-C7 and C7-T1. Facet degenerative changes at multiple levels Upper chest: Negative. Other: None IMPRESSION: Mild degenerative changes.  No acute osseous abnormality. Electronically Signed   By: Donavan Foil M.D.   On: 04/30/2022 17:39   CT Head Wo Contrast  Result Date: 04/30/2022 CLINICAL DATA:  Trauma laceration EXAM: CT HEAD WITHOUT CONTRAST TECHNIQUE: Contiguous axial images were obtained from the base of the skull through the vertex without intravenous contrast. RADIATION DOSE REDUCTION: This exam was performed according to the departmental dose-optimization program which includes automated exposure control, adjustment of the mA and/or kV according to patient size and/or use of iterative reconstruction technique. COMPARISON:  CT brain 04/15/2022 FINDINGS: Brain: No acute territorial infarction, hemorrhage or intracranial mass. Atrophy. Moderate white matter hypodensity consistent with chronic small vessel disease. Stable ventricle size. Vascular: No hyperdense vessels.  Carotid vascular calcification. Skull: No depressed skull fracture. Sinuses/Orbits: Moderate mucosal sinus disease. Other: None IMPRESSION: 1. No CT evidence for acute intracranial abnormality 2. Atrophy and chronic small vessel ischemic changes of the white matter Electronically Signed   By: Donavan Foil M.D.   On: 04/30/2022 17:35     Assessment and Plan:   Falls without syncope.  Based on patient description this is most likely representing orthostatic episodes.  I doubt significant contribution of bradycardia to this results as patient is warm and well-perfused with heart rate in the high 20s.  Most likely patient has nodal and infranodal disease. I do believe that we should decrease the dose of beta-blockers.  Significant transaminitis noted I doubt this is related to amiodarone as it was recently initiated on in July 2023.  We  will recommend to decrease Amio dose to 100 mg p.o. daily.  Please ambulate the patient's to assess chronotropic competence.  PT OT eval for possible rehab placement.  Significant limited in terms of antiarrhythmic therapies given significant renal dysfunction and significant cardiomyopathy in the setting of known A-fib with RVR.  Serial EKGs.  Keep potassium of 4, magnesium of 2.  No need for transcutaneous pacing, no need for temporary wire pacing at this time. Continuous telemetry. A-fib RVR now rate controlled with Amio, Toprol and Eliquis.CHA2DS2-VASc score of 5 has bled of 1.  Decrease dose of Amio, decrease metoprolol to 1 dose of metoprolol succinate 12.5 mg daily, transition Eliquis to Lovenox.  HFrEF probably in the setting of significant dyssynchrony due to labile branch block.  Prolonged conversation about risks and benefits.  In general this patient may benefit from blood bundle area pacing with or without AV nodal ablation.  Would only reserve this option of patient is fairly active and has recent worsening of symptoms. still unclear to me if this is the case.  On exam patient is warm and well-perfused, euvolemic. Elevated troponin, most likely in setting of chronic CKD. No evidence of ACS Transaminitis without Amio toxicity, no evidence of shock and poor perfusion.  Query liver involvement given none Hodgkin's malignancy AKI on CKD work-up and treatment as per primary team     Risk Assessment/Risk Scores:                For questions or updates, please contact [place] Please consult [email or web address] for contact info under    Signed, Warren Danes, MD  05/01/2022 2:16 AM

## 2022-05-01 NOTE — Progress Notes (Signed)
Rounding Note    Patient Name: Henry Nichols Date of Encounter: 05/01/2022  [place] Cardiologist: Freada Bergeron, MD   Subjective   Alert, oriented, but feels weak.  Remains markedly bradycardic, mostly in the 40 bpm range, occasionally dipping down to 29 bpm. Has periods of sinus bradycardia alternating with junctional escape rhythm.  Chronic LBBB.  Inpatient Medications    Scheduled Meds:  apixaban  2.5 mg Oral BID   Continuous Infusions:  PRN Meds: acetaminophen **OR** acetaminophen, ondansetron **OR** ondansetron (ZOFRAN) IV   Vital Signs    Vitals:   05/01/22 0000 05/01/22 0407 05/01/22 0734 05/01/22 1052  BP: (!) 146/64 (!) 112/56 (!) 97/51 (!) 118/58  Pulse: (!) 47 (!) 47 (!) 44 (!) 50  Resp: '14 17 16 '$ (!) 22  Temp:  97.6 F (36.4 C) 97.6 F (36.4 C) (!) 97.5 F (36.4 C)  TempSrc:  Oral Oral Oral  SpO2: 100% 100% 100% 100%  Weight:      Height:        Intake/Output Summary (Last 24 hours) at 05/01/2022 1145 Last data filed at 05/01/2022 1054 Gross per 24 hour  Intake 393.72 ml  Output 425 ml  Net -31.28 ml      04/30/2022   10:36 PM 04/30/2022    4:31 PM 04/26/2022   12:43 PM  Last 3 Weights  Weight (lbs) 138 lb 7.2 oz 141 lb 8 oz 141 lb 8 oz  Weight (kg) 62.8 kg 64.184 kg 64.184 kg      Telemetry    Severe sinus bradycardia alternating with periods of sinus arrest and junctional escape rhythm, QRS complex is wide chronically.- Personally Reviewed  ECG    ECG from 04/20/2022 shows sinus bradycardia with first-degree AV block and LBBB; yesterday tracings show junctional rhythm with severe bradycardia 25 bpm, LBBB- Personally Reviewed  Physical Exam  Appears weak and frail. GEN: No acute distress.  Periorbital ecchymosis on the right side Neck: No JVD Cardiac: RRR and bradycardic, with periods of irregularity, paradoxically split second heart sound, intermittently heard systolic ejection murmur, early peaking in the aortic  focus, no diastolic murmurs, rubs, or gallops.  Respiratory: Clear to auscultation bilaterally. GI: Soft, nontender, non-distended  MS: No edema; No deformity. Neuro:  Nonfocal  Psych: Normal affect   Labs    High Sensitivity Troponin:   Recent Labs  Lab 04/30/22 1932 05/01/22 0037  TROPONINIHS 132* 122*     Chemistry Recent Labs  Lab 04/26/22 1206 04/30/22 1932 05/01/22 0037  NA 135 140 136  K 3.8 3.7 3.4*  CL 102 109 104  CO2 26 24 21*  GLUCOSE 217* 102* 112*  BUN 37* 49* 47*  CREATININE 2.56* 3.29* 3.17*  CALCIUM 9.1 8.6* 8.1*  MG  --  2.4  --   PROT 6.3*  --  5.0*  5.1*  ALBUMIN 3.4*  --  2.5*  2.4*  AST 353*  --  346*  346*  ALT 265*  --  309*  304*  ALKPHOS 76  --  67  68  BILITOT 2.0*  --  1.6*  1.6*  GFRNONAA 24* 17* 18*  ANIONGAP '7 7 11    '$ Lipids No results for input(s): "CHOL", "TRIG", "HDL", "LABVLDL", "LDLCALC", "CHOLHDL" in the last 168 hours.  Hematology Recent Labs  Lab 04/26/22 1206 04/30/22 1932 05/01/22 0037  WBC 8.9 6.7 5.4  RBC 3.67* 3.40* 3.24*  HGB 11.3* 10.9* 10.0*  HCT 34.4* 32.4* 30.5*  MCV 93.7  95.3 94.1  MCH 30.8 32.1 30.9  MCHC 32.8 33.6 32.8  RDW 17.1* 17.7* 17.7*  PLT 244 207 188   Thyroid No results for input(s): "TSH", "FREET4" in the last 168 hours.  BNP Recent Labs  Lab 04/30/22 1932  BNP 821.2*    DDimer No results for input(s): "DDIMER" in the last 168 hours.   Radiology    US RENAL  Result Date: 05/01/2022 CLINICAL DATA:  Renal dysfunction EXAM: RENAL / URINARY TRACT ULTRASOUND COMPLETE COMPARISON:  None Available. FINDINGS: Right Kidney: Renal measurements: 8.7 x 4.3 x 5.4 cm = volume: 106.4 mL. There is no hydronephrosis. There is increased cortical echogenicity. Left Kidney: Renal measurements: Left kidney measures 9.5 x 5.4 by 5.2 cm = volume: 139.3 mL. There is no hydronephrosis. There is a lobulated cyst in the upper pole of left kidney measuring 5.1 cm in maximum diameter. There is slightly  increased cortical echogenicity. There is minimal perinephric fluid. Bladder: Urinary bladder is not distended. There is hyperechoic focus in the upper margin of prostate, possibly calcification in the prostate. Other: There is moderate to large right pleural effusion. IMPRESSION: There is no hydronephrosis. Increased cortical echogenicity may suggest medical renal disease. There is 5.1 cm left renal cyst. Moderate to large right pleural effusion. Electronically Signed   By: Elmer Picker M.D.   On: 05/01/2022 08:51   DG Chest Portable 1 View  Result Date: 04/30/2022 CLINICAL DATA:  Dyspnea chest pain EXAM: PORTABLE CHEST 1 VIEW COMPARISON:  04/15/2022, CT 03/31/2022 FINDINGS: Small right-sided pleural effusion without significant change. Cardiomegaly. Aortic atherosclerosis. IMPRESSION: Small right-sided pleural effusion without significant change. Cardiomegaly Electronically Signed   By: Donavan Foil M.D.   On: 04/30/2022 17:40   CT Cervical Spine Wo Contrast  Result Date: 04/30/2022 CLINICAL DATA:  Trauma fall EXAM: CT CERVICAL SPINE WITHOUT CONTRAST TECHNIQUE: Multidetector CT imaging of the cervical spine was performed without intravenous contrast. Multiplanar CT image reconstructions were also generated. RADIATION DOSE REDUCTION: This exam was performed according to the departmental dose-optimization program which includes automated exposure control, adjustment of the mA and/or kV according to patient size and/or use of iterative reconstruction technique. COMPARISON:  CT 04/15/2022 FINDINGS: Alignment: No subluxation.  Facet alignment within normal limits. Skull base and vertebrae: No acute fracture. No primary bone lesion or focal pathologic process. Soft tissues and spinal canal: No prevertebral fluid or swelling. No visible canal hematoma. Disc levels: Mild disc space narrowing C6-C7 and C7-T1. Facet degenerative changes at multiple levels Upper chest: Negative. Other: None IMPRESSION: Mild  degenerative changes.  No acute osseous abnormality. Electronically Signed   By: Donavan Foil M.D.   On: 04/30/2022 17:39   CT Head Wo Contrast  Result Date: 04/30/2022 CLINICAL DATA:  Trauma laceration EXAM: CT HEAD WITHOUT CONTRAST TECHNIQUE: Contiguous axial images were obtained from the base of the skull through the vertex without intravenous contrast. RADIATION DOSE REDUCTION: This exam was performed according to the departmental dose-optimization program which includes automated exposure control, adjustment of the mA and/or kV according to patient size and/or use of iterative reconstruction technique. COMPARISON:  CT brain 04/15/2022 FINDINGS: Brain: No acute territorial infarction, hemorrhage or intracranial mass. Atrophy. Moderate white matter hypodensity consistent with chronic small vessel disease. Stable ventricle size. Vascular: No hyperdense vessels.  Carotid vascular calcification. Skull: No depressed skull fracture. Sinuses/Orbits: Moderate mucosal sinus disease. Other: None IMPRESSION: 1. No CT evidence for acute intracranial abnormality 2. Atrophy and chronic small vessel ischemic changes of the white matter  Electronically Signed   By: Donavan Foil M.D.   On: 04/30/2022 17:35    Cardiac Studies   TEE/cardioversion 02/12/2022    1. Left atrial size was moderately dilated. No left atrial/left atrial  appendage thrombus was detected. The LAA emptying velocity was 32 cm/s.   2. Right ventricular systolic function is mildly reduced. The right  ventricular size is normal.   3. Right atrial size was moderately dilated.   4. The mitral valve is degenerative. Trivial mitral valve regurgitation.   5. Tricuspid valve regurgitation is mild to moderate.   6. The aortic valve is grossly normal. Aortic valve regurgitation is  mild.   7. There is mild (Grade II) atheroma plaque involving the aortic arch and  descending aorta.   8. Limited study due to poor image quality.    Conclusion(s)/Recommendation(s): No LA/LAA thrombus identified. Successful  cardioversion performed with restoration of normal sinus rhythm.    Transthoracic echocardiogram 02/09/2022   1. There is severe LBBB-related systolic dyssynchrony. Left ventricular  ejection fraction, by estimation, is 30 to 35%. The left ventricle has  moderately decreased function. The left ventricle demonstrates global  hypokinesis. There is mild concentric  left ventricular hypertrophy. Left ventricular diastolic function could  not be evaluated.   2. Right ventricular systolic function is moderately reduced. The right  ventricular size is normal. There is mildly elevated pulmonary artery  systolic pressure. The estimated right ventricular systolic pressure is  16.1 mmHg.   3. Left atrial size was mild to moderately dilated.   4. Right atrial size was moderately dilated.   5. The mitral valve is normal in structure. Mild mitral valve  regurgitation. No evidence of mitral stenosis.   6. Tricuspid valve regurgitation is moderate.   7. The aortic valve is tricuspid. Aortic valve regurgitation is mild. No  aortic stenosis is present.   8. There is mild dilatation of the ascending aorta, measuring 38 mm.   9. The inferior vena cava is dilated in size with <50% respiratory  variability, suggesting right atrial pressure of 15 mmHg.  Patient Profile     88 y.o. male with longstanding nonischemic cardiomyopathy (EF 30-35%, LBBB related dyssynchrony), persistent atrial fibrillation status post cardioversion June 2023, on chronic amiodarone and oral anticoagulant therapy presents with recurrent falls and severe weakness due to severe symptomatic bradycardia due to both sinus bradycardia and intermittent second-degree AV block Mobitz type II with junctional escape rhythm.  Additional problems include acute on chronic CKD stage IV, shock liver/transaminitis suggestive of low cardiac output, stage IV non-Hodgkin's  lymphoma with recent CT evidence of enlarging mesenteric lymphadenopathy  Assessment & Plan    Amiodarone and metoprolol have been stopped.  Suspect that his worsening liver and kidney abnormalities are due to low cardiac output.  Having said that, chronic kidney disease precedes the acute hemodynamic abnormalities.  His functional status is very poor, he is quite elderly, he has numerous comorbid conditions and has evidence of progression of lymphoma.  Unclear if his poor functional status is exclusively due to bradycardia, since his heart rate was 90 bpm when he had his most recent oncology clinic evaluation just a few days ago.  Options for further care include implantation of a cardiac resynchronization therapy pacemaker (biventricular pacemaker) or palliative care.  We reviewed the procedure for implanting a biventricular pacemaker and the possible complications, expected benefit, etc.  Had a lengthy discussion with the patient, his son Shanon Brow and his daughter-in-law Jackelyn Poling.  My first impression is  that all are more inclined to pursue a conservative approach, possibly with home hospice.  They are still talking it over amongst themselves.  If they decide to pursue more aggressive route, would consult EP for evaluation for CRT-P.     For questions or updates, please contact [facility] Please consult [email or web address] for contact info under        Signed, Sanda Klein, MD  05/01/2022, 11:45 AM

## 2022-05-01 NOTE — Progress Notes (Signed)
Addendum: Dr.Croitoru and myself, d/w pts son at bedside -Discussed poor prognosis with severe bradycardia, heart block, CHF, now low output heart failure with worsening renal failure, liver failure in the background of progressive stage IV non-Hodgkin's lymphoma -Son would like to focus on his symptoms, comfort and possibly take him home tomorrow with hospice. -Will request palliative care eval and to assist with comfort meds -DC IV heparin, resume Eliquis, no procedures planned  Domenic Polite MD

## 2022-05-01 NOTE — Plan of Care (Signed)

## 2022-05-02 DIAGNOSIS — R001 Bradycardia, unspecified: Secondary | ICD-10-CM | POA: Diagnosis not present

## 2022-05-02 DIAGNOSIS — Z515 Encounter for palliative care: Secondary | ICD-10-CM | POA: Diagnosis not present

## 2022-05-02 MED ORDER — MORPHINE SULFATE (CONCENTRATE) 10 MG /0.5 ML PO SOLN: 5.0000 mg | ORAL | 0 refills | Status: AC | PRN

## 2022-05-02 NOTE — Progress Notes (Addendum)
Report given to [place] [facility]. Pt will be discharged with Pherphrial IV remaining in arm per hospice facility.

## 2022-05-02 NOTE — Discharge Summary (Signed)
Physician Discharge Summary  Henry Nichols VVO:[id number] DOB: [birth year] DOA: 04/30/2022  PCP: [name], MD  Admit date: 04/30/2022 Discharge date: 05/02/2022  Time spent: 35 minutes  Recommendations for Outpatient Follow-up:  Residential Hospice  Discharge Diagnoses:  Principal Problem:   Bradycardia with less than 30 beats per minute   Low output heart failure   Chronic systolic CHF   Acute kidney injury superimposed on chronic kidney disease ([place])   Transaminitis   Syncope   Non-Hodgkin lymphoma ([place])   Essential hypertension   Hyperlipidemia   PAF (paroxysmal atrial fibrillation) ([place])   Discharge Condition: guarded  Diet recommendation: comfort feeds  Filed Weights   04/30/22 1631 04/30/22 2236  Weight: 64.2 kg 62.8 kg    History of present illness:  89/M with history of stage IV non-Hodgkin's lymphoma of abdomen, chronic systolic CHF, EF 26-94%, paroxysmal A-fib on Eliquis and amiodarone, hypertension, dyslipidemia, hard of hearing presenting to the ED with weakness, multiple falls, low blood pressure. -In the ED he was hypoxic with O2 sats of 86% on room air, ED noted to have a bruise, right periorbital and small laceration. -In the ED bradycardic as low as 20s, heart rate fluctuating in the 30-40 range, blood pressure fluctuating as well.  Labs notable for worsening kidney function, with creatinine of 3.2, baseline around 1.4, also noted to have normal LFTs, AST ALT in the 300s, bili of 1.6, alk phos is normal  Hospital Course:   Profound sinus bradycardia, heart block Low output state/failure -Held metoprolol and amiodarone -Cardiology consulting, felt to be a poor candidate for advanced therapies, PPM, ?inotropes considering advanced age, progressive lymphoma, d/w Cards, then seen by palliative care yesterday, now plan for residential hospice and comfort focused care    AKI on CKD 3a -Baseline creatinine around 1.4, creatinine up to 3.4 on  admission - secondary to above, low output state, worsened by lisinopril -renal ultrasound ruled out obstruction or hydronephrosis -now comfort care   Abnormal LFTs/transaminitis -Alk phos is normal ruling out obstructive physiology -CT abdomen with contrast last month did not indicate evidence of hepatic involvement(infiltration of lymphoma) - secondary to low output state, mild shock liver, await cardiology input -Amiodarone on hold   Stage IV Non-Hodgkin lymphoma ([place]) NHL with recurrent / progressive disease in inferior mesentery according to CT scan last month. -Followed by Dr.Kale -Patient reports 40 pound weight loss,, LDH has gone up 4X in the last 6 months, indicating recurrence, further corroborated by CT last month -seen by Oncology, felt to be a poor candidate for further Rx   PAF (paroxysmal atrial fibrillation) (HCC) Holding amiodarone and metoprolol as above -eliquis resumed   Hyperlipidemia -held statin    Essential hypertension Hold metoprolol due to severe bradycardia. Hold lisinopril, Lasix    Discharge Exam: Vitals:   05/02/22 0624 05/02/22 1218  BP: (!) 124/50 (!) 87/44  Pulse: (!) 41 (!) 43  Resp: (!) 21   Temp: 98.2 F (36.8 C) 98.1 F (36.7 C)  SpO2: 94%    Gen: somnolent, AAOx1 S1S2/RRR bradycardic LUng: poor air movement  Discharge Instructions    Allergies as of 05/02/2022   No Known Allergies      Medication List     STOP taking these medications    albuterol 108 (90 Base) MCG/ACT inhaler Commonly known as: VENTOLIN HFA   amiodarone 200 MG tablet Commonly known as: PACERONE   apixaban 5 MG Tabs tablet Commonly known as: ELIQUIS   atorvastatin 80 MG tablet Commonly  known as: LIPITOR   furosemide 20 MG tablet Commonly known as: LASIX   lisinopril 5 MG tablet Commonly known as: ZESTRIL   metoprolol tartrate 25 MG tablet Commonly known as: LOPRESSOR   triamcinolone cream 0.1 % Commonly known as: KENALOG        TAKE these medications    morphine CONCENTRATE 10 mg / 0.5 ml concentrated solution Take 0.25 mLs (5 mg total) by mouth every 3 (three) hours as needed for severe pain or shortness of breath.       No Known Allergies    The results of significant diagnostics from this hospitalization (including imaging, microbiology, ancillary and laboratory) are listed below for reference.    Significant Diagnostic Studies: US RENAL  Result Date: 05/01/2022 CLINICAL DATA:  Renal dysfunction EXAM: RENAL / URINARY TRACT ULTRASOUND COMPLETE COMPARISON:  None Available. FINDINGS: Right Kidney: Renal measurements: 8.7 x 4.3 x 5.4 cm = volume: 106.4 mL. There is no hydronephrosis. There is increased cortical echogenicity. Left Kidney: Renal measurements: Left kidney measures 9.5 x 5.4 by 5.2 cm = volume: 139.3 mL. There is no hydronephrosis. There is a lobulated cyst in the upper pole of left kidney measuring 5.1 cm in maximum diameter. There is slightly increased cortical echogenicity. There is minimal perinephric fluid. Bladder: Urinary bladder is not distended. There is hyperechoic focus in the upper margin of prostate, possibly calcification in the prostate. Other: There is moderate to large right pleural effusion. IMPRESSION: There is no hydronephrosis. Increased cortical echogenicity may suggest medical renal disease. There is 5.1 cm left renal cyst. Moderate to large right pleural effusion. Electronically Signed   By: Elmer Picker M.D.   On: 05/01/2022 08:51   DG Chest Portable 1 View  Result Date: 04/30/2022 CLINICAL DATA:  Dyspnea chest pain EXAM: PORTABLE CHEST 1 VIEW COMPARISON:  04/15/2022, CT 03/31/2022 FINDINGS: Small right-sided pleural effusion without significant change. Cardiomegaly. Aortic atherosclerosis. IMPRESSION: Small right-sided pleural effusion without significant change. Cardiomegaly Electronically Signed   By: Donavan Foil M.D.   On: 04/30/2022 17:40   CT Cervical Spine Wo  Contrast  Result Date: 04/30/2022 CLINICAL DATA:  Trauma fall EXAM: CT CERVICAL SPINE WITHOUT CONTRAST TECHNIQUE: Multidetector CT imaging of the cervical spine was performed without intravenous contrast. Multiplanar CT image reconstructions were also generated. RADIATION DOSE REDUCTION: This exam was performed according to the departmental dose-optimization program which includes automated exposure control, adjustment of the mA and/or kV according to patient size and/or use of iterative reconstruction technique. COMPARISON:  CT 04/15/2022 FINDINGS: Alignment: No subluxation.  Facet alignment within normal limits. Skull base and vertebrae: No acute fracture. No primary bone lesion or focal pathologic process. Soft tissues and spinal canal: No prevertebral fluid or swelling. No visible canal hematoma. Disc levels: Mild disc space narrowing C6-C7 and C7-T1. Facet degenerative changes at multiple levels Upper chest: Negative. Other: None IMPRESSION: Mild degenerative changes.  No acute osseous abnormality. Electronically Signed   By: Donavan Foil M.D.   On: 04/30/2022 17:39   CT Head Wo Contrast  Result Date: 04/30/2022 CLINICAL DATA:  Trauma laceration EXAM: CT HEAD WITHOUT CONTRAST TECHNIQUE: Contiguous axial images were obtained from the base of the skull through the vertex without intravenous contrast. RADIATION DOSE REDUCTION: This exam was performed according to the departmental dose-optimization program which includes automated exposure control, adjustment of the mA and/or kV according to patient size and/or use of iterative reconstruction technique. COMPARISON:  CT brain 04/15/2022 FINDINGS: Brain: No acute territorial infarction, hemorrhage or intracranial  mass. Atrophy. Moderate white matter hypodensity consistent with chronic small vessel disease. Stable ventricle size. Vascular: No hyperdense vessels.  Carotid vascular calcification. Skull: No depressed skull fracture. Sinuses/Orbits: Moderate  mucosal sinus disease. Other: None IMPRESSION: 1. No CT evidence for acute intracranial abnormality 2. Atrophy and chronic small vessel ischemic changes of the white matter Electronically Signed   By: Donavan Foil M.D.   On: 04/30/2022 17:35   CT Cervical Spine Wo Contrast  Result Date: 04/15/2022 CLINICAL DATA:  Neck trauma. EXAM: CT CERVICAL SPINE WITHOUT CONTRAST TECHNIQUE: Multidetector CT imaging of the cervical spine was performed without intravenous contrast. Multiplanar CT image reconstructions were also generated. RADIATION DOSE REDUCTION: This exam was performed according to the departmental dose-optimization program which includes automated exposure control, adjustment of the mA and/or kV according to patient size and/or use of iterative reconstruction technique. COMPARISON:  None Available. FINDINGS: Alignment: There is 2 mm of retrolisthesis at C3-C4 which is favored is degenerative. Alignment is otherwise anatomic. Skull base and vertebrae: No acute fracture. No primary bone lesion or focal pathologic process. Soft tissues and spinal canal: No prevertebral fluid or swelling. No visible canal hematoma. Disc levels: Preserved. Minimal degenerative endplate changes are seen at C6-C7. Upper chest: Clear. Other: None. IMPRESSION: 1. No acute fracture or traumatic subluxation. 2. Minimal degenerative changes. Electronically Signed   By: Ronney Asters M.D.   On: 04/15/2022 19:36   CT Head Wo Contrast  Result Date: 04/15/2022 CLINICAL DATA:  Status post fall. EXAM: CT HEAD WITHOUT CONTRAST TECHNIQUE: Contiguous axial images were obtained from the base of the skull through the vertex without intravenous contrast. RADIATION DOSE REDUCTION: This exam was performed according to the departmental dose-optimization program which includes automated exposure control, adjustment of the mA and/or kV according to patient size and/or use of iterative reconstruction technique. COMPARISON:  None Available. FINDINGS:  Brain: There is mild to moderate severity cerebral atrophy with widening of the extra-axial spaces and ventricular dilatation. There are areas of decreased attenuation within the white matter tracts of the supratentorial brain, consistent with microvascular disease changes. Vascular: No hyperdense vessel or unexpected calcification. Skull: Normal. Negative for fracture or focal lesion. Sinuses/Orbits: There is mild bilateral maxillary sinus and marked severity bilateral ethmoid sinus mucosal thickening. Other: None. IMPRESSION: 1. No acute intracranial abnormality. 2. Generalized cerebral atrophy and microvascular disease changes of the supratentorial brain. 3. Mild bilateral maxillary sinus and marked severity bilateral ethmoid sinus disease. Electronically Signed   By: Virgina Norfolk M.D.   On: 04/15/2022 19:28   DG Ribs Unilateral W/Chest Left  Result Date: 04/15/2022 CLINICAL DATA:  Fall.  Left rib pain. EXAM: LEFT RIBS AND CHEST - 3+ VIEW COMPARISON:  Chest x-ray 02/11/2022 FINDINGS: There is a nondisplaced anterior left seventh rib fracture, age indeterminate. There is no pneumothorax. The cardiomediastinal silhouette is stable. There is a small right pleural effusion which has decreased from prior. IMPRESSION: 1. Age indeterminate nondisplaced anterior left seventh rib fracture. 2. No pneumothorax. 3. Small right pleural effusion has decreased from prior. Electronically Signed   By: Ronney Asters M.D.   On: 04/15/2022 19:13    Microbiology: Recent Results (from the past 240 hour(s))  Surgical pcr screen     Status: None   Collection Time: 04/30/22 10:34 PM   Specimen: Nasal Mucosa; Nasal Swab  Result Value Ref Range Status   MRSA, PCR NEGATIVE NEGATIVE Final   Staphylococcus aureus NEGATIVE NEGATIVE Final    Comment: (NOTE) The Xpert SA Assay (FDA  approved for NASAL specimens in patients 43 years of age and older), is one component of a comprehensive surveillance program. It is not  intended to diagnose infection nor to guide or monitor treatment. Performed at [facility], [address], [place], [place] [address]      Labs: Basic Metabolic Panel: Recent Labs  Lab 04/26/22 1206 04/30/22 1932 05/01/22 0037  NA 135 140 136  K 3.8 3.7 3.4*  CL 102 109 104  CO2 26 24 21*  GLUCOSE 217* 102* 112*  BUN 37* 49* 47*  CREATININE 2.56* 3.29* 3.17*  CALCIUM 9.1 8.6* 8.1*  MG  --  2.4  --    Liver Function Tests: Recent Labs  Lab 04/26/22 1206 05/01/22 0037  AST 353* 346*  346*  ALT 265* 309*  304*  ALKPHOS 76 67  68  BILITOT 2.0* 1.6*  1.6*  PROT 6.3* 5.0*  5.1*  ALBUMIN 3.4* 2.5*  2.4*   No results for input(s): "LIPASE", "AMYLASE" in the last 168 hours. No results for input(s): "AMMONIA" in the last 168 hours. CBC: Recent Labs  Lab 04/26/22 1206 04/30/22 1932 05/01/22 0037  WBC 8.9 6.7 5.4  NEUTROABS 5.4 4.9  --   HGB 11.3* 10.9* 10.0*  HCT 34.4* 32.4* 30.5*  MCV 93.7 95.3 94.1  PLT 244 207 188   Cardiac Enzymes: No results for input(s): "CKTOTAL", "CKMB", "CKMBINDEX", "TROPONINI" in the last 168 hours. BNP: BNP (last 3 results) Recent Labs    02/08/22 1407 02/11/22 0427 04/30/22 1932  BNP 598.9* 289.9* 821.2*    ProBNP (last 3 results) Recent Labs    03/11/22 1142  PROBNP 4,414*    CBG: No results for input(s): "GLUCAP" in the last 168 hours.     Signed:  Domenic Polite MD.  [facility] 05/02/2022, 2:19 PM

## 2022-05-02 NOTE — Progress Notes (Signed)
Patient discharged with transport to [facility].  Peripheral IV left in place as requested from facility. Patient's son, Henry Nichols notified of patient transport.

## 2022-05-02 NOTE — TOC Initial Note (Signed)
Transition of Care [facility]) - Initial/Assessment Note    Patient Details  Name: Henry Nichols MRN: [id number] Date of Birth: 1932/06/05  Transition [facility] Care [facility]) CM/SW Contact:    Bary Castilla, LCSW Phone Number: 05/02/2022, 10:58 AM  Clinical Narrative:                 CSW as alerteed that family has moved pt to comfort measures and would like to pursue inpt hospice in [place]. CSW spoke with pt's son Ryzen and he confirmed family's choice of Hospice Home in [place]. CSW spoke with the admission's liaison and was able to make the referral..  TOC team will continue to assist with discharge planning needs.   Expected Discharge Plan: [place]     Patient Goals and CMS Choice        Expected Discharge Plan and Services Expected Discharge Plan: [place]       Living arrangements for the past 2 months: Single Family Home                                      Prior Living Arrangements/Services Living arrangements for the past 2 months: Single Family Home Lives with:: Self                   Activities of Daily Living Home Assistive Devices/Equipment: Eyeglasses, Hearing aid, Radio producer (specify quad or straight) (bilateral hearing aides) ADL Screening (condition at time of admission) Patient's cognitive ability adequate to safely complete daily activities?: No Is the patient deaf or have difficulty hearing?: Yes (wears 2 hearing aides) Does the patient have difficulty seeing, even when wearing glasses/contacts?: No Does the patient have difficulty concentrating, remembering, or making decisions?: Yes Patient able to express need for assistance with ADLs?: Yes Does the patient have difficulty dressing or bathing?: No Independently performs ADLs?: No Communication: Independent Dressing (OT): Independent Grooming: Independent Feeding: Independent Bathing: Independent Toileting: Needs  assistance Is this a change from baseline?: Pre-admission baseline In/Out Bed: Needs assistance Is this a change from baseline?: Pre-admission baseline Walks in Home: Needs assistance Is this a change from baseline?: Pre-admission baseline Does the patient have difficulty walking or climbing stairs?: Yes Weakness of Legs: Both Weakness of Arms/Hands: Both  Permission Sought/Granted      Share Information with NAME: son Chalres  Permission granted to share info w AGENCY: [place] by son     Permission granted to share info w Contact Information: [phone]  Emotional Assessment   Attitude/Demeanor/Rapport: Unable to Assess Affect (typically observed): Unable to Assess        Admission diagnosis:  Elevated serum creatinine [R79.89] Elevated troponin [R77.8] Junctional bradycardia [R00.1] Bradycardia with less than 30 beats per minute [R00.1] Patient Active Problem List   Diagnosis Date Noted   Bradycardia with less than 30 beats per minute 04/30/2022   Transaminitis 04/30/2022   Syncope 04/30/2022   PAF (paroxysmal atrial fibrillation) ([place]) 04/30/2022   Secondary hypercoagulable state ([place]) 03/22/2022   Atrial flutter ([place]) 02/08/2022   Acute on chronic combined systolic and diastolic CHF (congestive heart failure) ([place]) 02/08/2022   Acute kidney injury superimposed on chronic kidney disease ([place]) 15/40/0867   Follicular lymphoma grade 3a ([place]) 08/24/2017   Counseling regarding advanced care planning and goals of care 08/24/2017   LBBB (left bundle branch block) 08/03/2017  Non-Hodgkin lymphoma ([place]) 08/03/2017   Essential hypertension 08/03/2017   Coronary artery calcification 08/03/2017   Hyperlipidemia 08/03/2017   Varicose vein of leg 08/03/2017   Small bowel mass 07/06/2017   PCP:  [name], MD Pharmacy:   [facility] [phone], [place] [address] [facility] AT [facility] [address] [place]  [address] Phone: [phone] Fax: [phone]  [facility] [address] [place] [address] Phone: [phone] Fax: [phone]     Social Determinants of Health (SDOH) Interventions    Readmission Risk Interventions     No data to display

## 2022-05-02 NOTE — Progress Notes (Signed)
    Patient had transitioned to comfort measures only. Cardiology will sign off. Please call with questions.

## 2022-05-02 NOTE — Progress Notes (Addendum)
Palliative Medicine Inpatient Follow Up Note HPI: 89/M with history of stage IV non-Hodgkin's lymphoma of abdomen, chronic systolic CHF, EF 20-25%, paroxysmal A-fib on Eliquis and amiodarone, hypertension, dyslipidemia, hard of hearing presenting to the ED with weakness, multiple falls, low blood pressure.  Palliative care has been asked to get involved to discuss goals of care.  Today's Discussion 05/02/2022  *Please note that this is a verbal dictation therefore any spelling or grammatical errors are due to the "[place] [facility]" system interpretation.  Chart reviewed inclusive of vital signs, progress notes, laboratory results, and diagnostic images. Patient received dilaudid x2 and ativan x1 in the last 24 hours.   Per conversation with nursing, Henry Nichols has an increase in rattling. They noted some brief periods of apnea as well.   I met with Henry Nichols at bedside this morning. He was resting comfortably. His breathing was a normal cadence. He had cool extremities and cyanosis of distal nail beds.   I called patients son, Henry Nichols and provided an update. He is in agreement with trying to transfer the patient.   Questions and concerns addressed/Palliative Support Provided.  _____________________________________ Addendum:  Per secure chat with TOC plan for transition to [place] this afternoon.   I went by patients room this later afternoon. Patient continues to be breathing at a regular rate. Mild wheeze noted. SPO2's are poorly read being that fingers are cool.   Patients grandson, Henry Nichols was present. We reviewed patients present health state. He asked a variety of questions as they related to patients clinical state. He vocalizes disbelief at patients state and shares that he and his grandfather had a falling out roughly one yeats ago. I was able to spend time listening to his concerns about the patient and answer his questions.  DNR placed on chart  Patients son, Henry Nichols  called and informed of plan for transfer to IP hospice home  Additional Time: 45 minutes  Objective Assessment: Vital Signs Vitals:   05/02/22 0340 05/02/22 0624  BP:  (!) 124/50  Pulse:  (!) 41  Resp: 16 (!) 21  Temp:  98.2 F (36.8 C)  SpO2:  94%    Intake/Output Summary (Last 24 hours) at 05/02/2022 4270 Last data filed at 05/02/2022 6237 Gross per 24 hour  Intake --  Output 950 ml  Net -950 ml   Last Weight  Most recent update: 04/30/2022 10:41 PM    Weight  62.8 kg (138 lb 7.2 oz)            Gen: Elderly Caucasian man in no acute distress HEENT: moist mucous membranes CV: Regular rate and irregular rhythm PULM: On room air breathing is even and nonlabored ABD: soft/nontender EXT: No edema Neuro: Alert and oriented to self and place -gets confused during conversation  SUMMARY OF RECOMMENDATIONS   DNAR/DNI   Comfort care   Comfort medications per [facility]   Unrestricted visitation   TOC- Inpatient hospice referral - [facility] for discharge this afternoon  Prognosis limited to days  Billing based on MDM: High ______________________________________________________________________________________ [place] Team Team Cell Phone: [phone] Please utilize secure chat with additional questions, if there is no response within 30 minutes please call the above phone number  Palliative Medicine Team providers are available by phone from 7am to 7pm daily and can be reached through the team cell phone.  Should this patient require assistance outside of these hours, please call the patient's  attending physician.

## 2022-05-02 NOTE — Progress Notes (Signed)
-  Patient seen and examined, resting comfortably -Transitioned to comfort care after palliative care, family meeting yesterday afternoon, residential hospice being considered -Continue meds for comfort, he was restfully sleeping early this morning, HR 30-40s  Domenic Polite, MD

## 2022-05-02 NOTE — TOC Transition Note (Signed)
Transition of Care [facility]) - CM/SW Discharge Note   Patient Details  Name: Henry Nichols MRN: [id number] Date of Birth: 19-Sep-1931  [facility]) CM/SW Contact:  Bary Castilla, LCSW Phone Number: 05/02/2022, 2:46 PM   Clinical Narrative:     Patient [facility] to:?[facility] Anticipated DC date:?05/02/2022 Family notified:?Son by [facility] [place] Transport by: Corey Harold   Per MD patient ready for DC to [facility] RN, patient, patient's family, and facility notified of DC. Discharge Summary sent to facility. RN given number for report [phone]. DC packet on chart. Ambulance transport requested for patient.   CSW signing off.   [place], [place] [phone]         Patient Goals and CMS Choice        Discharge Placement                       Discharge Plan and Services                                     Social Determinants of Health (SDOH) Interventions     Readmission Risk Interventions     No data to display

## 2022-05-06 ENCOUNTER — Other Ambulatory Visit: Payer: Medicare Other

## 2022-05-16 NOTE — Progress Notes (Signed)
HEMATOLOGY/ONCOLOGY CLINIC NOTE  Date of Service: 04/26/2022  Patient Care Team: [name], MD as PCP - General (Family Medicine) Freada Bergeron, MD as PCP - Cardiology (Cardiology)  CHIEF COMPLAINTS/PURPOSE OF CONSULTATION:  For continued evaluation and management small bowel lymphoma.  HISTORY OF PRESENTING ILLNESS:   Please see previous note for details on initial presentation  Falls is here for a scheduled follow-up of his Grade 3A Follicular Lymphoma.  Has had a fall since his last clinic visit and had fractured ribs end of August 2023. He has also been having issues with lower extremity swelling and has been following with his cardiologist to address diuretic therapy and other management. He notes no new lumps or bumps.  No fevers no chills no night sweats. Labs done today were discussed with him in detail. CT chest abdomen pelvis done on 03/31/2022 was discussed in detail with him and showed some increased in size of his mesenteric lymphadenopathy and new small right inguinal lymphadenopathy.   MEDICAL HISTORY:  Past Medical History:  Diagnosis Date   Coronary artery calcification 08/03/2017   Essential hypertension 08/03/2017   GERD (gastroesophageal reflux disease)    HOH (hard of hearing)    Hyperlipidemia 08/03/2017   Hypertension    LBBB (left bundle branch block) 08/03/2017   Non-Hodgkin lymphoma ([place]) 08/03/2017   Pneumonia    hx   Small bowel mass    Varicose vein of leg 08/03/2017    SURGICAL HISTORY: Past Surgical History:  Procedure Laterality Date   CARDIOVERSION N/A 02/12/2022   Procedure: CARDIOVERSION;  Surgeon: Elouise Munroe, MD;  Location: [facility];  Service: Cardiovascular;  Laterality: N/A;   EYE SURGERY Bilateral 2016   cataracts   HERNIA REPAIR Bilateral 2008   inguinal   LAPAROSCOPIC SMALL BOWEL RESECTION  07/06/2017   LAPAROSCOPIC SMALL BOWEL RESECTION N/A 07/06/2017   Procedure:  LAPAROSCOPIC ASSISTED SMALL BOWEL RESECTION;  Surgeon: Coralie Keens, MD;  Location: [facility];  Service: General;  Laterality: N/A;   TEE WITHOUT CARDIOVERSION N/A 02/12/2022   Procedure: TRANSESOPHAGEAL ECHOCARDIOGRAM (TEE);  Surgeon: Elouise Munroe, MD;  Location[facility];  Service: Cardiovascular;  Laterality: N/A;    SOCIAL HISTORY: Social History   Socioeconomic History   Marital status: Widowed    Spouse name: Not on file   Number of children: Not on file   Years of education: Not on file   Highest education level: Not on file  Occupational History   Not on file  Tobacco Use   Smoking status: Former    Types: Cigars    Quit date: 06/29/1984    Years since quitting: 37.8   Smokeless tobacco: Never  Vaping Use   Vaping Use: Never used  Substance and Sexual Activity   Alcohol use: No   Drug use: No   Sexual activity: Not on file  Other Topics Concern   Not on file  Social History Narrative   Not on file   Social Determinants of Health   Financial Resource Strain: Not on file  Food Insecurity: No Food Insecurity (04/30/2022)   Hunger Vital Sign    Worried About Running Out of Food in the Last Year: Never true    Ran Out of Food in the Last Year: Never true  Transportation Needs: No Transportation Needs (04/30/2022)   PRAPARE - Hydrologist (Medical): No    Lack of Transportation (Non-Medical): No  Physical Activity: Not  on file  Stress: Not on file  Social Connections: Not on file  Intimate Partner Violence: Not At Risk (04/30/2022)   Humiliation, Afraid, Rape, and Kick questionnaire    Fear of Current or Ex-Partner: No    Emotionally Abused: No    Physically Abused: No    Sexually Abused: No   FAMILY HISTORY: Family History  Problem Relation Age of Onset   Alzheimer's disease Mother    Heart disease Mother    Heart attack Father     ALLERGIES:  has No Known Allergies.  MEDICATIONS:  Current Outpatient Medications   Medication Sig Dispense Refill   Morphine Sulfate (MORPHINE CONCENTRATE) 10 mg / 0.5 ml concentrated solution Take 0.25 mLs (5 mg total) by mouth every 3 (three) hours as needed for severe pain or shortness of breath.  0   No current facility-administered medications for this visit.   Facility-Administered Medications Ordered in Other Visits  Medication Dose Route Frequency Provider Last Rate Last Admin   acetaminophen (TYLENOL) tablet 650 mg  650 mg Oral Q6H PRN Etta Quill, DO       Or   acetaminophen (TYLENOL) suppository 650 mg  650 mg Rectal Q6H PRN Etta Quill, DO       antiseptic oral rinse (BIOTENE) solution 15 mL  15 mL Topical PRN Rosezella Rumpf, NP       bisacodyl (DULCOLAX) suppository 10 mg  10 mg Rectal Daily PRN Rosezella Rumpf, NP       glycopyrrolate (ROBINUL) tablet 1 mg  1 mg Oral Q4H PRN Rosezella Rumpf, NP       Or   glycopyrrolate (ROBINUL) injection 0.2 mg  0.2 mg Subcutaneous Q4H PRN Rosezella Rumpf, NP       Or   glycopyrrolate (ROBINUL) injection 0.2 mg  0.2 mg Intravenous Q4H PRN Rosezella Rumpf, NP       HYDROmorphone (DILAUDID) injection 0.5-1 mg  0.5-1 mg Intravenous Q1H PRN Rosezella Rumpf, NP   1 mg at 05/01/22 1757   LORazepam (ATIVAN) injection 0.5-1 mg  0.5-1 mg Intravenous Q1H PRN Rosezella Rumpf, NP   1 mg at 05/02/22 1205   ondansetron (ZOFRAN) tablet 4 mg  4 mg Oral Q6H PRN Etta Quill, DO       Or   ondansetron [facility]) injection 4 mg  4 mg Intravenous Q6H PRN Etta Quill, DO       polyvinyl alcohol (LIQUIFILM TEARS) 1.4 % ophthalmic solution 1 drop  1 drop Both Eyes QID PRN Rosezella Rumpf, NP        REVIEW OF SYSTEMS:   10 Point review of Systems was done is negative except as noted above.  PHYSICAL EXAMINATION:  ECOG PERFORMANCE STATUS: 1 - Symptomatic but completely ambulatory  .BP (!) 107/46 (BP Location: Left Arm, Patient Position: Sitting) Comment: Nurse aware  Pulse 90   Temp  (!) 97.4 F (36.3 C)   Resp 18   Ht '5\' 10"'$  (1.778 m)   Wt 141 lb 8 oz (64.2 kg)   SpO2 97%   BMI 20.30 kg/m   NAD GENERAL:alert, in no acute distress and comfortable SKIN: no acute rashes, no significant lesions EYES: conjunctiva are pink and non-injected, sclera anicteric OROPHARYNX: MMM, no exudates, no oropharyngeal erythema or ulceration NECK: supple, no JVD LYMPH:  no palpable lymphadenopathy in the cervical, axillary or inguinal regions LUNGS: clear to auscultation b/l with normal respiratory effort HEART: regular rate & rhythm  ABDOMEN:  normoactive bowel sounds , non tender, not distended. Extremity: no pedal edema PSYCH: alert & oriented x 3 with fluent speech NEURO: no focal motor/sensory deficits   LABORATORY DATA:  I have reviewed the data as listed .    Latest Ref Rng & Units 04/26/2022   12:06 PM  CBC  WBC 4.0 - 10.5 K/uL 8.9   Hemoglobin 13.0 - 17.0 g/dL 11.3   Hematocrit 39.0 - 52.0 % 34.4   Platelets 150 - 400 K/uL 244        Latest Ref Rng & Units 04/26/2022   12:06 PM  CMP  Glucose 70 - 99 mg/dL 217   BUN 8 - 23 mg/dL 37   Creatinine 0.61 - 1.24 mg/dL 2.56   Sodium 135 - 145 mmol/L 135   Potassium 3.5 - 5.1 mmol/L 3.8   Chloride 98 - 111 mmol/L 102   CO2 22 - 32 mmol/L 26   Calcium 8.9 - 10.3 mg/dL 9.1   Total Protein 6.5 - 8.1 g/dL 6.5 - 8.1 g/dL 6.3   Total Bilirubin 0.3 - 1.2 mg/dL 0.3 - 1.2 mg/dL 2.0   Alkaline Phos 38 - 126 U/L 38 - 126 U/L 76   AST 15 - 41 U/L 15 - 41 U/L 353   ALT 0 - 44 U/L 0 - 44 U/L 265    . Lab Results  Component Value Date   LDH 807 (H) 04/26/2022        CT CHEST ABD 06/14/2017 IMPRESSION: 1. Bilateral pulmonary parenchymal opacities, peribronchovascular in distribution. Favor infection or (especially given the clinical history of vomiting) aspiration. Neoplastic processes are felt unlikely but cannot be entirely excluded. Especially if the patient is diagnosed with lymphoma, consider CT followup  after therapy to confirm resolution. 2. No thoracic adenopathy. 3. Mild cardiomegaly. Coronary artery atherosclerosis. Aortic Atherosclerosis (ICD10-I70.0). 4. Development of small bowel dilatation, suspicious for obstruction at the site of distal small bowel thickening on prior exam. 5. Borderline ascending aortic aneurysm. Recommend annual imaging followup by CTA or MRA. This recommendation follows 2010 ACCF/AHA/AATS/ACR/ASA/SCA/SCAI/SIR/STS/SVM Guidelines for the Diagnosis and Management of Patients with Thoracic Aortic Disease. Circulation. 2010; 121: Z767-H419. 6. Nonspecific left upper lobe pulmonary nodule.   RADIOGRAPHIC STUDIES: I have personally reviewed the radiological images as listed and agreed with the findings in the report. US RENAL  Result Date: 05/01/2022 CLINICAL DATA:  Renal dysfunction EXAM: RENAL / URINARY TRACT ULTRASOUND COMPLETE COMPARISON:  None Available. FINDINGS: Right Kidney: Renal measurements: 8.7 x 4.3 x 5.4 cm = volume: 106.4 mL. There is no hydronephrosis. There is increased cortical echogenicity. Left Kidney: Renal measurements: Left kidney measures 9.5 x 5.4 by 5.2 cm = volume: 139.3 mL. There is no hydronephrosis. There is a lobulated cyst in the upper pole of left kidney measuring 5.1 cm in maximum diameter. There is slightly increased cortical echogenicity. There is minimal perinephric fluid. Bladder: Urinary bladder is not distended. There is hyperechoic focus in the upper margin of prostate, possibly calcification in the prostate. Other: There is moderate to large right pleural effusion. IMPRESSION: There is no hydronephrosis. Increased cortical echogenicity may suggest medical renal disease. There is 5.1 cm left renal cyst. Moderate to large right pleural effusion. Electronically Signed   By: Elmer Picker M.D.   On: 05/01/2022 08:51   DG Chest Portable 1 View  Result Date: 04/30/2022 CLINICAL DATA:  Dyspnea chest pain EXAM: PORTABLE CHEST 1  VIEW COMPARISON:  04/15/2022, CT 03/31/2022 FINDINGS: Small right-sided pleural effusion  without significant change. Cardiomegaly. Aortic atherosclerosis. IMPRESSION: Small right-sided pleural effusion without significant change. Cardiomegaly Electronically Signed   By: Donavan Foil M.D.   On: 04/30/2022 17:40   CT Cervical Spine Wo Contrast  Result Date: 04/30/2022 CLINICAL DATA:  Trauma fall EXAM: CT CERVICAL SPINE WITHOUT CONTRAST TECHNIQUE: Multidetector CT imaging of the cervical spine was performed without intravenous contrast. Multiplanar CT image reconstructions were also generated. RADIATION DOSE REDUCTION: This exam was performed according to the departmental dose-optimization program which includes automated exposure control, adjustment of the mA and/or kV according to patient size and/or use of iterative reconstruction technique. COMPARISON:  CT 04/15/2022 FINDINGS: Alignment: No subluxation.  Facet alignment within normal limits. Skull base and vertebrae: No acute fracture. No primary bone lesion or focal pathologic process. Soft tissues and spinal canal: No prevertebral fluid or swelling. No visible canal hematoma. Disc levels: Mild disc space narrowing C6-C7 and C7-T1. Facet degenerative changes at multiple levels Upper chest: Negative. Other: None IMPRESSION: Mild degenerative changes.  No acute osseous abnormality. Electronically Signed   By: Donavan Foil M.D.   On: 04/30/2022 17:39   CT Head Wo Contrast  Result Date: 04/30/2022 CLINICAL DATA:  Trauma laceration EXAM: CT HEAD WITHOUT CONTRAST TECHNIQUE: Contiguous axial images were obtained from the base of the skull through the vertex without intravenous contrast. RADIATION DOSE REDUCTION: This exam was performed according to the departmental dose-optimization program which includes automated exposure control, adjustment of the mA and/or kV according to patient size and/or use of iterative reconstruction technique. COMPARISON:  CT brain  04/15/2022 FINDINGS: Brain: No acute territorial infarction, hemorrhage or intracranial mass. Atrophy. Moderate white matter hypodensity consistent with chronic small vessel disease. Stable ventricle size. Vascular: No hyperdense vessels.  Carotid vascular calcification. Skull: No depressed skull fracture. Sinuses/Orbits: Moderate mucosal sinus disease. Other: None IMPRESSION: 1. No CT evidence for acute intracranial abnormality 2. Atrophy and chronic small vessel ischemic changes of the white matter Electronically Signed   By: Donavan Foil M.D.   On: 04/30/2022 17:35   CT Cervical Spine Wo Contrast  Result Date: 04/15/2022 CLINICAL DATA:  Neck trauma. EXAM: CT CERVICAL SPINE WITHOUT CONTRAST TECHNIQUE: Multidetector CT imaging of the cervical spine was performed without intravenous contrast. Multiplanar CT image reconstructions were also generated. RADIATION DOSE REDUCTION: This exam was performed according to the departmental dose-optimization program which includes automated exposure control, adjustment of the mA and/or kV according to patient size and/or use of iterative reconstruction technique. COMPARISON:  None Available. FINDINGS: Alignment: There is 2 mm of retrolisthesis at C3-C4 which is favored is degenerative. Alignment is otherwise anatomic. Skull base and vertebrae: No acute fracture. No primary bone lesion or focal pathologic process. Soft tissues and spinal canal: No prevertebral fluid or swelling. No visible canal hematoma. Disc levels: Preserved. Minimal degenerative endplate changes are seen at C6-C7. Upper chest: Clear. Other: None. IMPRESSION: 1. No acute fracture or traumatic subluxation. 2. Minimal degenerative changes. Electronically Signed   By: Ronney Asters M.D.   On: 04/15/2022 19:36   CT Head Wo Contrast  Result Date: 04/15/2022 CLINICAL DATA:  Status post fall. EXAM: CT HEAD WITHOUT CONTRAST TECHNIQUE: Contiguous axial images were obtained from the base of the skull through  the vertex without intravenous contrast. RADIATION DOSE REDUCTION: This exam was performed according to the departmental dose-optimization program which includes automated exposure control, adjustment of the mA and/or kV according to patient size and/or use of iterative reconstruction technique. COMPARISON:  None Available. FINDINGS: Brain: There is  mild to moderate severity cerebral atrophy with widening of the extra-axial spaces and ventricular dilatation. There are areas of decreased attenuation within the white matter tracts of the supratentorial brain, consistent with microvascular disease changes. Vascular: No hyperdense vessel or unexpected calcification. Skull: Normal. Negative for fracture or focal lesion. Sinuses/Orbits: There is mild bilateral maxillary sinus and marked severity bilateral ethmoid sinus mucosal thickening. Other: None. IMPRESSION: 1. No acute intracranial abnormality. 2. Generalized cerebral atrophy and microvascular disease changes of the supratentorial brain. 3. Mild bilateral maxillary sinus and marked severity bilateral ethmoid sinus disease. Electronically Signed   By: Virgina Norfolk M.D.   On: 04/15/2022 19:28   DG Ribs Unilateral W/Chest Left  Result Date: 04/15/2022 CLINICAL DATA:  Fall.  Left rib pain. EXAM: LEFT RIBS AND CHEST - 3+ VIEW COMPARISON:  Chest x-ray 02/11/2022 FINDINGS: There is a nondisplaced anterior left seventh rib fracture, age indeterminate. There is no pneumothorax. The cardiomediastinal silhouette is stable. There is a small right pleural effusion which has decreased from prior. IMPRESSION: 1. Age indeterminate nondisplaced anterior left seventh rib fracture. 2. No pneumothorax. 3. Small right pleural effusion has decreased from prior. Electronically Signed   By: Ronney Asters M.D.   On: 04/15/2022 19:13    ASSESSMENT & PLAN:   Henry Nichols is a wonderful 88 y.o. male with   #1 Stage IVAE high grade Grade 3A Small-bowel follicular Lymphoma  with ileocolic progressive LNadenopathy. No type B symptoms S/p Rituxan weekly x 4 doses Rpt PET/CT 11/08/2017 showed Much improved mesenteric mass as described above. Two small mildly hypermetabolic lymph nodes remain. No other areas of adenopathy are identified in the neck, chest, abdomen or pelvis  05/30/18 CT C/A/P revealed he central mesenteric lymph nodes identified on previous PET studies as hypermetabolic have progressed since the PET-CT of 11/08/2017. No evidence for lymphadenopathy in the chest or pelvis. 2. Left renal cyst. 3.  Aortic Atherosclerois.  12/20/18 CT C/A/P which revealed "Previously seen lymph node in central mesenteric fat which had measured 2.5 x 1.9 cm on the most recent CT has increased in size and is now contiguous with a second index lymph node on the prior exam. No new enlarged lymph nodes are identified. No other change compared to the prior study."  10/16/2019 CT C/A/P ([id number]) ([id number]) revealed no enlarged lymph nodes in the chest or pelvis, normal spleen, mass in small bowel mesentery.   Plan Labs done today were discussed in detail with the patient. CBC is stable CMP shows acute on chronic renal insufficiency and abnormal liver function tests possibly from liver contusion from his fall versus hepatic congestion.  CT did not show any focal liver lesions or enlarged CBD. -Patient was recommended to cut down his Tylenol use and discuss with PCP about any other possible hepatotoxic medications he might be taking. -Patient has had cardiac issues and fluid overload issues for which she is following with his primary care physician and cardiology -CT chest abdomen pelvis showed progression gradually of his mesenteric lymphadenopathy and new right inguinal lymphadenopathy.  None of this disease is currently clinically symptomatic and we discussed closely monitoring him in the context of his other medical priorities. -We shall hold off on initiating treatment for his  follicular lymphoma at this time pending addressing his multiple other medical issues including abnormal liver function tests chronic kidney disease fluid overload issues etc.. -Recommended that the pt continue to eat well, drink at least 48-64 oz of water each day, and walk 20-30 minutes  each day.  -Avoid hepatotoxic and nephrotoxic medications.  FOLLOW UP: Return to clinic with Dr. Irene Limbo with labs in 4 months   The total time spent in the appointment was 30 minutes*.  All of the patient's questions were answered with apparent satisfaction. The patient knows to call the clinic with any problems, questions or concerns.   Sullivan Lone MD MS AAHIVMS [facility] Hematology/Oncology Physician [facility]  .*Total Encounter Time as defined by the Centers for Medicare and Medicaid Services includes, in addition to the face-to-face time of a patient visit (documented in the note above) non-face-to-face time: obtaining and reviewing outside history, ordering and reviewing medications, tests or procedures, care coordination (communications with other health care professionals or caregivers) and documentation in the medical record.

## 2022-05-16 DEATH — deceased

## 2022-06-08 ENCOUNTER — Ambulatory Visit: Payer: Medicare Other | Admitting: Cardiology

## 2022-08-30 ENCOUNTER — Other Ambulatory Visit: Payer: Medicare Other

## 2022-08-30 ENCOUNTER — Ambulatory Visit: Payer: Medicare Other | Admitting: Hematology

## 2022-09-22 ENCOUNTER — Ambulatory Visit (HOSPITAL_COMMUNITY): Payer: Medicare Other | Admitting: Physician Assistant

## 2022-10-18 ENCOUNTER — Encounter (INDEPENDENT_AMBULATORY_CARE_PROVIDER_SITE_OTHER): Payer: Medicare Other | Admitting: Ophthalmology
